# Patient Record
Sex: Female | Born: 1965 | Race: White | Hispanic: No | Marital: Single | Smoking: Never smoker
Health system: Southern US, Community
[De-identification: ages and names within clinical notes are randomized; demographics above are authoritative.]

## PROBLEM LIST (undated history)

## (undated) DIAGNOSIS — Z8719 Personal history of other diseases of the digestive system: Secondary | ICD-10-CM

## (undated) DIAGNOSIS — R0602 Shortness of breath: Secondary | ICD-10-CM

## (undated) DIAGNOSIS — T4145XA Adverse effect of unspecified anesthetic, initial encounter: Secondary | ICD-10-CM

## (undated) DIAGNOSIS — F329 Major depressive disorder, single episode, unspecified: Secondary | ICD-10-CM

## (undated) DIAGNOSIS — G4730 Sleep apnea, unspecified: Secondary | ICD-10-CM

## (undated) DIAGNOSIS — F32A Depression, unspecified: Secondary | ICD-10-CM

## (undated) DIAGNOSIS — Z87442 Personal history of urinary calculi: Secondary | ICD-10-CM

## (undated) DIAGNOSIS — R011 Cardiac murmur, unspecified: Secondary | ICD-10-CM

## (undated) DIAGNOSIS — R112 Nausea with vomiting, unspecified: Secondary | ICD-10-CM

## (undated) DIAGNOSIS — K219 Gastro-esophageal reflux disease without esophagitis: Secondary | ICD-10-CM

## (undated) DIAGNOSIS — N189 Chronic kidney disease, unspecified: Secondary | ICD-10-CM

## (undated) DIAGNOSIS — E119 Type 2 diabetes mellitus without complications: Secondary | ICD-10-CM

## (undated) DIAGNOSIS — I10 Essential (primary) hypertension: Secondary | ICD-10-CM

## (undated) DIAGNOSIS — J45909 Unspecified asthma, uncomplicated: Secondary | ICD-10-CM

## (undated) DIAGNOSIS — D649 Anemia, unspecified: Secondary | ICD-10-CM

## (undated) DIAGNOSIS — Z9889 Other specified postprocedural states: Secondary | ICD-10-CM

## (undated) DIAGNOSIS — M1990 Unspecified osteoarthritis, unspecified site: Secondary | ICD-10-CM

## (undated) DIAGNOSIS — T8859XA Other complications of anesthesia, initial encounter: Secondary | ICD-10-CM

## (undated) DIAGNOSIS — F419 Anxiety disorder, unspecified: Secondary | ICD-10-CM

## (undated) HISTORY — PX: DILATION AND CURETTAGE OF UTERUS: SHX78

## (undated) HISTORY — PX: SHOULDER ARTHROSCOPY WITH ROTATOR CUFF REPAIR: SHX5685

## (undated) HISTORY — PX: BREAST SURGERY: SHX581

## (undated) HISTORY — PX: NISSEN FUNDOPLICATION: SHX2091

## (undated) HISTORY — PX: OTHER SURGICAL HISTORY: SHX169

## (undated) HISTORY — PX: CATARACT EXTRACTION: SUR2

## (undated) HISTORY — PX: ABDOMINAL HYSTERECTOMY: SHX81

## (undated) HISTORY — PX: WRIST SURGERY: SHX841

## (undated) HISTORY — PX: NASAL SINUS SURGERY: SHX719

---

## 2011-11-15 HISTORY — PX: SEPTOPLASTY: SUR1290

## 2013-10-14 NOTE — H&P (Signed)
History of Present Illness The patient is a 47 year old female who presents today for follow up of their back. The patient is being followed for their back pain. They are now 2 week(s) out from bilateral L3, L4 med branch block. Symptoms reported today include: pain, aching and weakness, while the patient does not report symptoms of: urinary incontinence. The patient states that they are doing poorly. The following medication has been used for pain control: none. The patient reports their current pain level to be 7 / 10. The patient has not gotten any relief of their symptoms with Cortisone injections.   Subjective Transcription  She returns today for follow up. The patient has had no significant relief of her symptoms with the recent medial branch bundle block. The only thing that gave her temporary relief was the facet injection done in September 2014. She continues to have severe extension related back pain and it is now beginning to affect her forward flexion. She has increasing left radicular neuropathic leg pain compared to the right.  Allergies Codeine Phosphate *ANALGESICS - OPIOID*    Social History Tobacco / smoke exposure. no Pain Contract. no Drug/Alcohol Rehab (Previously). no Tobacco use. Never smoker. never smoker Alcohol use. never consumed alcohol Children. 0 Exercise. Exercises rarely; does running / walking Number of flights of stairs before winded. greater than 5 Illicit drug use. no Living situation. live alone Current work status. working full time Drug/Alcohol Rehab (Currently). no Marital status. divorced    Medication History Wellbutrin XL (300MG  Tablet ER 24HR, Oral) Active. (qd) Clorazepate Dipotassium (15MG  Tablet, Oral) Active. (qd) Ambien ( Oral) Specific dose unknown - Active. Xanax ( Oral) Specific dose unknown - Active. Losartan Potassium ( Oral) Specific dose unknown - Active. (qd) Spironolactone-HCTZ ( Oral)  Specific dose unknown - Active. (qd) Nasacort ( Nasal) Specific dose unknown - Active. (prn) Furosemide ( Oral) Specific dose unknown - Active. (bid) Domperidone BP Specific dose unknown - Active. (bid) Zofran ( Oral) Specific dose unknown - Active. (2-3 qd prn) Glimepiride (2MG  Tablet, Oral) Active. (bid) Atenolol (50MG  Tablet, Oral) Active. (qd) Pepcid ( Oral) Specific dose unknown - Active. (qhs) Omeprazole (40MG  Capsule DR, Oral) Active. (qd) MetFORMIN HCl (500MG  Tablet, Oral) Active. (tid) Potassium Chloride ER ( Capsule ER, Oral) Active. (qd) OLANZapine (2.5MG  Tablet, Oral) Active. (qhs) Cymbalta (60MG  Capsule DR Part, Oral) Active. (qd) Singulair (10MG  Tablet, Oral) Active. (qd) Norvasc (10MG  Tablet, Oral) Active. (qd) Zantac (300MG  Tablet, Oral) Active. (qd) Medications Reconciled.    Past Surgical History Breast Biopsy. right, multiple times Other Orthopaedic Surgery    Other Problems High blood pressure Depression Other disease, cancer, significant illness Anxiety Disorder    Objective Transcription  She is alert. She is oriented times three. No shortness of breath or chest pain. The abdomen is soft, nontender. No history of incontinence of bowel or bladder. No hip, knee or ankle pain with joint range of motion. Negative SI joint tenderness. Negative Patrick's sign. She has exquisite pain in the mid portion of the low back especially with extension of the spine.    RADIOGRAPHS:  I have gone over her MRI from August again. 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 She has mild degenerative disease at L3-4 and L4-5, no evidence of significant collapse of the discs. There is also lateral recess stenosis secondary to the facet arthrosis left side again is more prominent than the right.     Assessments Transcription  At this point  in time the patient has had physical therapy, injection therapy, activity  modification, narcotic medications and her quality of life continues to deteriorate. She has had this ongoing problem now for almost five years and it has gotten progressively worse. At this point in time given the failure of conservative management I think it is reasonable to proceed with a facetectomy and decompression of that left side. This will allow debulking of the degenerated facet complex. I hesitate to recommend a fusion given the tall disc space. In the future she may require revision fusion but at this point I think by decompressing that lateral recess and taking down the majority of that facet joint so that it is not as symptomatic may be of benefit. I have discussed the risks with the patient which include infection, bleeding, nerve damage, death, stroke, paralysis, failure to heal, need for further surgery and ongoing or worse pain, loss of bowel and bladder control, CSF leak, need for fusion surgery in the future, no relief of symptoms. She is in agreement the risks and benefits and she would like to proceed with surgery. I will go ahead and set this up at her request.

## 2013-10-18 ENCOUNTER — Encounter (HOSPITAL_COMMUNITY): Payer: Self-pay | Admitting: Pharmacist

## 2013-10-21 ENCOUNTER — Encounter (HOSPITAL_COMMUNITY): Payer: Self-pay

## 2013-10-21 ENCOUNTER — Encounter (HOSPITAL_COMMUNITY)
Admission: RE | Admit: 2013-10-21 | Discharge: 2013-10-21 | Disposition: A | Discharge status: Home / Self Care | Payer: BC Managed Care – PPO | Source: Ambulatory Visit | Attending: Orthopedic Surgery | Admitting: Orthopedic Surgery

## 2013-10-21 HISTORY — DX: Depression, unspecified: F32.A

## 2013-10-21 HISTORY — DX: Essential (primary) hypertension: I10

## 2013-10-21 HISTORY — DX: Adverse effect of unspecified anesthetic, initial encounter: T41.45XA

## 2013-10-21 HISTORY — DX: Chronic kidney disease, unspecified: N18.9

## 2013-10-21 HISTORY — DX: Major depressive disorder, single episode, unspecified: F32.9

## 2013-10-21 HISTORY — DX: Other complications of anesthesia, initial encounter: T88.59XA

## 2013-10-21 HISTORY — DX: Cardiac murmur, unspecified: R01.1

## 2013-10-21 HISTORY — DX: Shortness of breath: R06.02

## 2013-10-21 HISTORY — DX: Sleep apnea, unspecified: G47.30

## 2013-10-21 HISTORY — DX: Anxiety disorder, unspecified: F41.9

## 2013-10-21 HISTORY — DX: Other specified postprocedural states: Z98.890

## 2013-10-21 HISTORY — DX: Gastro-esophageal reflux disease without esophagitis: K21.9

## 2013-10-21 HISTORY — DX: Nausea with vomiting, unspecified: R11.2

## 2013-10-21 LAB — SURGICAL PCR SCREEN: MRSA, PCR: POSITIVE — AB

## 2013-10-21 LAB — CBC
HCT: 39.8 % (ref 36.0–46.0)
Hemoglobin: 13.3 g/dL (ref 12.0–15.0)
MCHC: 33.4 g/dL (ref 30.0–36.0)
Platelets: 382 10*3/uL (ref 150–400)
RBC: 4.72 MIL/uL (ref 3.87–5.11)

## 2013-10-21 LAB — BASIC METABOLIC PANEL
BUN: 8 mg/dL (ref 6–23)
Calcium: 9.2 mg/dL (ref 8.4–10.5)
GFR calc Af Amer: >90 mL/min (ref >90)
GFR calc non Af Amer: 80 mL/min — ABNORMAL LOW (ref >90)
Potassium: 3.7 mEq/L (ref 3.5–5.1)
Sodium: 136 mEq/L (ref 135–145)

## 2013-10-21 NOTE — Pre-Procedure Instructions (Signed)
Rickeya Manus  10/21/2013   Your procedure is scheduled on:  Wednesday, December 10th.  Report to [facility], Main Entrance/ntrance "A" at 8:00  Call this number if you have problems the morning of surgery: [phone]   Remember:   Do not eat food or drink liquids after midnight Tuesday.  Take these medicines the morning of surgery with A SIP OF WATER: Wellbutrin, Cymbalta, Omeprazole.  May take Zofran if needed.   Do not wear jewelry, make-up or nail polish.  Do not wear lotions, powders, or perfumes. You may wear deodorant.  Do not shave 48 hours prior to surgery.   Do not bring valuables to the hospital.  [facility] is not responsible  for any belongings or valuables.               Contacts, dentures or bridgework may not be worn into surgery.  Leave suitcase in the car. After surgery it may be brought to your room.  For patients admitted to the hospital, discharge time is determined by your  treatment team.               Patients discharged the day of surgery will not be allowed to drive home.  Name and phone number of your driver: -   Special Instructions: Shower using CHG 2 nights before surgery and the night before surgery.  If you shower the day of surgery use CHG.  Use special wash - you have one bottle of CHG for all showers.  You should use approximately 1/3 of the bottle for each shower.   Please read over the following fact sheets that you were given: Pain Booklet, Coughing and Deep Breathing and Surgical Site Infection Prevention

## 2013-10-21 NOTE — Progress Notes (Signed)
Pt has a heart murmer and is followed by Dr Earlene Plater in [place].Pt has sleep apnea and is followed by [facility].  Pt has IGA nephro and see [facility].  I faxed a request for office notes , labs, xrays and cardiac studies.

## 2013-10-22 MED ORDER — CEFAZOLIN SODIUM-DEXTROSE 2-3 GM-% IV SOLR
2.0000 g | INTRAVENOUS | Status: AC
  Administered 2013-10-23: 2 g via INTRAVENOUS

## 2013-10-23 ENCOUNTER — Ambulatory Visit (HOSPITAL_COMMUNITY): Payer: BC Managed Care – PPO

## 2013-10-23 ENCOUNTER — Encounter (HOSPITAL_COMMUNITY)
Admission: RE | Disposition: A | Discharge status: Home / Self Care | Payer: Self-pay | Source: Ambulatory Visit | Attending: Orthopedic Surgery

## 2013-10-23 ENCOUNTER — Ambulatory Visit (HOSPITAL_COMMUNITY): Payer: BC Managed Care – PPO | Admitting: Anesthesiology

## 2013-10-23 ENCOUNTER — Encounter (HOSPITAL_COMMUNITY): Payer: Self-pay | Admitting: *Deleted

## 2013-10-23 ENCOUNTER — Encounter (HOSPITAL_COMMUNITY): Payer: BC Managed Care – PPO | Admitting: Anesthesiology

## 2013-10-23 ENCOUNTER — Observation Stay (HOSPITAL_COMMUNITY)
Admission: RE | Admit: 2013-10-23 | Discharge: 2013-10-24 | Disposition: A | Discharge status: Home / Self Care | Payer: BC Managed Care – PPO | Source: Ambulatory Visit | Attending: Orthopedic Surgery | Admitting: Orthopedic Surgery

## 2013-10-23 DIAGNOSIS — I129 Hypertensive chronic kidney disease with stage 1 through stage 4 chronic kidney disease, or unspecified chronic kidney disease: Secondary | ICD-10-CM | POA: Insufficient documentation

## 2013-10-23 DIAGNOSIS — N189 Chronic kidney disease, unspecified: Secondary | ICD-10-CM | POA: Insufficient documentation

## 2013-10-23 DIAGNOSIS — M549 Dorsalgia, unspecified: Secondary | ICD-10-CM | POA: Diagnosis present

## 2013-10-23 DIAGNOSIS — G4730 Sleep apnea, unspecified: Secondary | ICD-10-CM | POA: Insufficient documentation

## 2013-10-23 DIAGNOSIS — Z9889 Other specified postprocedural states: Secondary | ICD-10-CM

## 2013-10-23 DIAGNOSIS — E119 Type 2 diabetes mellitus without complications: Secondary | ICD-10-CM | POA: Insufficient documentation

## 2013-10-23 DIAGNOSIS — K219 Gastro-esophageal reflux disease without esophagitis: Secondary | ICD-10-CM | POA: Insufficient documentation

## 2013-10-23 DIAGNOSIS — M48061 Spinal stenosis, lumbar region without neurogenic claudication: Principal | ICD-10-CM | POA: Insufficient documentation

## 2013-10-23 HISTORY — PX: LUMBAR LAMINECTOMY/DECOMPRESSION MICRODISCECTOMY: SHX5026

## 2013-10-23 LAB — GLUCOSE, CAPILLARY
Glucose-Capillary: 135 mg/dL — ABNORMAL HIGH (ref 70–99)
Glucose-Capillary: 144 mg/dL — ABNORMAL HIGH (ref 70–99)

## 2013-10-23 SURGERY — LUMBAR LAMINECTOMY/DECOMPRESSION MICRODISCECTOMY 1 LEVEL
Anesthesia: General

## 2013-10-23 MED ORDER — MENTHOL 3 MG MT LOZG
1.0000 | LOZENGE | OROMUCOSAL | Status: DC | PRN
  Filled 2013-10-23 (×2): qty 9

## 2013-10-23 MED ORDER — LACTATED RINGERS IV SOLN
INTRAVENOUS | Status: DC | PRN
  Administered 2013-10-23 (×2): via INTRAVENOUS

## 2013-10-23 MED ORDER — PROPOFOL 10 MG/ML IV BOLUS
INTRAVENOUS | Status: DC | PRN
  Administered 2013-10-23: 200 mg via INTRAVENOUS

## 2013-10-23 MED ORDER — ROCURONIUM BROMIDE 100 MG/10ML IV SOLN
INTRAVENOUS | Status: DC | PRN
  Administered 2013-10-23: 50 mg via INTRAVENOUS

## 2013-10-23 MED ORDER — LIDOCAINE HCL (CARDIAC) 20 MG/ML IV SOLN
INTRAVENOUS | Status: DC | PRN
  Administered 2013-10-23: 100 mg via INTRAVENOUS

## 2013-10-23 MED ORDER — LACTATED RINGERS IV SOLN
INTRAVENOUS | Status: DC
  Administered 2013-10-23 – 2013-10-24 (×2): via INTRAVENOUS

## 2013-10-23 MED ORDER — NEOSTIGMINE METHYLSULFATE 1 MG/ML IJ SOLN
INTRAMUSCULAR | Status: DC | PRN
  Administered 2013-10-23: 5 mg via INTRAVENOUS

## 2013-10-23 MED ORDER — GLYCOPYRROLATE 0.2 MG/ML IJ SOLN
INTRAMUSCULAR | Status: DC | PRN
  Administered 2013-10-23: 0.6 mg via INTRAVENOUS

## 2013-10-23 MED ORDER — METHOCARBAMOL 500 MG PO TABS
500.0000 mg | ORAL_TABLET | Freq: Four times a day (QID) | ORAL | Status: DC | PRN
  Administered 2013-10-23 – 2013-10-24 (×3): 500 mg via ORAL
  Filled 2013-10-23 (×4): qty 1

## 2013-10-23 MED ORDER — DEXAMETHASONE SODIUM PHOSPHATE 4 MG/ML IJ SOLN
4.0000 mg | Freq: Once | INTRAMUSCULAR | Status: DC
  Filled 2013-10-23: qty 1

## 2013-10-23 MED ORDER — SODIUM CHLORIDE 0.9 % IJ SOLN: 3.0000 mL | INTRAMUSCULAR | Status: DC | PRN

## 2013-10-23 MED ORDER — SODIUM CHLORIDE 0.9 % IV SOLN: 250.0000 mL | INTRAVENOUS | Status: DC

## 2013-10-23 MED ORDER — METHOCARBAMOL 100 MG/ML IJ SOLN
500.0000 mg | Freq: Four times a day (QID) | INTRAVENOUS | Status: DC | PRN
  Filled 2013-10-23: qty 5

## 2013-10-23 MED ORDER — LABETALOL HCL 5 MG/ML IV SOLN
INTRAVENOUS | Status: DC | PRN
  Administered 2013-10-23: 10 mg via INTRAVENOUS
  Administered 2013-10-23 (×2): 5 mg via INTRAVENOUS

## 2013-10-23 MED ORDER — DEXAMETHASONE SODIUM PHOSPHATE 4 MG/ML IJ SOLN
4.0000 mg | Freq: Four times a day (QID) | INTRAMUSCULAR | Status: DC
  Filled 2013-10-23 (×3): qty 1

## 2013-10-23 MED ORDER — CEFAZOLIN SODIUM 1-5 GM-% IV SOLN
1.0000 g | Freq: Three times a day (TID) | INTRAVENOUS | Status: AC
  Administered 2013-10-23 – 2013-10-24 (×2): 1 g via INTRAVENOUS
  Filled 2013-10-23 (×2): qty 50

## 2013-10-23 MED ORDER — OLANZAPINE 2.5 MG PO TABS
2.5000 mg | ORAL_TABLET | Freq: Every day | ORAL | Status: DC
  Administered 2013-10-23: 2.5 mg via ORAL
  Filled 2013-10-23 (×2): qty 1

## 2013-10-23 MED ORDER — VALSARTAN-HYDROCHLOROTHIAZIDE 160-12.5 MG PO TABS: 1.0000 | ORAL_TABLET | Freq: Every day | ORAL | Status: DC

## 2013-10-23 MED ORDER — POTASSIUM CHLORIDE CRYS ER 10 MEQ PO TBCR
10.0000 meq | EXTENDED_RELEASE_TABLET | Freq: Every day | ORAL | Status: DC
  Administered 2013-10-24: 10 meq via ORAL
  Filled 2013-10-23: qty 1

## 2013-10-23 MED ORDER — METFORMIN HCL 500 MG PO TABS
500.0000 mg | ORAL_TABLET | Freq: Three times a day (TID) | ORAL | Status: DC
  Administered 2013-10-23 – 2013-10-24 (×3): 500 mg via ORAL
  Filled 2013-10-23 (×5): qty 1

## 2013-10-23 MED ORDER — BUPROPION HCL ER (XL) 300 MG PO TB24
300.0000 mg | ORAL_TABLET | Freq: Every day | ORAL | Status: DC
  Administered 2013-10-24: 300 mg via ORAL
  Filled 2013-10-23: qty 1

## 2013-10-23 MED ORDER — OXYCODONE HCL 5 MG PO TABS
ORAL_TABLET | ORAL | Status: AC
  Filled 2013-10-23: qty 2

## 2013-10-23 MED ORDER — FUROSEMIDE 20 MG PO TABS
20.0000 mg | ORAL_TABLET | Freq: Two times a day (BID) | ORAL | Status: DC
  Administered 2013-10-23 – 2013-10-24 (×3): 20 mg via ORAL
  Filled 2013-10-23 (×4): qty 1

## 2013-10-23 MED ORDER — OXYCODONE HCL 5 MG PO TABS
10.0000 mg | ORAL_TABLET | ORAL | Status: DC | PRN
  Administered 2013-10-23 – 2013-10-24 (×6): 10 mg via ORAL
  Filled 2013-10-23 (×6): qty 2

## 2013-10-23 MED ORDER — DEXAMETHASONE 4 MG PO TABS
4.0000 mg | ORAL_TABLET | Freq: Four times a day (QID) | ORAL | Status: DC
  Administered 2013-10-24 (×3): 4 mg via ORAL
  Filled 2013-10-23 (×6): qty 1

## 2013-10-23 MED ORDER — DULOXETINE HCL 60 MG PO CPEP
60.0000 mg | ORAL_CAPSULE | Freq: Every day | ORAL | Status: DC
  Administered 2013-10-24: 60 mg via ORAL
  Filled 2013-10-23: qty 1

## 2013-10-23 MED ORDER — ONDANSETRON HCL 4 MG/2ML IJ SOLN: 4.0000 mg | INTRAMUSCULAR | Status: DC | PRN

## 2013-10-23 MED ORDER — SODIUM CHLORIDE 0.9 % IJ SOLN: 3.0000 mL | Freq: Two times a day (BID) | INTRAMUSCULAR | Status: DC

## 2013-10-23 MED ORDER — BUPIVACAINE-EPINEPHRINE 0.25% -1:200000 IJ SOLN
INTRAMUSCULAR | Status: DC | PRN
  Administered 2013-10-23: 10 mL

## 2013-10-23 MED ORDER — ONDANSETRON HCL 4 MG/2ML IJ SOLN: 4.0000 mg | Freq: Once | INTRAMUSCULAR | Status: DC | PRN

## 2013-10-23 MED ORDER — DEXAMETHASONE SODIUM PHOSPHATE 4 MG/ML IJ SOLN
4.0000 mg | Freq: Four times a day (QID) | INTRAMUSCULAR | Status: DC
  Filled 2013-10-23 (×6): qty 1

## 2013-10-23 MED ORDER — ACETAMINOPHEN 10 MG/ML IV SOLN
1000.0000 mg | Freq: Four times a day (QID) | INTRAVENOUS | Status: DC
  Administered 2013-10-23: 1000 mg via INTRAVENOUS
  Filled 2013-10-23 (×2): qty 100

## 2013-10-23 MED ORDER — HYDROCHLOROTHIAZIDE 12.5 MG PO CAPS
12.5000 mg | ORAL_CAPSULE | Freq: Every day | ORAL | Status: DC
  Administered 2013-10-23 – 2013-10-24 (×2): 12.5 mg via ORAL
  Filled 2013-10-23 (×2): qty 1

## 2013-10-23 MED ORDER — METHOCARBAMOL 500 MG PO TABS
ORAL_TABLET | ORAL | Status: AC
  Filled 2013-10-23: qty 1

## 2013-10-23 MED ORDER — PHENOL 1.4 % MT LIQD: 1.0000 | OROMUCOSAL | Status: DC | PRN

## 2013-10-23 MED ORDER — CEFAZOLIN SODIUM-DEXTROSE 2-3 GM-% IV SOLR: 2.0000 g | INTRAVENOUS | Status: DC

## 2013-10-23 MED ORDER — ACETAMINOPHEN 10 MG/ML IV SOLN
1000.0000 mg | Freq: Four times a day (QID) | INTRAVENOUS | Status: DC
  Administered 2013-10-23 – 2013-10-24 (×3): 1000 mg via INTRAVENOUS
  Filled 2013-10-23 (×4): qty 100

## 2013-10-23 MED ORDER — ONDANSETRON HCL 4 MG/2ML IJ SOLN
INTRAMUSCULAR | Status: DC | PRN
  Administered 2013-10-23: 4 mg via INTRAVENOUS

## 2013-10-23 MED ORDER — DEXAMETHASONE 4 MG PO TABS
4.0000 mg | ORAL_TABLET | Freq: Four times a day (QID) | ORAL | Status: DC
  Filled 2013-10-23 (×3): qty 1

## 2013-10-23 MED ORDER — GLIMEPIRIDE 1 MG PO TABS
1.0000 mg | ORAL_TABLET | Freq: Three times a day (TID) | ORAL | Status: DC
  Administered 2013-10-23 – 2013-10-24 (×3): 1 mg via ORAL
  Filled 2013-10-23 (×5): qty 1

## 2013-10-23 MED ORDER — BUPIVACAINE-EPINEPHRINE (PF) 0.25% -1:200000 IJ SOLN
INTRAMUSCULAR | Status: AC
  Filled 2013-10-23: qty 30

## 2013-10-23 MED ORDER — HYDROMORPHONE HCL PF 1 MG/ML IJ SOLN
0.2500 mg | INTRAMUSCULAR | Status: DC | PRN
  Administered 2013-10-23 (×2): 0.5 mg via INTRAVENOUS

## 2013-10-23 MED ORDER — THROMBIN 20000 UNITS EX SOLR
CUTANEOUS | Status: DC | PRN
  Administered 2013-10-23: 12:00:00 via TOPICAL

## 2013-10-23 MED ORDER — DEXAMETHASONE SODIUM PHOSPHATE 4 MG/ML IJ SOLN
4.0000 mg | Freq: Once | INTRAMUSCULAR | Status: AC
  Administered 2013-10-23: 4 mg via INTRAVENOUS
  Filled 2013-10-23: qty 1

## 2013-10-23 MED ORDER — LACTATED RINGERS IV SOLN
INTRAVENOUS | Status: DC
  Administered 2013-10-23: 09:00:00 via INTRAVENOUS

## 2013-10-23 MED ORDER — THROMBIN 20000 UNITS EX SOLR
CUTANEOUS | Status: AC
  Filled 2013-10-23: qty 20000

## 2013-10-23 MED ORDER — AMLODIPINE BESYLATE 10 MG PO TABS
10.0000 mg | ORAL_TABLET | Freq: Every day | ORAL | Status: DC
  Administered 2013-10-23 – 2013-10-24 (×2): 10 mg via ORAL
  Filled 2013-10-23 (×2): qty 1

## 2013-10-23 MED ORDER — HYDROMORPHONE HCL PF 1 MG/ML IJ SOLN
INTRAMUSCULAR | Status: AC
  Filled 2013-10-23: qty 1

## 2013-10-23 MED ORDER — SUFENTANIL CITRATE 50 MCG/ML IV SOLN
INTRAVENOUS | Status: DC | PRN
  Administered 2013-10-23: 30 ug via INTRAVENOUS
  Administered 2013-10-23: 10 ug via INTRAVENOUS
  Administered 2013-10-23: 20 ug via INTRAVENOUS

## 2013-10-23 MED ORDER — ZOLPIDEM TARTRATE 5 MG PO TABS: 5.0000 mg | ORAL_TABLET | Freq: Every evening | ORAL | Status: DC | PRN

## 2013-10-23 MED ORDER — IRBESARTAN 150 MG PO TABS
150.0000 mg | ORAL_TABLET | Freq: Every day | ORAL | Status: DC
  Administered 2013-10-23 – 2013-10-24 (×2): 150 mg via ORAL
  Filled 2013-10-23 (×2): qty 1

## 2013-10-23 SURGICAL SUPPLY — 56 items
BANDAGE GAUZE ELAST BULKY 4 IN (GAUZE/BANDAGES/DRESSINGS) ×2 IMPLANT
BUR EGG ELITE 4.0 (BURR) IMPLANT
BUR MATCHSTICK NEURO 3.0 LAGG (BURR) IMPLANT
CANISTER SUCTION 2500CC (MISCELLANEOUS) ×2 IMPLANT
CLOTH BEACON ORANGE TIMEOUT ST (SAFETY) ×2 IMPLANT
CLSR STERI-STRIP ANTIMIC 1/2X4 (GAUZE/BANDAGES/DRESSINGS) ×2 IMPLANT
CORDS BIPOLAR (ELECTRODE) ×2 IMPLANT
COVER SURGICAL LIGHT HANDLE (MISCELLANEOUS) ×2 IMPLANT
DRAIN CHANNEL 15F RND FF W/TCR (WOUND CARE) ×2 IMPLANT
DRAPE POUCH INSTRU U-SHP 10X18 (DRAPES) ×2 IMPLANT
DRAPE SURG 17X23 STRL (DRAPES) ×2 IMPLANT
DRAPE U-SHAPE 47X51 STRL (DRAPES) ×2 IMPLANT
DRSG MEPILEX BORDER 4X4 (GAUZE/BANDAGES/DRESSINGS) ×2 IMPLANT
DRSG MEPILEX BORDER 4X8 (GAUZE/BANDAGES/DRESSINGS) ×2 IMPLANT
DURAPREP 26ML APPLICATOR (WOUND CARE) ×2 IMPLANT
ELECT BLADE 4.0 EZ CLEAN MEGAD (MISCELLANEOUS)
ELECT CAUTERY BLADE 6.4 (BLADE) ×2 IMPLANT
ELECT REM PT RETURN 9FT ADLT (ELECTROSURGICAL) ×2
ELECTRODE BLDE 4.0 EZ CLN MEGD (MISCELLANEOUS) IMPLANT
ELECTRODE REM PT RTRN 9FT ADLT (ELECTROSURGICAL) ×1 IMPLANT
EVACUATOR SILICONE 100CC (DRAIN) ×2 IMPLANT
GLOVE BIOGEL PI IND STRL 8 (GLOVE) ×1 IMPLANT
GLOVE BIOGEL PI IND STRL 8.5 (GLOVE) ×1 IMPLANT
GLOVE BIOGEL PI INDICATOR 8 (GLOVE) ×1
GLOVE BIOGEL PI INDICATOR 8.5 (GLOVE) ×1
GLOVE ECLIPSE 8.5 STRL (GLOVE) ×2 IMPLANT
GLOVE ORTHO TXT STRL SZ7.5 (GLOVE) ×2 IMPLANT
GOWN PREVENTION PLUS XXLARGE (GOWN DISPOSABLE) ×2 IMPLANT
GOWN STRL NON-REIN LRG LVL3 (GOWN DISPOSABLE) ×2 IMPLANT
GOWN STRL REIN 2XL XLG LVL4 (GOWN DISPOSABLE) ×2 IMPLANT
GOWN STRL REIN XL XLG (GOWN DISPOSABLE) ×4 IMPLANT
KIT BASIN OR (CUSTOM PROCEDURE TRAY) ×2 IMPLANT
KIT ROOM TURNOVER OR (KITS) ×2 IMPLANT
NEEDLE 22X1 1/2 (OR ONLY) (NEEDLE) ×2 IMPLANT
NEEDLE SPNL 18GX3.5 QUINCKE PK (NEEDLE) ×4 IMPLANT
NS IRRIG 1000ML POUR BTL (IV SOLUTION) ×2 IMPLANT
PACK LAMINECTOMY ORTHO (CUSTOM PROCEDURE TRAY) ×2 IMPLANT
PACK UNIVERSAL I (CUSTOM PROCEDURE TRAY) ×2 IMPLANT
PAD ARMBOARD 7.5X6 YLW CONV (MISCELLANEOUS) ×4 IMPLANT
PATTIES SURGICAL .5 X.5 (GAUZE/BANDAGES/DRESSINGS) IMPLANT
PATTIES SURGICAL .5 X1 (DISPOSABLE) ×2 IMPLANT
SPONGE SURGIFOAM ABS GEL 100 (HEMOSTASIS) IMPLANT
SURGIFLO TRUKIT (HEMOSTASIS) ×2 IMPLANT
SUT MON AB 3-0 SH 27 (SUTURE) ×1
SUT MON AB 3-0 SH27 (SUTURE) ×1 IMPLANT
SUT VIC AB 0 CT1 27 (SUTURE) ×2
SUT VIC AB 0 CT1 27XBRD ANBCTR (SUTURE) ×2 IMPLANT
SUT VIC AB 1 CTX 36 (SUTURE) ×2
SUT VIC AB 1 CTX36XBRD ANBCTR (SUTURE) ×2 IMPLANT
SUT VIC AB 2-0 CT1 18 (SUTURE) ×2 IMPLANT
SYR BULB IRRIGATION 50ML (SYRINGE) ×2 IMPLANT
SYR CONTROL 10ML LL (SYRINGE) ×2 IMPLANT
TOWEL OR 17X24 6PK STRL BLUE (TOWEL DISPOSABLE) ×2 IMPLANT
TOWEL OR 17X26 10 PK STRL BLUE (TOWEL DISPOSABLE) ×2 IMPLANT
WATER STERILE IRR 1000ML POUR (IV SOLUTION) ×2 IMPLANT
YANKAUER SUCT BULB TIP NO VENT (SUCTIONS) ×2 IMPLANT

## 2013-10-23 NOTE — Anesthesia Procedure Notes (Signed)
Procedure Name: Intubation Date/Time: 10/23/2013 11:05 AM Performed by: Coralee Rud Pre-anesthesia Checklist: Patient identified, Emergency Drugs available, Suction available, Patient being monitored and Timeout performed Patient Re-evaluated:Patient Re-evaluated prior to inductionOxygen Delivery Method: Circle system utilized Preoxygenation: Pre-oxygenation with 100% oxygen Intubation Type: IV induction Ventilation: Mask ventilation without difficulty Laryngoscope Size: Miller and 3 Grade View: Grade I Tube type: Oral Tube size: 7.5 mm Airway Equipment and Method: Stylet Placement Confirmation: ETT inserted through vocal cords under direct vision and positive ETCO2 Secured at: 21 cm Tube secured with: Tape Dental Injury: Teeth and Oropharynx as per pre-operative assessment

## 2013-10-23 NOTE — Brief Op Note (Signed)
212/08/2013  12:53 PM  PATIENT:  [name]  47 y.o. female  PRE-OPERATIVE DIAGNOSIS:  facet arthrosis  POST-OPERATIVE DIAGNOSIS:  facet arthrosis  PROCEDURE:  Procedure(s): DECOMPRESSION AND FACET REMOVAL L4 - L5 1 LEVEL (N/A)  SURGEON:  Surgeon(s) and Role:    * Venita Lick, MD - Primary  PHYSICIAN ASSISTANT:   ASSISTANTS: Zonia Kief   ANESTHESIA:   general  EBL:  Total I/O In: 1000 [I.V.:1000] Out: 100 [Blood:100]  BLOOD ADMINISTERED:none  DRAINS: none   LOCAL MEDICATIONS USED:  MARCAINE     SPECIMEN:  No Specimen  DISPOSITION OF SPECIMEN:  N/A  COUNTS:  YES  TOURNIQUET:  * No tourniquets in log *  DICTATION: .Other Dictation: Dictation Number [id number]  PLAN OF CARE: Admit for overnight observation  PATIENT DISPOSITION:  PACU - hemodynamically stable.

## 2013-10-23 NOTE — Progress Notes (Signed)
Orthopedic Tech Progress Note Patient Details:  [name] 1966-01-01 [id number]  Patient ID: [name], female   DOB: 1965/12/29, 47 y.o.   MRN: [id number]   [name] 10/23/2013, 4:38 [facility] advanced for lumbar corset.

## 2013-10-23 NOTE — Progress Notes (Signed)
Utilization review completed.  

## 2013-10-23 NOTE — Anesthesia Preprocedure Evaluation (Addendum)
Anesthesia Evaluation  Patient identified by MRN, date of birth, ID band Patient awake    Reviewed: Allergy & Precautions, H&P , NPO status , Patient's Chart, lab work & pertinent test results  History of Anesthesia Complications (+) PONV and history of anesthetic complications  Airway       Dental   Pulmonary sleep apnea ,          Cardiovascular hypertension,     Neuro/Psych Anxiety Depression    GI/Hepatic GERD-  ,  Endo/Other  diabetes, Type 2, Oral Hypoglycemic Agents  Renal/GU CRF and Renal InsufficiencyRenal disease     Musculoskeletal   Abdominal   Peds  Hematology   Anesthesia Other Findings   Reproductive/Obstetrics                          Anesthesia Physical Anesthesia Plan  ASA: III  Anesthesia Plan: General   Post-op Pain Management:    Induction: Intravenous  Airway Management Planned: Oral ETT  Additional Equipment:   Intra-op Plan:   Post-operative Plan: Extubation in OR  Informed Consent:   Plan Discussed with:   Anesthesia Plan Comments:         Anesthesia Quick Evaluation

## 2013-10-23 NOTE — H&P (Signed)
Ho change in clinical exam H+P reviewed

## 2013-10-23 NOTE — Plan of Care (Signed)
Problem: Consults Goal: Diagnosis - Spinal Surgery Decompression and Facet removal L4-L5

## 2013-10-23 NOTE — Preoperative (Signed)
Beta Blockers   Reason not to administer Beta Blockers:Not Applicable 

## 2013-10-23 NOTE — Transfer of Care (Signed)
Immediate Anesthesia Transfer of Care Note  Patient: [name]  Procedure(s) Performed: Procedure(s): DECOMPRESSION AND FACET REMOVAL L4 - L5 1 LEVEL (N/A)  Patient Location: PACU  Anesthesia Type:General  Level of Consciousness: awake and alert   Airway & Oxygen Therapy: Patient Spontanous Breathing and Patient connected to nasal cannula oxygen  Post-op Assessment: Report given to PACU RN, Post -op Vital signs reviewed and stable and Patient moving all extremities  Post vital signs: Reviewed and stable  Complications: No apparent anesthesia complications

## 2013-10-24 ENCOUNTER — Encounter (HOSPITAL_COMMUNITY): Payer: Self-pay | Admitting: General Practice

## 2013-10-24 LAB — GLUCOSE, CAPILLARY: Glucose-Capillary: 174 mg/dL — ABNORMAL HIGH (ref 70–99)

## 2013-10-24 MED ORDER — METHOCARBAMOL 500 MG PO TABS: 500.0000 mg | ORAL_TABLET | Freq: Four times a day (QID) | ORAL | Status: DC | PRN

## 2013-10-24 MED ORDER — OXYCODONE-ACETAMINOPHEN 7.5-325 MG PO TABS: 1.0000 | ORAL_TABLET | Freq: Four times a day (QID) | ORAL | Status: DC | PRN

## 2013-10-24 MED ORDER — POLYETHYLENE GLYCOL 3350 17 G PO PACK: 17.0000 g | PACK | Freq: Every day | ORAL | Status: DC

## 2013-10-24 MED ORDER — DOCUSATE SODIUM 100 MG PO CAPS: 100.0000 mg | ORAL_CAPSULE | Freq: Two times a day (BID) | ORAL | Status: DC

## 2013-10-24 NOTE — Evaluation (Signed)
Occupational Therapy Evaluation Patient Details Name: [name] MRN: [id number] DOB: Nov 25, 1965 Today's Date: 10/24/2013 Time: 0803-0906 OT Time Calculation (min): 63 min  OT Assessment / Plan / Recommendation History of present illness Spinal Surgery, L4-5 decompression and facet removal.   Clinical Impression   Pt is a 47y/o female admitted w/ above dx. Pt plans to d/c home later today w/ family PRN assist. She will benefit from [facility] to assess home safety/set-up & tub transfers. She has all necessary DME. Will sign off acute OT at this time.    OT Assessment  All further OT needs can be met in the next venue of care    Follow Up Recommendations  Home health OT;Supervision - Intermittent    Barriers to Discharge      Equipment Recommendations  None recommended by OT    Recommendations for Other Services    Frequency       Precautions / Restrictions Precautions Precautions: Fall;Back Precaution Booklet Issued: Yes (comment) Precaution Comments: reviewed back precautions and log rolling technique and provided handout Required Braces or Orthoses: Spinal Brace Spinal Brace: Lumbar corset;Applied in sitting position Restrictions Weight Bearing Restrictions: No   Pertinent Vitals/Pain 6/10 low back pain. Repositioned, pt had pain medication prior to assessment, Lumbar corset in sitting, rest.    ADL  Eating/Feeding: Performed;Independent Where Assessed - Eating/Feeding: Chair Grooming: Performed;Wash/dry hands;Wash/dry face;Teeth care;Modified independent Where Assessed - Grooming: Supported sitting Upper Body Bathing: Performed;Chest;Left arm;Right arm;Abdomen;Modified independent Where Assessed - Upper Body Bathing: Supported sitting Lower Body Bathing: Performed;Min guard Where Assessed - Lower Body Bathing: Supported sit to stand Upper Body Dressing: Performed;Set up Where Assessed - Upper Body Dressing: Supported sit to stand Lower Body Dressing: Performed;Min  guard Where Assessed - Lower Body Dressing: Supported sit to Pharmacist, hospital: Research scientist (life sciences) Method: Sit to Barista: Materials engineer and Hygiene: Performed;Supervision/safety Where Assessed - Engineer, mining and Hygiene: Sit to stand from 3-in-1 or toilet;Standing Tub/Shower Transfer: Simulated;Minimal assistance Tub/Shower Transfer Method: Science writer: Other (comment) (Pt has tub bench & shower seat w/ back, grab bar in tub) Equipment Used: Back brace;Gait belt;Other (comment) Transfers/Ambulation Related to ADLs: Pt overall supervision level transfers (toileting, to chair etc. Pt currently using RW, but may not need. Awaiting PT eval.) ADL Comments: Pt was educated in Role of OT. She peformed full ADL retraining session inculding bathing, dressing, back precautions reviewed/handout issued & pt implemented during ADL's, Pt overall supervision level for don/doffing back brace. Demonstrated good technique for LB dressing/bathing adhering to back precaustions w/ occassional vc's. Discussed dressing tech's w/ & w/o A/E, pt able to complete w/o a/e today given supervision& states she will have PRN assist at d/c. Pt requires increased time for all tasks related to ADL's and transfers noted. Simulated tub transfer, recommend pt attempt at home w/ HHOT to ensure follow through w/ back precautions & safety/set-up. Has all DME, no further acute OT needs at this time.     OT Diagnosis: Generalized weakness;Acute pain  OT Problem List: Decreased knowledge of precautions;Decreased knowledge of use of DME or AE;Pain OT Treatment Interventions:     OT Goals(Current goals can be found in the care plan section) Acute Rehab OT Goals Patient Stated Goal: D/c home later today  Visit Information  Last OT Received On: 10/24/13 Assistance Needed: +1 History of Present Illness:  Spinal Surgery, L4-5 decompression and facet removal.       Prior Functioning  Home Living Family/patient expects to be discharged to:: Private residence Living Arrangements: Other relatives Available Help at Discharge: Family;Available 24 hours/day Type of Home: House Home Access: Ramped entrance;Stairs to enter Entrance Stairs-Number of Steps: 5 Entrance Stairs-Rails: Right Home Layout: One level Home Equipment: Shower seat;Tub bench;Bedside commode Prior Function Level of Independence: Independent Communication Communication: No difficulties Dominant Hand: Right    Vision/Perception Vision - History Baseline Vision: Wears glasses all the time Visual History: Cataracts;Other (comment)   Cognition  Cognition Arousal/Alertness: Awake/alert Behavior During Therapy: WFL for tasks assessed/performed Overall Cognitive Status: Within Functional Limits for tasks assessed    Extremity/Trunk Assessment Upper Extremity Assessment Upper Extremity Assessment: Overall WFL for tasks assessed Lower Extremity Assessment Lower Extremity Assessment: Defer to PT evaluation    Mobility Bed Mobility Bed Mobility: Not assessed Details for Bed Mobility Assistance: Pt on 3:1 upon OT arrival Transfers Transfers: Sit to Stand;Stand to Sit Sit to Stand: 6: Modified independent (Device/Increase time);5: Supervision;From chair/3-in-1;With armrests Stand to Sit: 6: Modified independent (Device/Increase time);5: Supervision;To chair/3-in-1;With armrests Details for Transfer Assistance: Pt demonstrates good tech and adheres to back precautions during transfers.         Balance Balance Balance Assessed: Yes Static Sitting Balance Static Sitting - Balance Support: No upper extremity supported;Feet supported Static Standing Balance Static Standing - Balance Support: No upper extremity supported   End of Session OT - End of Session Equipment Utilized During Treatment: Gait belt;Rolling  walker;Back brace Activity Tolerance: Patient tolerated treatment well Patient left: in chair;with call bell/phone within reach;with nursing/sitter in room Nurse Communication: Mobility status  GO Functional Assessment Tool Used: Clinical judgement Functional Limitation: Self care Self Care Current Status (Z6109): At least 20 percent but less than 40 percent impaired, limited or restricted Self Care Goal Status (U0454): At least 1 percent but less than 20 percent impaired, limited or restricted Self Care Discharge Status [phone]): At least 20 percent but less than 40 percent impaired, limited or restricted   Alm Bustard 10/24/2013, 10:01 AM

## 2013-10-24 NOTE — Care Management Note (Signed)
CARE MANAGEMENT NOTE 10/24/2013  Patient:  [name]   Account Number:  [id number]  Date Initiated:  10/23/2013  Documentation initiated by:  Vance Peper  Subjective/Objective Assessment:   47 yr old female s/p decompression and facet removal L4-L5     Action/Plan:   PT/OT eval   Anticipated DC Date:  10/24/2013   Anticipated DC Plan:  HOME/SELF CARE      DC Planning Services  CM consult      PAC Choice  DURABLE MEDICAL EQUIPMENT   Choice offered to / List presented to:     DME arranged  Levan Hurst      DME agency  [facility]     HH arranged  NA      HH agency  NA   Status of service:  Completed, signed off  Discharge Disposition:  HOME/SELF CARE

## 2013-10-24 NOTE — Progress Notes (Signed)
Subjective: Doing well  Pain controlled.     Objective: Vital signs in last 24 hours: Temp:  [97.3 F (36.3 C)-98.2 F (36.8 C)] 97.6 F (36.4 C) (12/11 0601) Pulse Rate:  [80-99] 86 (12/11 0601) Resp:  [9-20] 18 (12/11 0601) BP: (113-163)/(54-80) 113/54 mmHg (12/11 0601) SpO2:  [90 %-98 %] 93 % (12/11 0601)  Intake/Output from previous day: 12/10 0701 - 12/11 0700 In: 2617.2 [P.O.:400; I.V.:2017.2; IV Piggyback:200] Out: 550 [Urine:450; Blood:100] Intake/Output this shift:     Recent Labs  10/21/13 1458  HGB 13.3    Recent Labs  10/21/13 1458  WBC 8.9  RBC 4.72  HCT 39.8  PLT 382    Recent Labs  10/21/13 1458  NA 136  K 3.7  CL 97  CO2 29  BUN 8  CREATININE 0.84  GLUCOSE 98  CALCIUM 9.2   No results found for this basename: LABPT, INR,  in the last 72 hours  Neurologically intact Neurovascular intact Dorsiflexion/Plantar flexion intact Compartment soft  Assessment/Plan: Anticipate d/c home today if moves well with PT.  F/u 2 weeks postop.  Scripts on chart.     Bridgette Wolden M 10/24/2013, 8:18 AM

## 2013-10-24 NOTE — Evaluation (Signed)
Physical Therapy Evaluation Patient Details Name: [name] MRN: [id number] DOB: Jan 24, 1966 Today's Date: 10/24/2013 Time: 0922-1006 PT Time Calculation (min): 44 min  PT Assessment / Plan / Recommendation History of Present Illness  Spinal Surgery, L4-5 decompression and facet removal.  Clinical Impression  Pt. Presents to PT with a decrease in her usual level of mobility and function following back surgery but at mod I to supervision levels.  She is Dcing home today and will need RW to stabilize her and maximize her safety.  All education completed and pt. willl have 24 hour assist at home.  She did become fatigued on retrun walking trip back to room , felt hot and became red faced until she sat down again.  This was probably due to pain med.  Overall , she appears ready for DC with 24 hour assist.  Will sign off as Dc orders written.    PT Assessment  Patent does not need any further PT services    Follow Up Recommendations  No PT follow up;Supervision/Assistance - 24 hour;Supervision for mobility/OOB    Does the patient have the potential to tolerate intense rehabilitation      Barriers to Discharge        Equipment Recommendations  Rolling walker with 5" wheels    Recommendations for Other Services     Frequency      Precautions / Restrictions Precautions Precautions: Fall;Back Precaution Booklet Issued: Yes (comment) Precaution Comments: reviewed back precautions and log rolling technique and provided handout Required Braces or Orthoses: Spinal Brace Spinal Brace: Lumbar corset;Applied in sitting position Restrictions Weight Bearing Restrictions: No   Pertinent Vitals/Pain See vitals tab       Mobility  Bed Mobility Bed Mobility: Not assessed Details for Bed Mobility Assistance: Pt on 3:1 upon OT arrival Transfers Transfers: Sit to Stand;Stand to Sit Sit to Stand: 6: Modified independent (Device/Increase time);5: Supervision;From chair/3-in-1;With  armrests Stand to Sit: 6: Modified independent (Device/Increase time);5: Supervision;To chair/3-in-1;With armrests Details for Transfer Assistance: Pt demonstrates good tech and adheres to back precautions during transfers.  Ambulation/Gait Ambulation/Gait Assistance: 5: Supervision Ambulation Distance (Feet): 150 Feet (100' then 50'.  Had top ride back in relciner d/t weakness) Assistive device: Rolling walker Ambulation/Gait Assistance Details: Pt. needed several standing rest breaks along the way due to fatigue.  She was able to make it about half way back to room and became red in the face and neck and needed to sit in desk chair in hallway.  She was then transported back to room in her recliner chair.  Pt. feeling much better once seated in recliner.   Gait Pattern: Step-through pattern;Decreased stride length Gait velocity: decreased slightly Stairs: Yes Stairs Assistance: 4: Min guard Stair Management Technique: One rail Right;Forwards Number of Stairs: 5 (x2)    Exercises     PT Diagnosis:    PT Problem List:   PT Treatment Interventions:       PT Goals(Current goals can be found in the care plan section) Acute Rehab PT Goals Patient Stated Goal: D/c home later today  Visit Information  Last PT Received On: 10/24/13 Assistance Needed: +1 History of Present Illness: Spinal Surgery, L4-5 decompression and facet removal.       Prior Functioning  Home Living Family/patient expects to be discharged to:: Private residence Living Arrangements: Other relatives Available Help at Discharge: Family;Available 24 hours/day Type of Home: House Home Access: Ramped entrance;Stairs to enter Entrance Stairs-Number of Steps: 5 Entrance Stairs-Rails: Right Home Layout: One  level Home Equipment: Shower seat;Tub bench;Bedside commode Prior Function Level of Independence: Independent Communication Communication: No difficulties Dominant Hand: Right    Cognition   Cognition Arousal/Alertness: Awake/alert Behavior During Therapy: WFL for tasks assessed/performed Overall Cognitive Status: Within Functional Limits for tasks assessed    Extremity/Trunk Assessment Upper Extremity Assessment Upper Extremity Assessment: Overall WFL for tasks assessed Lower Extremity Assessment Lower Extremity Assessment: Defer to PT evaluation   Balance Balance Balance Assessed: Yes Static Sitting Balance Static Sitting - Balance Support: No upper extremity supported;Feet supported Static Standing Balance Static Standing - Balance Support: No upper extremity supported Dynamic Standing Balance Dynamic Standing - Balance Support: No upper extremity supported Dynamic Standing - Level of Assistance: 5: Stand by assistance  End of Session PT - End of Session Equipment Utilized During Treatment: Gait belt;Back brace Activity Tolerance: Patient limited by fatigue Patient left: in chair;with call bell/phone within reach;with family/visitor present Nurse Communication: Mobility status;Other (comment) (need for RW)  GP Functional Assessment Tool Used: clinicla judgement Functional Limitation: Mobility: Walking and moving around Mobility: Walking and Moving Around Current Status [phone]): At least 60 percent but less than 80 percent impaired, limited or restricted Mobility: Walking and Moving Around Goal Status [phone]): At least 60 percent but less than 80 percent impaired, limited or restricted Mobility: Walking and Moving Around Discharge Status [phone]): At least 60 percent but less than 80 percent impaired, limited or restricted   Ferman Hamming 10/24/2013, 10:41 AM Weldon Picking PT Acute Rehab Services [phone] Beeper [phone]

## 2013-10-24 NOTE — Progress Notes (Signed)
Patient discharged to home accompanied by brother. Discharge instructions and rx given and explained and patient stated understanding. Patients IV was removed and she left unit in a stable condition via wheelchair.

## 2013-10-24 NOTE — Op Note (Signed)
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  OPERATIVE PROCEDURE:  Left L4 laminotomy with facetectomy and lateral recess decompression.  COMPLICATIONS:  None.  CONDITION:  Stable.  SURGEON:  Alvy Beal, MD  FIRST ASSISTANT:  Genene Churn. Denton Meek.  HISTORY:  This is a very pleasant 47 year old woman who has been having progressive debilitating back, buttock, and left leg radicular pain in the L5 distribution.  MRI and x-rays confirmed lateral recess stenosis, with significant facet arthrosis.  After discussing treatment options, we elected to proceed with surgery.  All appropriate risks, benefits, and alternatives were discussed with the patient and consent was obtained.  OPERATIVE NOTE:  The patient was brought to the operating room, placed supine on the operating table.  After successful induction of general anesthesia and endotracheal intubation, TEDs,SCDs were applied.  Time- out was taken to confirm the patient, procedure and all other pertinent important data.  Once this was completed, 2 needles were placed in the back to localize the skin incision.  A midline incision was made, after infiltrating it with 0.25% Marcaine with epi.  The sharp dissection was carried out down to the deep fascia.  I incised the deep fascia and exposed the spinous processes of L3, 4 and 5.  I then used the bipolar electrocautery to dissect on the left lateral side and then released the paraspinal muscles to completely expose the spinous process lamina and the lateral  aspect of the L4-5 facet complex. A Penfield 4 was placed on the L4-5 facet and an intraoperative x-ray was taken.  Once I confirmed I was at the appropriate level,  I then placed a Taylor retractor on the lateral aspect of the facet complex so that I could visualize the left posterolateral aspect of the spine.  I then used a small curved curette to release the ligamentum flavum from the leading edge of the L5 lamina and then dissect underneath the L4 lamina.  I then used a double-action Leksell rongeur to perform a generous laminotomy of L4.  I then released the ligamentum flavum from the L5 leading edge of the lamina with a 2 mm Kerrison.  I then used the [place] 4 to resect to dissect through the ligamentum flavum.  At this point, I noticed there was significant lateral facet bone osteophytes from the L4-5 facet complex.  This was causing significant direct pressure to the traversing L5 nerve root.  I then dissected and created a plane using my Penfield 4 between the thecal sac and the bone spur.  I used a 2 mm Kerrison to resect the bone spur.  I then noticed that the patient has a very congenitally short pedicle.  I think the combination of the large exostosis from the  facet and the short pedicle caused a significant L5 nerve root compression.  The L5 nerve root was identified and I removed all the bone spur lateral to it.  At this point, I was palpating and visualizing the medial border of the L5 pedicle.  I then traced the nerve root into the foramen below the L5 pedicle.  Once I had an adequate posterolateral decompression, I then went superiorly removing both the inferior aspect of the L4 facet and the superior aspect of the L5 facet.  This allowed for an adequate lateral recess decompression.  I could then visualize the L4 nerve root as it was traveling in to the 4 foramen.  At this point, using my nerve hook, I could palpate out the 4 foramen and I could directly visualize  and palpate underneath the L5 nerve root as it passed medial to the 5 pedicle and out laterally.  At this point, I had an adequate decompression.  Hemostasis was obtained using bipolar electrocautery and Floseal.  The wound was copiously irrigated with normal saline.  I then closed the deep fascia with interrupted #1 Vicryl suture, and then a running layer of 0 interrupted 2-0 Vicryl sutures, and a 3-0 Monocryl for the skin.  Steri-Strips and a dry dressing were applied and at no time was there any evidence of CSF leak.     Alvy Beal, MD     DDB/MEDQ  D:  10/23/2013  T:  10/24/2013  Job:  [phone]

## 2013-10-25 NOTE — Anesthesia Postprocedure Evaluation (Signed)
  Anesthesia Post-op Note  Patient: [name]  Procedure(s) Performed: Procedure(s): DECOMPRESSION AND FACET REMOVAL L4 - L5 1 LEVEL (N/A)  Patient Location: PACU  Anesthesia Type:General  Level of Consciousness: awake  Airway and Oxygen Therapy: Patient Spontanous Breathing  Post-op Pain: mild  Post-op Assessment: Post-op Vital signs reviewed  Post-op Vital Signs: stable  Complications: No apparent anesthesia complications

## 2013-10-31 NOTE — Discharge Summary (Signed)
  ABBREVIATED DISCHARGE SUMMARY      DATE OF HOSPITALIZATION:  23 Oct 2013   REASON FOR HOSPITALIZATION:  47 yo female with hx of L4-5 stenosis, low back pain.  Failed conservative tx.     SIGNIFICANT FINDINGS: stenosis  OPERATION:  L4-5 decompression  FINAL DIAGNOSIS:  same  SECONDARY DIAGNOSIS: none  CONSULTANTS:  none  DISCHARGE CONDITION:  STABLE  DISCHARGED TO:  HOME

## 2013-11-01 NOTE — Discharge Summary (Signed)
Agree with above 

## 2015-01-08 ENCOUNTER — Other Ambulatory Visit: Payer: Self-pay | Admitting: Orthopedic Surgery

## 2015-01-08 DIAGNOSIS — M5136 Other intervertebral disc degeneration, lumbar region: Secondary | ICD-10-CM

## 2015-01-09 ENCOUNTER — Other Ambulatory Visit: Payer: Self-pay

## 2015-01-13 MED ORDER — MIDAZOLAM HCL 2 MG/2ML IJ SOLN: 1.0000 mg | INTRAMUSCULAR | Status: DC | PRN

## 2015-01-13 MED ORDER — FENTANYL CITRATE 0.05 MG/ML IJ SOLN: 25.0000 ug | INTRAMUSCULAR | Status: DC | PRN

## 2015-01-13 MED ORDER — SODIUM CHLORIDE 0.9 % IV SOLN: Freq: Once | INTRAVENOUS | Status: DC

## 2015-01-13 MED ORDER — KETOROLAC TROMETHAMINE 30 MG/ML IJ SOLN: 30.0000 mg | Freq: Once | INTRAMUSCULAR | Status: AC

## 2015-01-13 MED ORDER — CEFAZOLIN SODIUM-DEXTROSE 2-3 GM-% IV SOLR: 2.0000 g | Freq: Once | INTRAVENOUS | Status: DC

## 2015-01-14 ENCOUNTER — Inpatient Hospital Stay
Admission: RE | Admit: 2015-01-14 | Discharge: 2015-01-14 | Disposition: A | Discharge status: Home / Self Care | Payer: Self-pay | Source: Ambulatory Visit | Attending: Orthopedic Surgery | Admitting: Orthopedic Surgery

## 2015-01-14 ENCOUNTER — Other Ambulatory Visit: Payer: Self-pay

## 2015-01-14 DIAGNOSIS — M5137 Other intervertebral disc degeneration, lumbosacral region: Secondary | ICD-10-CM

## 2015-01-19 ENCOUNTER — Inpatient Hospital Stay: Admission: RE | Admit: 2015-01-19 | Payer: Self-pay | Source: Ambulatory Visit

## 2015-01-19 ENCOUNTER — Other Ambulatory Visit: Payer: Self-pay

## 2015-01-27 ENCOUNTER — Other Ambulatory Visit: Payer: Self-pay | Admitting: Orthopedic Surgery

## 2015-01-27 ENCOUNTER — Ambulatory Visit
Admission: RE | Admit: 2015-01-27 | Discharge: 2015-01-27 | Disposition: A | Discharge status: Home / Self Care | Payer: Self-pay | Source: Ambulatory Visit | Attending: Orthopedic Surgery | Admitting: Orthopedic Surgery

## 2015-01-27 ENCOUNTER — Ambulatory Visit
Admission: RE | Admit: 2015-01-27 | Discharge: 2015-01-27 | Disposition: A | Discharge status: Home / Self Care | Payer: BLUE CROSS/BLUE SHIELD | Source: Ambulatory Visit | Attending: Orthopedic Surgery | Admitting: Orthopedic Surgery

## 2015-01-27 DIAGNOSIS — R52 Pain, unspecified: Secondary | ICD-10-CM

## 2015-01-27 DIAGNOSIS — M5136 Other intervertebral disc degeneration, lumbar region: Secondary | ICD-10-CM

## 2015-01-27 DIAGNOSIS — M545 Low back pain: Secondary | ICD-10-CM

## 2015-01-27 MED ORDER — FENTANYL CITRATE 0.05 MG/ML IJ SOLN
25.0000 ug | INTRAMUSCULAR | Status: DC | PRN
  Administered 2015-01-27: 100 ug via INTRAVENOUS

## 2015-01-27 MED ORDER — IOHEXOL 180 MG/ML  SOLN
7.5000 mL | Freq: Once | INTRAMUSCULAR | Status: AC | PRN
  Administered 2015-01-27: 7.5 mL

## 2015-01-27 MED ORDER — CEFAZOLIN SODIUM-DEXTROSE 2-3 GM-% IV SOLR
2.0000 g | Freq: Once | INTRAVENOUS | Status: AC
  Administered 2015-01-27: 2 g via INTRAVENOUS

## 2015-01-27 MED ORDER — KETOROLAC TROMETHAMINE 30 MG/ML IJ SOLN
30.0000 mg | Freq: Once | INTRAMUSCULAR | Status: AC
  Administered 2015-01-27: 30 mg via INTRAVENOUS

## 2015-01-27 MED ORDER — SODIUM CHLORIDE 0.9 % IV SOLN
Freq: Once | INTRAVENOUS | Status: AC
  Administered 2015-01-27: 09:00:00 via INTRAVENOUS

## 2015-01-27 MED ORDER — MIDAZOLAM HCL 2 MG/2ML IJ SOLN
1.0000 mg | INTRAMUSCULAR | Status: DC | PRN
  Administered 2015-01-27: 1 mg via INTRAVENOUS

## 2015-01-27 NOTE — Discharge Instructions (Addendum)
Discogram Post Procedure Discharge Instructions  1. May resume a regular diet and any medications that you routinely take (including pain medications). 2. No driving day of procedure. 3. Upon discharge go home and rest for at least 4 hours.  May use an ice pack as needed to injection sites on back.  Ice to back 30 minutes on and 30 minutes off, all day. 4. May remove bandades later, today. 5. It is not unusual to be sore for several days after this procedure.    Please contact our office at [phone] for the following symptoms:   Fever greater than 100 degrees  Increased swelling, pain, or redness at injection site.   Thank you for visiting [facility].    Lumbar Puncture Discharge Instructions  1. Go home and rest quietly for the next 24 hours.  It is important to lie flat for the next 24 hours.  Get up only to go to the restroom.  You may lie in the bed or on a couch on your back, your stomach, your left side or your right side.  You may have one pillow under your head.  You may have pillows between your knees while you are on your side or under your knees while you are on your back.  2. DO NOT drive today.  Recline the seat as far back as it will go, while still wearing your seat belt, on the way home.  3. You may get up to go to the bathroom as needed.  You may sit up for 10 minutes to eat.  You may resume your normal diet and medications unless otherwise indicated.  Drink lots of extra fluids today and tomorrow.  4. The incidence of headache, nausea, or vomiting is about 5% (one in 20 patients).  If you develop a headache, lie flat and drink plenty of fluids until the headache goes away.  Caffeinated beverages may be helpful.  If you develop severe nausea and vomiting or a headache that does not go away with flat bed rest, call the physician who sent you here.   5. You may resume normal activities after your 24 hours of bed rest is over; however, do not exert yourself  strongly or do any heavy lifting tomorrow.  6. Call your physician for a follow-up appointment.   7. If you have any questions  after you arrive home, please call [phone].  Discharge instructions have been explained to the patient.  The patient, or the person responsible for the patient, fully understands these instructions.

## 2015-03-31 ENCOUNTER — Encounter (HOSPITAL_COMMUNITY): Payer: Self-pay | Admitting: Psychiatry

## 2015-03-31 ENCOUNTER — Ambulatory Visit (INDEPENDENT_AMBULATORY_CARE_PROVIDER_SITE_OTHER): Payer: BLUE CROSS/BLUE SHIELD | Admitting: Psychiatry

## 2015-03-31 VITALS — BP 145/95 | HR 95 | Ht 65.0 in | Wt 268.0 lb

## 2015-03-31 DIAGNOSIS — F332 Major depressive disorder, recurrent severe without psychotic features: Secondary | ICD-10-CM | POA: Diagnosis not present

## 2015-03-31 DIAGNOSIS — F4010 Social phobia, unspecified: Secondary | ICD-10-CM | POA: Diagnosis not present

## 2015-03-31 DIAGNOSIS — F329 Major depressive disorder, single episode, unspecified: Secondary | ICD-10-CM | POA: Insufficient documentation

## 2015-03-31 DIAGNOSIS — F411 Generalized anxiety disorder: Secondary | ICD-10-CM | POA: Diagnosis not present

## 2015-03-31 MED ORDER — ALPRAZOLAM 1 MG PO TABS: 1.0000 mg | ORAL_TABLET | Freq: Three times a day (TID) | ORAL | Status: DC

## 2015-03-31 MED ORDER — FLUOXETINE HCL 40 MG PO CAPS: 40.0000 mg | ORAL_CAPSULE | Freq: Every day | ORAL | Status: DC

## 2015-03-31 MED ORDER — DULOXETINE HCL 60 MG PO CPEP: 60.0000 mg | ORAL_CAPSULE | Freq: Every day | ORAL | Status: DC
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
## 2015-03-31 NOTE — Patient Instructions (Signed)
Stop wellbutrin xl Continue cymbalta Start fluoxetine Start xanax 1 mg three times a day

## 2015-04-30 ENCOUNTER — Ambulatory Visit (INDEPENDENT_AMBULATORY_CARE_PROVIDER_SITE_OTHER): Payer: BLUE CROSS/BLUE SHIELD | Admitting: Psychiatry

## 2015-04-30 ENCOUNTER — Encounter (HOSPITAL_COMMUNITY): Payer: Self-pay | Admitting: Psychiatry

## 2015-04-30 VITALS — BP 108/62 | HR 80 | Ht 65.0 in | Wt 268.0 lb

## 2015-04-30 DIAGNOSIS — F332 Major depressive disorder, recurrent severe without psychotic features: Secondary | ICD-10-CM | POA: Diagnosis not present

## 2015-04-30 DIAGNOSIS — F411 Generalized anxiety disorder: Secondary | ICD-10-CM | POA: Diagnosis not present

## 2015-04-30 DIAGNOSIS — F418 Other specified anxiety disorders: Secondary | ICD-10-CM

## 2015-04-30 MED ORDER — TRAZODONE HCL 100 MG PO TABS: 100.0000 mg | ORAL_TABLET | Freq: Every day | ORAL | Status: DC

## 2015-04-30 MED ORDER — FLUOXETINE HCL 40 MG PO CAPS: 40.0000 mg | ORAL_CAPSULE | Freq: Every day | ORAL | Status: DC

## 2015-04-30 MED ORDER — DULOXETINE HCL 60 MG PO CPEP: 60.0000 mg | ORAL_CAPSULE | Freq: Every day | ORAL | Status: DC
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
## 2015-05-04 ENCOUNTER — Telehealth (HOSPITAL_COMMUNITY): Payer: Self-pay | Admitting: *Deleted

## 2015-05-04 NOTE — Telephone Encounter (Signed)
Called pt home and left message to call back. Phone kept ringing and no answer. Called pt mobile and lmtcb and number provided

## 2015-05-20 ENCOUNTER — Ambulatory Visit (INDEPENDENT_AMBULATORY_CARE_PROVIDER_SITE_OTHER): Payer: BLUE CROSS/BLUE SHIELD | Admitting: Psychiatry

## 2015-05-20 ENCOUNTER — Encounter (HOSPITAL_COMMUNITY): Payer: Self-pay | Admitting: Psychiatry

## 2015-05-20 VITALS — BP 123/84 | HR 88 | Ht 65.0 in | Wt 268.6 lb

## 2015-05-20 DIAGNOSIS — F418 Other specified anxiety disorders: Secondary | ICD-10-CM | POA: Diagnosis not present

## 2015-05-20 DIAGNOSIS — F332 Major depressive disorder, recurrent severe without psychotic features: Secondary | ICD-10-CM

## 2015-05-20 DIAGNOSIS — F411 Generalized anxiety disorder: Secondary | ICD-10-CM | POA: Diagnosis not present

## 2015-05-20 MED ORDER — TRAZODONE HCL 100 MG PO TABS: 200.0000 mg | ORAL_TABLET | Freq: Every day | ORAL | Status: DC

## 2015-05-20 MED ORDER — ALPRAZOLAM 1 MG PO TABS: 1.0000 mg | ORAL_TABLET | Freq: Four times a day (QID) | ORAL | Status: DC

## 2015-05-20 MED ORDER — FLUOXETINE HCL 40 MG PO CAPS: 40.0000 mg | ORAL_CAPSULE | Freq: Every day | ORAL | Status: DC

## 2015-05-20 MED ORDER — DULOXETINE HCL 60 MG PO CPEP: 60.0000 mg | ORAL_CAPSULE | Freq: Every day | ORAL | Status: DC
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
## 2015-05-25 ENCOUNTER — Ambulatory Visit (HOSPITAL_COMMUNITY): Payer: Self-pay | Admitting: Psychiatry

## 2015-06-26 ENCOUNTER — Ambulatory Visit (INDEPENDENT_AMBULATORY_CARE_PROVIDER_SITE_OTHER): Payer: BLUE CROSS/BLUE SHIELD | Admitting: Psychiatry

## 2015-06-26 ENCOUNTER — Encounter (HOSPITAL_COMMUNITY): Payer: Self-pay | Admitting: Psychiatry

## 2015-06-26 VITALS — BP 133/90 | HR 84 | Ht 65.0 in | Wt 271.0 lb

## 2015-06-26 DIAGNOSIS — F411 Generalized anxiety disorder: Secondary | ICD-10-CM | POA: Diagnosis not present

## 2015-06-26 DIAGNOSIS — F4010 Social phobia, unspecified: Secondary | ICD-10-CM | POA: Diagnosis not present

## 2015-06-26 DIAGNOSIS — F332 Major depressive disorder, recurrent severe without psychotic features: Secondary | ICD-10-CM

## 2015-06-26 MED ORDER — AMITRIPTYLINE HCL 50 MG PO TABS: ORAL_TABLET | ORAL | Status: DC

## 2015-06-26 NOTE — Patient Instructions (Signed)
Stop trazodone Continue Amitryptiline Continue all other medications
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
## 2015-07-23 ENCOUNTER — Encounter (HOSPITAL_COMMUNITY): Payer: Self-pay | Admitting: Psychiatry

## 2015-07-23 ENCOUNTER — Ambulatory Visit (INDEPENDENT_AMBULATORY_CARE_PROVIDER_SITE_OTHER): Payer: BLUE CROSS/BLUE SHIELD | Admitting: Psychiatry

## 2015-07-23 VITALS — BP 140/76 | HR 76 | Ht 65.0 in | Wt 269.2 lb

## 2015-07-23 DIAGNOSIS — F418 Other specified anxiety disorders: Secondary | ICD-10-CM | POA: Diagnosis not present

## 2015-07-23 DIAGNOSIS — F411 Generalized anxiety disorder: Secondary | ICD-10-CM | POA: Diagnosis not present

## 2015-07-23 DIAGNOSIS — F332 Major depressive disorder, recurrent severe without psychotic features: Secondary | ICD-10-CM

## 2015-07-23 MED ORDER — ARIPIPRAZOLE 5 MG PO TABS: 5.0000 mg | ORAL_TABLET | Freq: Every day | ORAL | Status: DC

## 2015-07-23 MED ORDER — FLUOXETINE HCL 40 MG PO CAPS: 40.0000 mg | ORAL_CAPSULE | Freq: Every day | ORAL | Status: DC

## 2015-07-23 MED ORDER — DULOXETINE HCL 60 MG PO CPEP: 60.0000 mg | ORAL_CAPSULE | Freq: Every day | ORAL | Status: DC

## 2015-07-23 MED ORDER — ALPRAZOLAM 1 MG PO TABS: 1.0000 mg | ORAL_TABLET | Freq: Four times a day (QID) | ORAL | Status: DC
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
## 2015-08-20 ENCOUNTER — Ambulatory Visit (INDEPENDENT_AMBULATORY_CARE_PROVIDER_SITE_OTHER): Payer: BLUE CROSS/BLUE SHIELD | Admitting: Psychiatry

## 2015-08-20 ENCOUNTER — Encounter (HOSPITAL_COMMUNITY): Payer: Self-pay | Admitting: Psychiatry

## 2015-08-20 VITALS — BP 137/92 | HR 75 | Ht 65.0 in | Wt 268.0 lb

## 2015-08-20 DIAGNOSIS — F332 Major depressive disorder, recurrent severe without psychotic features: Secondary | ICD-10-CM | POA: Diagnosis not present

## 2015-08-20 DIAGNOSIS — F4010 Social phobia, unspecified: Secondary | ICD-10-CM

## 2015-08-20 DIAGNOSIS — F411 Generalized anxiety disorder: Secondary | ICD-10-CM | POA: Diagnosis not present

## 2015-08-20 MED ORDER — FLUOXETINE HCL 40 MG PO CAPS: 40.0000 mg | ORAL_CAPSULE | Freq: Every day | ORAL | Status: DC

## 2015-08-20 MED ORDER — ARIPIPRAZOLE 5 MG PO TABS: 5.0000 mg | ORAL_TABLET | Freq: Every day | ORAL | Status: DC

## 2015-08-20 MED ORDER — DULOXETINE HCL 60 MG PO CPEP: 60.0000 mg | ORAL_CAPSULE | Freq: Every day | ORAL | Status: DC
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
## 2015-09-16 ENCOUNTER — Other Ambulatory Visit (HOSPITAL_COMMUNITY): Payer: Self-pay | Admitting: Psychiatry

## 2015-09-17 ENCOUNTER — Encounter (HOSPITAL_COMMUNITY): Payer: Self-pay | Admitting: Psychiatry

## 2015-09-17 ENCOUNTER — Ambulatory Visit (INDEPENDENT_AMBULATORY_CARE_PROVIDER_SITE_OTHER): Payer: BLUE CROSS/BLUE SHIELD | Admitting: Psychiatry

## 2015-09-17 VITALS — BP 122/81 | HR 80 | Ht 65.0 in | Wt 260.0 lb

## 2015-09-17 DIAGNOSIS — F332 Major depressive disorder, recurrent severe without psychotic features: Secondary | ICD-10-CM

## 2015-09-17 DIAGNOSIS — F411 Generalized anxiety disorder: Secondary | ICD-10-CM

## 2015-09-17 DIAGNOSIS — F4010 Social phobia, unspecified: Secondary | ICD-10-CM | POA: Diagnosis not present

## 2015-09-17 MED ORDER — ALPRAZOLAM 1 MG PO TABS: 1.0000 mg | ORAL_TABLET | Freq: Four times a day (QID) | ORAL | Status: DC

## 2015-09-17 MED ORDER — DULOXETINE HCL 60 MG PO CPEP: 60.0000 mg | ORAL_CAPSULE | Freq: Every day | ORAL | Status: DC

## 2015-09-17 MED ORDER — ARIPIPRAZOLE 5 MG PO TABS: 5.0000 mg | ORAL_TABLET | Freq: Every day | ORAL | Status: DC

## 2015-09-17 MED ORDER — ZOLPIDEM TARTRATE 10 MG PO TABS: 10.0000 mg | ORAL_TABLET | Freq: Every day | ORAL | Status: DC

## 2015-09-17 MED ORDER — FLUOXETINE HCL 40 MG PO CAPS: 40.0000 mg | ORAL_CAPSULE | Freq: Every day | ORAL | Status: DC
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
## 2015-10-01 ENCOUNTER — Telehealth (HOSPITAL_COMMUNITY): Payer: Self-pay | Admitting: *Deleted

## 2015-10-26 ENCOUNTER — Telehealth (HOSPITAL_COMMUNITY): Payer: Self-pay | Admitting: *Deleted

## 2015-10-26 ENCOUNTER — Ambulatory Visit (HOSPITAL_COMMUNITY): Payer: Self-pay | Admitting: Psychiatry

## 2015-10-27 ENCOUNTER — Ambulatory Visit (HOSPITAL_COMMUNITY): Payer: Self-pay | Admitting: Psychiatry

## 2015-11-30 ENCOUNTER — Telehealth (HOSPITAL_COMMUNITY): Payer: Self-pay | Admitting: *Deleted

## 2015-12-11 ENCOUNTER — Telehealth (HOSPITAL_COMMUNITY): Payer: Self-pay | Admitting: *Deleted

## 2015-12-11 NOTE — Telephone Encounter (Signed)
As instructed to do, I called pt insurance and spoke with Charmin in reference to pt previous call. Per Charmin, Dr. Ross is noTenny Craw in network with pt plan but pt does have out of network benefit. Per Charmin, pt will have to meet a deductible of $6,900  and once she does, she will be covered 50% until she meets her out of pocket benefit of $2,450.  Per Charmin, the only way provider can be in network is by having her credential people try to get her in network with [employer]. When concluding the conversation, asked Charmin again if Dr. Tenny Craw was not in network with [employer] and she agreed. Called pt and informed her of this and she asked how much will each visits be if she does come. Informed her of amount and she stated she will have to call office back to think about it.

## 2016-08-23 ENCOUNTER — Encounter (HOSPITAL_COMMUNITY): Payer: Self-pay | Admitting: Psychiatry

## 2016-08-23 ENCOUNTER — Ambulatory Visit (INDEPENDENT_AMBULATORY_CARE_PROVIDER_SITE_OTHER): Payer: Medicare HMO | Admitting: Psychiatry

## 2016-08-23 VITALS — BP 120/87 | HR 96 | Ht 65.0 in | Wt 251.2 lb

## 2016-08-23 DIAGNOSIS — Z813 Family history of other psychoactive substance abuse and dependence: Secondary | ICD-10-CM | POA: Diagnosis not present

## 2016-08-23 DIAGNOSIS — F332 Major depressive disorder, recurrent severe without psychotic features: Secondary | ICD-10-CM

## 2016-08-23 DIAGNOSIS — F4010 Social phobia, unspecified: Secondary | ICD-10-CM | POA: Diagnosis not present

## 2016-08-23 DIAGNOSIS — F411 Generalized anxiety disorder: Secondary | ICD-10-CM | POA: Diagnosis not present

## 2016-08-23 DIAGNOSIS — Z811 Family history of alcohol abuse and dependence: Secondary | ICD-10-CM

## 2016-08-23 MED ORDER — DULOXETINE HCL 60 MG PO CPEP: 60.0000 mg | ORAL_CAPSULE | Freq: Every day | ORAL | 2 refills | Status: DC

## 2016-08-23 MED ORDER — ZOLPIDEM TARTRATE 10 MG PO TABS: 10.0000 mg | ORAL_TABLET | Freq: Every day | ORAL | 2 refills | Status: DC

## 2016-08-23 MED ORDER — ALPRAZOLAM 1 MG PO TABS
1.0000 mg | ORAL_TABLET | Freq: Three times a day (TID) | ORAL | 2 refills | Status: DC
Start: 2016-08-23 — End: 2016-11-21

## 2016-08-23 MED ORDER — ARIPIPRAZOLE 5 MG PO TABS: 5.0000 mg | ORAL_TABLET | Freq: Every day | ORAL | 2 refills | Status: DC
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
## 2016-09-13 ENCOUNTER — Other Ambulatory Visit (HOSPITAL_COMMUNITY): Payer: Self-pay | Admitting: Surgery

## 2016-09-13 DIAGNOSIS — K9189 Other postprocedural complications and disorders of digestive system: Secondary | ICD-10-CM

## 2016-09-19 ENCOUNTER — Other Ambulatory Visit (HOSPITAL_COMMUNITY): Payer: Self-pay | Admitting: Surgery

## 2016-09-19 DIAGNOSIS — K9189 Other postprocedural complications and disorders of digestive system: Secondary | ICD-10-CM

## 2016-09-20 ENCOUNTER — Ambulatory Visit (HOSPITAL_COMMUNITY): Payer: Self-pay | Admitting: Psychiatry

## 2016-09-21 ENCOUNTER — Encounter (HOSPITAL_COMMUNITY): Payer: Self-pay | Admitting: Psychiatry

## 2016-09-21 ENCOUNTER — Ambulatory Visit (INDEPENDENT_AMBULATORY_CARE_PROVIDER_SITE_OTHER): Payer: Medicare HMO | Admitting: Psychiatry

## 2016-09-21 VITALS — BP 129/72 | HR 72 | Ht 65.0 in | Wt 265.2 lb

## 2016-09-21 DIAGNOSIS — Z79899 Other long term (current) drug therapy: Secondary | ICD-10-CM

## 2016-09-21 DIAGNOSIS — F332 Major depressive disorder, recurrent severe without psychotic features: Secondary | ICD-10-CM | POA: Diagnosis not present

## 2016-09-21 MED ORDER — ZOLPIDEM TARTRATE ER 12.5 MG PO TBCR: 12.5000 mg | EXTENDED_RELEASE_TABLET | Freq: Every evening | ORAL | 2 refills | Status: DC | PRN
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
## 2016-11-02 NOTE — Progress Notes (Signed)
Pt is being scheduled for preop appt; please place surgical orders in epic. Thanks.  

## 2016-11-21 ENCOUNTER — Encounter (HOSPITAL_COMMUNITY): Payer: Self-pay | Admitting: Psychiatry

## 2016-11-21 ENCOUNTER — Ambulatory Visit (INDEPENDENT_AMBULATORY_CARE_PROVIDER_SITE_OTHER): Payer: Medicare HMO | Admitting: Psychiatry

## 2016-11-21 VITALS — BP 137/83 | HR 80 | Ht 65.0 in | Wt 248.2 lb

## 2016-11-21 DIAGNOSIS — F332 Major depressive disorder, recurrent severe without psychotic features: Secondary | ICD-10-CM | POA: Diagnosis not present

## 2016-11-21 DIAGNOSIS — F411 Generalized anxiety disorder: Secondary | ICD-10-CM | POA: Diagnosis not present

## 2016-11-21 DIAGNOSIS — Z811 Family history of alcohol abuse and dependence: Secondary | ICD-10-CM

## 2016-11-21 DIAGNOSIS — Z79899 Other long term (current) drug therapy: Secondary | ICD-10-CM | POA: Diagnosis not present

## 2016-11-21 DIAGNOSIS — Z888 Allergy status to other drugs, medicaments and biological substances status: Secondary | ICD-10-CM

## 2016-11-21 DIAGNOSIS — Z813 Family history of other psychoactive substance abuse and dependence: Secondary | ICD-10-CM

## 2016-11-21 DIAGNOSIS — Z79891 Long term (current) use of opiate analgesic: Secondary | ICD-10-CM

## 2016-11-21 MED ORDER — ALPRAZOLAM 1 MG PO TABS: 1.0000 mg | ORAL_TABLET | Freq: Three times a day (TID) | ORAL | 2 refills | Status: DC

## 2016-11-21 MED ORDER — ZOLPIDEM TARTRATE 10 MG PO TABS: 10.0000 mg | ORAL_TABLET | Freq: Every day | ORAL | 2 refills | Status: DC

## 2016-11-21 MED ORDER — DULOXETINE HCL 60 MG PO CPEP: 60.0000 mg | ORAL_CAPSULE | Freq: Every day | ORAL | 2 refills | Status: DC
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
## 2016-11-22 NOTE — Progress Notes (Signed)
PLEASE PLACE SURGICAL ORDERS IN EPIC--HAS PRE OP 11/25/16  thanks

## 2016-11-24 ENCOUNTER — Ambulatory Visit: Payer: Self-pay | Admitting: Surgery

## 2016-11-25 ENCOUNTER — Encounter (HOSPITAL_COMMUNITY): Payer: Self-pay

## 2016-11-25 ENCOUNTER — Encounter (HOSPITAL_COMMUNITY)
Admission: RE | Admit: 2016-11-25 | Discharge: 2016-11-25 | Disposition: A | Discharge status: Home / Self Care | Payer: Medicare HMO | Source: Ambulatory Visit | Attending: Surgery | Admitting: Surgery

## 2016-11-25 ENCOUNTER — Encounter (HOSPITAL_COMMUNITY): Payer: Self-pay | Admitting: *Deleted

## 2016-11-25 DIAGNOSIS — K449 Diaphragmatic hernia without obstruction or gangrene: Secondary | ICD-10-CM | POA: Insufficient documentation

## 2016-11-25 DIAGNOSIS — Z01810 Encounter for preprocedural cardiovascular examination: Secondary | ICD-10-CM | POA: Insufficient documentation

## 2016-11-25 DIAGNOSIS — R9431 Abnormal electrocardiogram [ECG] [EKG]: Secondary | ICD-10-CM | POA: Insufficient documentation

## 2016-11-25 DIAGNOSIS — Z01818 Encounter for other preprocedural examination: Secondary | ICD-10-CM | POA: Diagnosis present

## 2016-11-25 DIAGNOSIS — Z7984 Long term (current) use of oral hypoglycemic drugs: Secondary | ICD-10-CM | POA: Diagnosis not present

## 2016-11-25 DIAGNOSIS — Z79899 Other long term (current) drug therapy: Secondary | ICD-10-CM | POA: Insufficient documentation

## 2016-11-25 HISTORY — DX: Anemia, unspecified: D64.9

## 2016-11-25 HISTORY — DX: Unspecified osteoarthritis, unspecified site: M19.90

## 2016-11-25 HISTORY — DX: Type 2 diabetes mellitus without complications: E11.9

## 2016-11-25 HISTORY — DX: Personal history of urinary calculi: Z87.442

## 2016-11-25 LAB — BASIC METABOLIC PANEL
Anion gap: 12 (ref 5–15)
BUN: 14 mg/dL (ref 6–20)
CHLORIDE: 100 mmol/L — AB (ref 101–111)
CO2: 27 mmol/L (ref 22–32)
CREATININE: 0.89 mg/dL (ref 0.44–1.00)
Calcium: 9.3 mg/dL (ref 8.9–10.3)
GFR calc Af Amer: >60 mL/min (ref >60)
GFR calc non Af Amer: >60 mL/min (ref >60)
GLUCOSE: 112 mg/dL — AB (ref 65–99)
POTASSIUM: 3.7 mmol/L (ref 3.5–5.1)
Sodium: 139 mmol/L (ref 135–145)

## 2016-11-25 LAB — CBC
HEMATOCRIT: 41.9 % (ref 36.0–46.0)
Hemoglobin: 13.6 g/dL (ref 12.0–15.0)
MCH: 25.3 pg — AB (ref 26.0–34.0)
MCHC: 32.5 g/dL (ref 30.0–36.0)
MCV: 77.9 fL — AB (ref 78.0–100.0)
PLATELETS: 459 10*3/uL — AB (ref 150–400)
RBC: 5.38 MIL/uL — ABNORMAL HIGH (ref 3.87–5.11)
RDW: 14.8 % (ref 11.5–15.5)
WBC: 13.6 10*3/uL — ABNORMAL HIGH (ref 4.0–10.5)

## 2016-11-25 LAB — SURGICAL PCR SCREEN
MRSA, PCR: POSITIVE — AB
STAPHYLOCOCCUS AUREUS: POSITIVE — AB

## 2016-11-25 LAB — GLUCOSE, CAPILLARY: Glucose-Capillary: 114 mg/dL — ABNORMAL HIGH (ref 65–99)

## 2016-11-25 LAB — ABO/RH: ABO/RH(D): O POS

## 2016-11-25 NOTE — Patient Instructions (Addendum)
[name]  11/25/2016   Your procedure is scheduled on: 11/30/2016    Report to [facility]  Entrance take [place]  elevators to 3rd floor to  Short Stay Center at   0630 AM.  Call this number if you have problems the morning of surgery [phone]   Remember: ONLY 1 PERSON MAY GO WITH YOU TO SHORT STAY TO GET  READY MORNING OF YOUR SURGERY.  Do not eat food or drink liquids :After Midnight.     Take these medicines the morning of surgery with A SIP OF WATER:   Albuterol Inhaler if needed and bring, Xanax, Cymbalta, Metoprolol ( Toprol), Protonix  DO NOT TAKE ANY DIABETIC MEDICATIONS DAY OF YOUR SURGERY                               You may not have any metal on your body including hair pins and              piercings  Do not wear jewelry, make-up, lotions, powders or perfumes, deodorant             Do not wear nail polish.  Do not shave  48 hours prior to surgery.              Do not bring valuables to the hospital. [facility] IS NOT             RESPONSIBLE   FOR VALUABLES.  Contacts, dentures or bridgework may not be worn into surgery.  Leave suitcase in the car. After surgery it may be brought to your room.         Special Instructions: N/A              Please read over the following fact sheets you were given: _____________________________________________________________________             [facility] - Preparing for Surgery Before surgery, you can play an important role.  Because skin is not sterile, your skin needs to be as free of germs as possible.  You can reduce the number of germs on your skin by washing with CHG (chlorahexidine gluconate) soap before surgery.  CHG is an antiseptic cleaner which kills germs and bonds with the skin to continue killing germs even after washing. Please DO NOT use if you have an allergy to CHG or antibacterial soaps.  If your skin becomes reddened/irritated stop using the CHG and inform your nurse when you  arrive at Short Stay. Do not shave (including legs and underarms) for at least 48 hours prior to the first CHG shower.  You may shave your face/neck. Please follow these instructions carefully:  1.  Shower with CHG Soap the night before surgery and the  morning of Surgery.  2.  If you choose to wash your hair, wash your hair first as usual with your  normal  shampoo.  3.  After you shampoo, rinse your hair and body thoroughly to remove the  shampoo.                           4.  Use CHG as you would any other liquid soap.  You can apply chg directly  to the skin and wash  Gently with a scrungie or clean washcloth.  5.  Apply the CHG Soap to your body ONLY FROM THE NECK DOWN.   Do not use on face/ open                           Wound or open sores. Avoid contact with eyes, ears mouth and genitals (private parts).                       Wash face,  Genitals (private parts) with your normal soap.             6.  Wash thoroughly, paying special attention to the area where your surgery  will be performed.  7.  Thoroughly rinse your body with warm water from the neck down.  8.  DO NOT shower/wash with your normal soap after using and rinsing off  the CHG Soap.                9.  Pat yourself dry with a clean towel.            10.  Wear clean pajamas.            11.  Place clean sheets on your bed the night of your first shower and do not  sleep with pets. Day of Surgery : Do not apply any lotions/deodorants the morning of surgery.  Please wear clean clothes to the hospital/surgery center.  FAILURE TO FOLLOW THESE INSTRUCTIONS MAY RESULT IN THE CANCELLATION OF YOUR SURGERY PATIENT SIGNATURE_________________________________  NURSE SIGNATURE__________________________________  ________________________________________________________________________  WHAT IS A BLOOD TRANSFUSION? Blood Transfusion Information  A transfusion is the replacement of blood or some of its parts. Blood  is made up of multiple cells which provide different functions.  Red blood cells carry oxygen and are used for blood loss replacement.  White blood cells fight against infection.  Platelets control bleeding.  Plasma helps clot blood.  Other blood products are available for specialized needs, such as hemophilia or other clotting disorders. BEFORE THE TRANSFUSION  Who gives blood for transfusions?   Healthy volunteers who are fully evaluated to make sure their blood is safe. This is blood bank blood. Transfusion therapy is the safest it has ever been in the practice of medicine. Before blood is taken from a donor, a complete history is taken to make sure that person has no history of diseases nor engages in risky social behavior (examples are intravenous drug use or sexual activity with multiple partners). The donor's travel history is screened to minimize risk of transmitting infections, such as malaria. The donated blood is tested for signs of infectious diseases, such as HIV and hepatitis. The blood is then tested to be sure it is compatible with you in order to minimize the chance of a transfusion reaction. If you or a relative donates blood, this is often done in anticipation of surgery and is not appropriate for emergency situations. It takes many days to process the donated blood. RISKS AND COMPLICATIONS Although transfusion therapy is very safe and saves many lives, the main dangers of transfusion include:   Getting an infectious disease.  Developing a transfusion reaction. This is an allergic reaction to something in the blood you were given. Every precaution is taken to prevent this. The decision to have a blood transfusion has been considered carefully by your caregiver before blood is given. Blood is not given unless the benefits outweigh  the risks. AFTER THE TRANSFUSION  Right after receiving a blood transfusion, you will usually feel much better and more energetic. This is  especially true if your red blood cells have gotten low (anemic). The transfusion raises the level of the red blood cells which carry oxygen, and this usually causes an energy increase.  The nurse administering the transfusion will monitor you carefully for complications. HOME CARE INSTRUCTIONS  No special instructions are needed after a transfusion. You may find your energy is better. Speak with your caregiver about any limitations on activity for underlying diseases you may have. SEEK MEDICAL CARE IF:   Your condition is not improving after your transfusion.  You develop redness or irritation at the intravenous (IV) site. SEEK IMMEDIATE MEDICAL CARE IF:  Any of the following symptoms occur over the next 12 hours:  Shaking chills.  You have a temperature by mouth above 102 F (38.9 C), not controlled by medicine.  Chest, back, or muscle pain.  People around you feel you are not acting correctly or are confused.  Shortness of breath or difficulty breathing.  Dizziness and fainting.  You get a rash or develop hives.  You have a decrease in urine output.  Your urine turns a dark color or changes to pink, red, or brown. Any of the following symptoms occur over the next 10 days:  You have a temperature by mouth above 102 F (38.9 C), not controlled by medicine.  Shortness of breath.  Weakness after normal activity.  The white part of the eye turns yellow (jaundice).  You have a decrease in the amount of urine or are urinating less often.  Your urine turns a dark color or changes to pink, red, or brown. Document Released: 10/28/2000 Document Revised: 01/23/2012 Document Reviewed: 06/16/2008 ExitCare Patient Information 2014 [employer], [place].  _______________________________________________________________________How to Manage Your Diabetes Before and After Surgery  Why is it important to control my blood sugar before and after surgery? . Improving blood sugar levels  before and after surgery helps healing and can limit problems. . A way of improving blood sugar control is eating a healthy diet by: o  Eating less sugar and carbohydrates o  Increasing activity/exercise o  Talking with your doctor about reaching your blood sugar goals . High blood sugars (greater than 180 mg/dL) can raise your risk of infections and slow your recovery, so you will need to focus on controlling your diabetes during the weeks before surgery. . Make sure that the doctor who takes care of your diabetes knows about your planned surgery including the date and location.  How do I manage my blood sugar before surgery? . Check your blood sugar at least 4 times a day, starting 2 days before surgery, to make sure that the level is not too high or low. o Check your blood sugar the morning of your surgery when you wake up and every 2 hours until you get to the Short Stay unit. . If your blood sugar is less than 70 mg/dL, you will need to treat for low blood sugar: o Do not take insulin. o Treat a low blood sugar (less than 70 mg/dL) with  cup of clear juice (cranberry or apple), 4 glucose tablets, OR glucose gel. o Recheck blood sugar in 15 minutes after treatment (to make sure it is greater than 70 mg/dL). If your blood sugar is not greater than 70 mg/dL on recheck, call [phone] for further instructions. . Report your blood sugar to the short  stay nurse when you get to Short Stay.  . If you are admitted to the hospital after surgery: o Your blood sugar will be checked by the staff and you will probably be given insulin after surgery (instead of oral diabetes medicines) to make sure you have good blood sugar levels. o The goal for blood sugar control after surgery is 80-180 mg/dL.   WHAT DO I DO ABOUT MY DIABETES MEDICATION?  Marland Kitchen Do not take oral diabetes medicines (pills) the morning of surgery. . No Glucotrol at supper time day before surgery.    .   .  .        Patient  Signature:  Date:   Nurse Signature:  Date:   Reviewed and Endorsed by [facility] Patient Education Committee, August 2015

## 2016-11-25 NOTE — Progress Notes (Signed)
Final EKG done 11/25/16 in EPIC.

## 2016-11-25 NOTE — Progress Notes (Signed)
CBC done 11/25/16 routed via EPIC to DR Daphine DeutscherMartin.

## 2016-11-26 LAB — HEMOGLOBIN A1C
Hgb A1c MFr Bld: 6.4 % — ABNORMAL HIGH (ref 4.8–5.6)
MEAN PLASMA GLUCOSE: 137 mg/dL

## 2016-11-28 NOTE — Progress Notes (Signed)
10/25/16- LOV with DRSolum on chart

## 2016-11-30 ENCOUNTER — Ambulatory Visit (HOSPITAL_COMMUNITY): Admission: RE | Admit: 2016-11-30 | Payer: Medicare HMO | Source: Ambulatory Visit | Admitting: Surgery

## 2016-11-30 ENCOUNTER — Encounter (HOSPITAL_COMMUNITY): Admission: RE | Payer: Self-pay | Source: Ambulatory Visit

## 2016-11-30 LAB — TYPE AND SCREEN
ABO/RH(D): O POS
Antibody Screen: NEGATIVE

## 2016-11-30 SURGERY — FUNDOPLICATION, NISSEN, ROBOT-ASSISTED, LAPAROSCOPIC
Anesthesia: General

## 2016-12-08 ENCOUNTER — Ambulatory Visit (HOSPITAL_COMMUNITY)
Admission: RE | Admit: 2016-12-08 | Discharge: 2016-12-08 | Disposition: A | Discharge status: Home / Self Care | Payer: Medicare HMO | Source: Ambulatory Visit | Attending: Surgery | Admitting: Surgery

## 2016-12-08 DIAGNOSIS — K9189 Other postprocedural complications and disorders of digestive system: Secondary | ICD-10-CM | POA: Insufficient documentation

## 2016-12-22 ENCOUNTER — Encounter (HOSPITAL_COMMUNITY): Payer: Self-pay

## 2016-12-26 ENCOUNTER — Ambulatory Visit (HOSPITAL_COMMUNITY): Payer: Medicare HMO | Admitting: Certified Registered Nurse Anesthetist

## 2016-12-26 ENCOUNTER — Ambulatory Visit (HOSPITAL_COMMUNITY)
Admission: RE | Admit: 2016-12-26 | Discharge: 2016-12-26 | Disposition: A | Discharge status: Home / Self Care | Payer: Medicare HMO | Source: Ambulatory Visit | Attending: Surgery | Admitting: Surgery

## 2016-12-26 ENCOUNTER — Encounter (HOSPITAL_COMMUNITY): Payer: Self-pay

## 2016-12-26 ENCOUNTER — Encounter (HOSPITAL_COMMUNITY)
Admission: RE | Disposition: A | Discharge status: Home / Self Care | Payer: Self-pay | Source: Ambulatory Visit | Attending: Surgery

## 2016-12-26 DIAGNOSIS — G4730 Sleep apnea, unspecified: Secondary | ICD-10-CM | POA: Insufficient documentation

## 2016-12-26 DIAGNOSIS — K210 Gastro-esophageal reflux disease with esophagitis: Secondary | ICD-10-CM | POA: Insufficient documentation

## 2016-12-26 DIAGNOSIS — E1122 Type 2 diabetes mellitus with diabetic chronic kidney disease: Secondary | ICD-10-CM | POA: Insufficient documentation

## 2016-12-26 DIAGNOSIS — Z6841 Body Mass Index (BMI) 40.0 and over, adult: Secondary | ICD-10-CM | POA: Insufficient documentation

## 2016-12-26 DIAGNOSIS — F329 Major depressive disorder, single episode, unspecified: Secondary | ICD-10-CM | POA: Insufficient documentation

## 2016-12-26 DIAGNOSIS — N189 Chronic kidney disease, unspecified: Secondary | ICD-10-CM | POA: Diagnosis not present

## 2016-12-26 DIAGNOSIS — I129 Hypertensive chronic kidney disease with stage 1 through stage 4 chronic kidney disease, or unspecified chronic kidney disease: Secondary | ICD-10-CM | POA: Diagnosis not present

## 2016-12-26 DIAGNOSIS — F419 Anxiety disorder, unspecified: Secondary | ICD-10-CM | POA: Insufficient documentation

## 2016-12-26 HISTORY — PX: ESOPHAGOGASTRODUODENOSCOPY (EGD) WITH PROPOFOL: SHX5813

## 2016-12-26 LAB — GLUCOSE, CAPILLARY: Glucose-Capillary: 150 mg/dL — ABNORMAL HIGH (ref 65–99)

## 2016-12-26 SURGERY — ESOPHAGOGASTRODUODENOSCOPY (EGD) WITH PROPOFOL
Anesthesia: Monitor Anesthesia Care

## 2016-12-26 MED ORDER — PROPOFOL 10 MG/ML IV BOLUS
INTRAVENOUS | Status: AC
  Filled 2016-12-26: qty 20

## 2016-12-26 MED ORDER — PROPOFOL 10 MG/ML IV BOLUS
INTRAVENOUS | Status: DC | PRN
  Administered 2016-12-26: 30 mg via INTRAVENOUS
  Administered 2016-12-26 (×2): 20 mg via INTRAVENOUS
  Administered 2016-12-26 (×2): 10 mg via INTRAVENOUS

## 2016-12-26 MED ORDER — LACTATED RINGERS IV SOLN
INTRAVENOUS | Status: DC
Start: 2016-12-26 — End: 2016-12-26

## 2016-12-26 MED ORDER — LIDOCAINE 2% (20 MG/ML) 5 ML SYRINGE
INTRAMUSCULAR | Status: DC | PRN
  Administered 2016-12-26: 100 mg via INTRAVENOUS

## 2016-12-26 MED ORDER — LACTATED RINGERS IV SOLN
INTRAVENOUS | Status: DC | PRN
  Administered 2016-12-26: 11:00:00 via INTRAVENOUS

## 2016-12-26 MED ORDER — PROPOFOL 500 MG/50ML IV EMUL
INTRAVENOUS | Status: DC | PRN
  Administered 2016-12-26: 150 ug/kg/min via INTRAVENOUS

## 2016-12-26 NOTE — H&P (Signed)
Chief Complaint:  Heartburn and food reflux  History of Present Illness:  [name] is an 51 y.o. female who [place] in [place] in Oct [place].  She has had persistent GER and relux and she was referred for repeat Nissen  Past Medical History:  Diagnosis Date  . Anemia   . Anxiety   . Arthritis   . Chronic kidney disease    IGA  . Complication of anesthesia   . Depression   . Diabetes mellitus without complication (HCC)    type II   . GERD (gastroesophageal reflux disease)   . Heart murmur    since birth- no need to be concern  . History of kidney stones    hx of   . Hypertension   . PONV (postoperative nausea and vomiting)   . Shortness of breath    With exertion  . Sleep apnea    severe sleep apnea uses oxygen 2l at nite     Past Surgical History:  Procedure Laterality Date  . ABDOMINAL HYSTERECTOMY    . BREAST SURGERY Right    biospy x2  . devaited spetum surgery     . DILATION AND CURETTAGE OF UTERUS    . LUMBAR LAMINECTOMY/DECOMPRESSION MICRODISCECTOMY  10/23/2013   L4 L5   DR BROOKS  . LUMBAR LAMINECTOMY/DECOMPRESSION MICRODISCECTOMY N/A 10/23/2013   Procedure: DECOMPRESSION AND FACET REMOVAL L4 - L5 1 LEVEL;  Surgeon: Venita Lickahari Brooks, MD;  Location: [facility] OR;  Service: Orthopedics;  Laterality: N/A;  . nasal repair collapse      both sides of nose and 2 plates added   . NASAL SINUS SURGERY    . NISSEN FUNDOPLICATION    . right foot surgery     . SEPTOPLASTY  2013  . SHOULDER ARTHROSCOPY WITH ROTATOR CUFF REPAIR Right    x 2  . WRIST SURGERY Left    work injury    No current facility-administered medications for this encounter.    Adhesive [tape]; Codeine; Macrobid [nitrofurantoin monohyd macro]; and Neosporin [neomycin-bacitracin zn-polymyx] Family History  Problem Relation Age of Onset  . Alcohol abuse Brother   . Drug abuse Other    Social History:   reports that she has never smoked. She has never used smokeless tobacco. She reports that  she does not drink alcohol or use drugs.   REVIEW OF SYSTEMS : Negative except for see problem list.    Physical Exam:   There were no vitals taken for this visit. There is no height or weight on file to calculate BMI.  Gen:  WDWN WF NAD  Neurological: Alert and oriented to person, place, and time. Motor and sensory function is grossly intact  Head: Normocephalic and atraumatic.  Eyes: Conjunctivae are normal. Pupils are equal, round, and reactive to light. No scleral icterus.  Neck: Normal range of motion. Neck supple. No tracheal deviation or thyromegaly present.  Cardiovascular:  SR without murmurs or gallops.  No carotid bruits Breast:  Not examined Respiratory: Effort normal.  No respiratory distress. No chest wall tenderness. Breath sounds normal.  No wheezes, rales or rhonchi.  Abdomen:  nontender  GU:  Not exained Musculoskeletal: Normal range of motion. Extremities are nontender. No cyanosis, edema or clubbing noted Lymphadenopathy: No cervical, preauricular, postauricular or axillary adenopathy is present Skin: Skin is warm and dry. No rash noted. No diaphoresis. No erythema. No pallor. Pscyh: Normal mood and affect. Behavior is normal. Judgment and thought content normal.   LABORATORY RESULTS: No results  found for this or any previous visit (from the past 48 hour(s)).   RADIOLOGY RESULTS: No results found.  Problem List: Patient Active Problem List   Diagnosis Date Noted  . Major depression 03/31/2015  . Back pain 10/23/2013    Assessment & Plan: Recurrent GER after Nissen elsewhere;  Plan endoscopy to assess anatomy    Matt B. Daphine Deutscher, MD, [facility] [phone] beeper [phone]  12/26/2016 10:50 AM

## 2016-12-26 NOTE — Transfer of Care (Signed)
Immediate Anesthesia Transfer of Care Note  Patient: [name]  Procedure(s) Performed: Procedure(s): ESOPHAGOGASTRODUODENOSCOPY (EGD) WITH PROPOFOL (N/A)  Patient Location: PACU  Anesthesia Type:MAC  Level of Consciousness: Patient easily awoken, sedated, comfortable, cooperative, following commands, responds to stimulation.   Airway & Oxygen Therapy: Patient spontaneously breathing, ventilating well, oxygen via simple oxygen mask.  Post-op Assessment: Report given to PACU RN, vital signs reviewed and stable, moving all extremities.   Post vital signs: Reviewed and stable.  Complications: No apparent anesthesia complications Last Vitals:  Vitals:   12/26/16 1056  BP: 129/78  Pulse: 88  Resp: 18  Temp: 36.8 C    Last Pain:  Vitals:   12/26/16 1056  TempSrc: Oral         Complications: No apparent anesthesia complications

## 2016-12-26 NOTE — Op Note (Signed)
Surgeon: Kaylyn Lim, MD, FACS  Asst:  Alphonsa Overall, MD, FACS  Anes:  MAC  Preop Dx: GERD post Nissen in [place] Postop Dx: Same with Barrett's or esophagitis  Procedure: EGD Location Surgery: [facility] endoscopy Complications: none  EBL:   3 cc  Drains: none  Description of Procedure:  The patient was taken to OR 1 at [facility] .  After anesthesia was administered and the patient was prepped a timeout was performed.  The endoscope was pass easily in to the esophagus.  The GE junction was at 38 and there were tongues of esophagitis or Barrett's at this level.  The scope was passed in to the stomach and retroflexed.  No obvious anatomic aberrations were noted except consistent with Nissen.  Concern is that there may be torqueing of the distal esophagus.    The scope was passed into the duodenum which appeared normal.  CLO biopsies were obtained in the antrum.  The EG junction and the areas of esophagitis were biopsied x 3 and sent for permanents.  No strictures were noted in the esophagus.  No recurrent hiatal hernia was seen.  I suspect that the source of dysphagia may be twisting at the EG junction.    The patient tolerated the procedure well and was taken to the PACU in stable condition.     Matt B. Hassell Done, [place], [facility], [place]

## 2016-12-26 NOTE — Anesthesia Preprocedure Evaluation (Signed)
Anesthesia Evaluation  Patient identified by MRN, date of birth, ID band Patient awake    Reviewed: Allergy & Precautions, NPO status , Patient's Chart, lab work & pertinent test results  Airway Mallampati: II  TM Distance: <3 FB Neck ROM: Full    Dental no notable dental hx.    Pulmonary sleep apnea and Oxygen sleep apnea ,    Pulmonary exam normal breath sounds clear to auscultation       Cardiovascular hypertension, Normal cardiovascular exam Rhythm:Regular Rate:Normal     Neuro/Psych negative neurological ROS  negative psych ROS   GI/Hepatic negative GI ROS, Neg liver ROS,   Endo/Other  diabetesMorbid obesity  Renal/GU negative Renal ROS  negative genitourinary   Musculoskeletal negative musculoskeletal ROS (+)   Abdominal   Peds negative pediatric ROS (+)  Hematology negative hematology ROS (+)   Anesthesia Other Findings   Reproductive/Obstetrics negative OB ROS                             Anesthesia Physical Anesthesia Plan  ASA: III  Anesthesia Plan: MAC   Post-op Pain Management:    Induction: Intravenous  Airway Management Planned: Simple Face Mask  Additional Equipment:   Intra-op Plan:   Post-operative Plan:   Informed Consent: I have reviewed the patients History and Physical, chart, labs and discussed the procedure including the risks, benefits and alternatives for the proposed anesthesia with the patient or authorized representative who has indicated his/her understanding and acceptance.   Dental advisory given  Plan Discussed with: CRNA and Surgeon  Anesthesia Plan Comments:         Anesthesia Quick Evaluation

## 2016-12-26 NOTE — Discharge Instructions (Signed)
Esophagogastroduodenoscopy, Care After °Introduction °Refer to this sheet in the next few weeks. These instructions provide you with information about caring for yourself after your procedure. Your health care provider may also give you more specific instructions. Your treatment has been planned according to current medical practices, but problems sometimes occur. Call your health care provider if you have any problems or questions after your procedure. °What can I expect after the procedure? °After the procedure, it is common to have: °· A sore throat. °· Nausea. °· Bloating. °· Dizziness. °· Fatigue. °Follow these instructions at home: °· Do not eat or drink anything until the numbing medicine (local anesthetic) has worn off and your gag reflex has returned. You will know that the local anesthetic has worn off when you can swallow comfortably. °· Do not drive for 24 hours if you received a medicine to help you relax (sedative). °· If your health care provider took a tissue sample for testing during the procedure, make sure to get your test results. This is your responsibility. Ask your health care provider or the department performing the test when your results will be ready. °· Keep all follow-up visits as told by your health care provider. This is important. °Contact a health care provider if: °· You cannot stop coughing. °· You are not urinating. °· You are urinating less than usual. °Get help right away if: °· You have trouble swallowing. °· You cannot eat or drink. °· You have throat or chest pain that gets worse. °· You are dizzy or light-headed. °· You faint. °· You have nausea or vomiting. °· You have chills. °· You have a fever. °· You have severe abdominal pain. °· You have black, tarry, or bloody stools. °This information is not intended to replace advice given to you by your health care provider. Make sure you discuss any questions you have with your health care provider. °Document Released: 10/17/2012  Document Revised: 04/07/2016 Document Reviewed: 09/24/2015 °© 2017 Elsevier ° °

## 2016-12-26 NOTE — Anesthesia Postprocedure Evaluation (Signed)
Anesthesia Post Note  Patient: [name]  Procedure(s) Performed: Procedure(s) (LRB): ESOPHAGOGASTRODUODENOSCOPY (EGD) WITH PROPOFOL (N/A)  Patient location during evaluation: PACU Anesthesia Type: MAC Level of consciousness: awake and alert Pain management: pain level controlled Vital Signs Assessment: post-procedure vital signs reviewed and stable Respiratory status: spontaneous breathing, nonlabored ventilation, respiratory function stable and patient connected to nasal cannula oxygen Cardiovascular status: stable and blood pressure returned to baseline Anesthetic complications: no       Last Vitals:  Vitals:   12/26/16 1210 12/26/16 1220  BP: 136/80 134/83  Pulse: 71 85  Resp: 12 18  Temp:      Last Pain:  Vitals:   12/26/16 1156  TempSrc: Oral                 Sy Saintjean S

## 2016-12-27 LAB — CLOTEST (H. PYLORI), BIOPSY: Helicobacter screen: NEGATIVE

## 2016-12-30 ENCOUNTER — Encounter (HOSPITAL_COMMUNITY): Payer: Self-pay | Admitting: Surgery

## 2017-01-09 NOTE — Progress Notes (Signed)
Need orders in epic for 01-16-17 surgery asap

## 2017-01-10 ENCOUNTER — Telehealth (HOSPITAL_COMMUNITY): Payer: Self-pay | Admitting: *Deleted

## 2017-01-10 NOTE — Telephone Encounter (Signed)
PHONE CALL FROM PATIENT CANCELLED HER APPOINTMENT FOR 01/19/17 DUE TO SURGERY.   SHE SAID SHE HAS ANOTHER REFILL.   SHE SCHEDULED OUT TO April.

## 2017-01-13 ENCOUNTER — Encounter (HOSPITAL_COMMUNITY)
Admission: RE | Admit: 2017-01-13 | Discharge: 2017-01-13 | Disposition: A | Discharge status: Home / Self Care | Payer: Medicare HMO | Source: Ambulatory Visit | Attending: Surgery | Admitting: Surgery

## 2017-01-13 ENCOUNTER — Inpatient Hospital Stay (HOSPITAL_COMMUNITY)
Admission: RE | Admit: 2017-01-13 | Discharge: 2017-01-13 | Disposition: A | Discharge status: Home / Self Care | Payer: Self-pay | Source: Ambulatory Visit

## 2017-01-13 ENCOUNTER — Encounter (HOSPITAL_COMMUNITY): Payer: Self-pay

## 2017-01-13 ENCOUNTER — Ambulatory Visit: Payer: Self-pay | Admitting: Surgery

## 2017-01-13 DIAGNOSIS — R1310 Dysphagia, unspecified: Secondary | ICD-10-CM | POA: Insufficient documentation

## 2017-01-13 DIAGNOSIS — Z79899 Other long term (current) drug therapy: Secondary | ICD-10-CM | POA: Diagnosis not present

## 2017-01-13 DIAGNOSIS — Z01818 Encounter for other preprocedural examination: Secondary | ICD-10-CM | POA: Insufficient documentation

## 2017-01-13 LAB — GLUCOSE, CAPILLARY: GLUCOSE-CAPILLARY: 125 mg/dL — AB (ref 65–99)

## 2017-01-13 NOTE — Progress Notes (Signed)
CBC, hgba1c,cmp from 2/19

## 2017-01-13 NOTE — H&P (Signed)
Chief Complaint:  Dysphagia after Nissen in [place]  History of Present Illness:  [name] is an 51 y.o. female who presents for redo Nissen fundoplication.  Her original surgery was performed in [place] and she has had persistent dysphagia since then.    Past Medical History:  Diagnosis Date  . Anemia   . Anxiety   . Arthritis   . Chronic kidney disease    IGA  . Complication of anesthesia   . Depression   . Diabetes mellitus without complication (HCC)    type II   . GERD (gastroesophageal reflux disease)   . Heart murmur    since birth- no need to be concern  . History of kidney stones    hx of   . Hypertension   . PONV (postoperative nausea and vomiting)   . Shortness of breath    With exertion  . Sleep apnea    severe sleep apnea uses oxygen 2l at nite     Past Surgical History:  Procedure Laterality Date  . ABDOMINAL HYSTERECTOMY    . BREAST SURGERY Right    biospy x2  . devaited spetum surgery     . DILATION AND CURETTAGE OF UTERUS    . ESOPHAGOGASTRODUODENOSCOPY (EGD) WITH PROPOFOL N/A 12/26/2016   Procedure: ESOPHAGOGASTRODUODENOSCOPY (EGD) WITH PROPOFOL;  Surgeon: Luretha Murphy, MD;  Location: [facility];  Service: General;  Laterality: N/A;  . LUMBAR LAMINECTOMY/DECOMPRESSION MICRODISCECTOMY  10/23/2013   L4 L5   DR Shon Baton  . LUMBAR LAMINECTOMY/DECOMPRESSION MICRODISCECTOMY N/A 10/23/2013   Procedure: DECOMPRESSION AND FACET REMOVAL L4 - L5 1 LEVEL;  Surgeon: Venita Lick, MD;  Location: [facility];  Service: Orthopedics;  Laterality: N/A;  . nasal repair collapse      both sides of nose and 2 plates added   . NASAL SINUS SURGERY    . NISSEN FUNDOPLICATION    . right foot surgery     . SEPTOPLASTY  2013  . SHOULDER ARTHROSCOPY WITH ROTATOR CUFF REPAIR Right    x 2  . WRIST SURGERY Left    work injury    Current Outpatient Prescriptions  Medication Sig Dispense Refill  . acetaminophen (TYLENOL) 500 MG tablet Take 1,000 mg by mouth every 6 (six) hours  as needed (pain).     Marland Kitchen albuterol (PROVENTIL HFA;VENTOLIN HFA) 108 (90 BASE) MCG/ACT inhaler Inhale 2 puffs into the lungs every 6 (six) hours as needed for wheezing or shortness of breath.    . ALPRAZolam (XANAX) 1 MG tablet Take 1 tablet (1 mg total) by mouth 3 (three) times daily. (Patient taking differently: Take 1 mg by mouth 3 (three) times daily as needed for anxiety. ) 90 tablet 2  . Cholecalciferol (VITAMIN D-3) 5000 units TABS Take 5,000 Units by mouth daily.    . DULoxetine (CYMBALTA) 60 MG capsule Take 1 capsule (60 mg total) by mouth daily. 90 capsule 2  . folic acid (FOLVITE) 1 MG tablet Take 1 mg by mouth 2 (two) times daily.    . furosemide (LASIX) 20 MG tablet Take 20 mg by mouth daily.    . metFORMIN (GLUCOPHAGE) 1000 MG tablet Take 1,000 mg by mouth 2 (two) times daily with a meal.    . metoprolol succinate (TOPROL-XL) 100 MG 24 hr tablet Take 100 mg by mouth daily. Take with or immediately following a meal.    . ondansetron (ZOFRAN) 4 MG tablet Take 4 mg by mouth every 8 (eight) hours as needed for nausea or vomiting.    Marland Kitchen  oxyCODONE-acetaminophen (PERCOCET) 10-325 MG per tablet Take 1 tablet by mouth 2 (two) times daily.     . OXYGEN Inhale 2 L into the lungs at bedtime.    . pantoprazole (PROTONIX) 40 MG tablet Take 80 mg by mouth daily.     . potassium chloride SA (K-DUR,KLOR-CON) 20 MEQ tablet Take 20 mEq by mouth 3 (three) times daily.    . ranitidine (ZANTAC) 300 MG tablet Take 300 mg by mouth at bedtime.    Marland Kitchen. spironolactone-hydrochlorothiazide (ALDACTAZIDE) 25-25 MG tablet Take 1 tablet by mouth daily.    . vitamin B-12 (CYANOCOBALAMIN) 1000 MCG tablet Take 1,000 mcg by mouth daily.    Marland Kitchen. zolpidem (AMBIEN) 10 MG tablet Take 1 tablet (10 mg total) by mouth at bedtime. 30 tablet 2   No current facility-administered medications for this visit.    Adhesive [tape]; Codeine; Macrobid [nitrofurantoin monohyd macro]; and Neosporin [neomycin-bacitracin zn-polymyx] Family History   Problem Relation Age of Onset  . Alcohol abuse Brother   . Drug abuse Other    Social History:   reports that she has never smoked. She has never used smokeless tobacco. She reports that she does not drink alcohol or use drugs.   REVIEW OF SYSTEMS : Negative except for see problem list  Physical Exam:   There were no vitals taken for this visit. There is no height or weight on file to calculate BMI.  Gen:  WDWN WF NAD  Neurological: Alert and oriented to person, place, and time. Motor and sensory function is grossly intact  Head: Normocephalic and atraumatic.  Eyes: Conjunctivae are normal. Pupils are equal, round, and reactive to light. No scleral icterus.  Neck: Normal range of motion. Neck supple. No tracheal deviation or thyromegaly present.  Cardiovascular:  SR without murmurs or gallops.  No carotid bruits Breast:  Not examined Respiratory: Effort normal.  No respiratory distress. No chest wall tenderness. Breath sounds normal.  No wheezes, rales or rhonchi.  Abdomen:  nontender with well healed incisions GU:  Not examined Musculoskeletal: Normal range of motion. Extremities are nontender. No cyanosis, edema or clubbing noted Lymphadenopathy: No cervical, preauricular, postauricular or axillary adenopathy is present Skin: Skin is warm and dry. No rash noted. No diaphoresis. No erythema. No pallor. Pscyh: Normal mood and affect. Behavior is normal. Judgment and thought content normal.   LABORATORY RESULTS: No results found for this or any previous visit (from the past 48 hour(s)).   RADIOLOGY RESULTS: No results found.  Problem List: Patient Active Problem List   Diagnosis Date Noted  . Major depression 03/31/2015  . Back pain 10/23/2013    Assessment & Plan: Dysphagia post nissen.  Suspect torqueing of the wrap with functional obstruction.   Plan laparoscopic revision    Matt B. Daphine DeutscherMartin, MD, [facility]  [facility] [phone]  beeper [phone]  01/13/2017 11:26 AM

## 2017-01-13 NOTE — Pre-Procedure Instructions (Signed)
EKG-11/25/16-epic  

## 2017-01-13 NOTE — Patient Instructions (Signed)
[name]  01/13/2017   Your procedure is scheduled on: 01-16-17  [facility]  [facility] to 3rd floor to  Short Stay Center at 8:25 AM.  Call this number if you have problems the morning of surgery [phone]   Remember: ONLY 1 PERSON MAY GO WITH YOU TO SHORT STAY TO GET  READY MORNING OF YOUR SURGERY.  Do not eat food or drink liquids :After Midnight.     Take these medicines the morning of surgery with A SIP OF WATER: Duloxetine, Metoprolol, Pantoprazole, Oxycodone-Acetaminophen, Albuterol inhaler if needed, Alprazolam if needed. DO NOT TAKE ANY DIABETIC MEDICATIONS DAY OF YOUR SURGERY                               You may not have any metal on your body including hair pins and              piercings  Do not wear jewelry, make-up, lotions, powders or perfumes, deodorant             Do not wear nail polish.  Do not shave  48 hours prior to surgery.              Men may shave face and neck.   Do not bring valuables to the hospital. [facility] IS NOT             RESPONSIBLE   FOR VALUABLES.  Contacts, dentures or bridgework may not be worn into surgery.  Leave suitcase in the car. After surgery it may be brought to your room.               Please read over the following fact sheets you were given: _____________________________________________________________________             [facility] - Preparing for Surgery Before surgery, you can play an important role.  Because skin is not sterile, your skin needs to be as free of germs as possible.  You can reduce the number of germs on your skin by washing with CHG (chlorahexidine gluconate) soap before surgery.  CHG is an antiseptic cleaner which kills germs and bonds with the skin to continue killing germs even after washing. Please DO NOT use if you have an allergy to CHG or antibacterial soaps.  If your skin becomes reddened/irritated stop using the CHG and inform your nurse  when you arrive at Short Stay. Do not shave (including legs and underarms) for at least 48 hours prior to the first CHG shower.  You may shave your face/neck. Please follow these instructions carefully:  1.  Shower with CHG Soap the night before surgery and the  morning of Surgery.  2.  If you choose to wash your hair, wash your hair first as usual with your  normal  shampoo.  3.  After you shampoo, rinse your hair and body thoroughly to remove the  shampoo.                           4.  Use CHG as you would any other liquid soap.  You can apply chg directly  to the skin and wash  Gently with a scrungie or clean washcloth.  5.  Apply the CHG Soap to your body ONLY FROM THE NECK DOWN.   Do not use on face/ open                           Wound or open sores. Avoid contact with eyes, ears mouth and genitals (private parts).                       Wash face,  Genitals (private parts) with your normal soap.             6.  Wash thoroughly, paying special attention to the area where your surgery  will be performed.  7.  Thoroughly rinse your body with warm water from the neck down.  8.  DO NOT shower/wash with your normal soap after using and rinsing off  the CHG Soap.                9.  Pat yourself dry with a clean towel.            10.  Wear clean pajamas.            11.  Place clean sheets on your bed the night of your first shower and do not  sleep with pets. Day of Surgery : Do not apply any lotions/deodorants the morning of surgery.  Please wear clean clothes to the hospital/surgery center.  FAILURE TO FOLLOW THESE INSTRUCTIONS MAY RESULT IN THE CANCELLATION OF YOUR SURGERY PATIENT SIGNATURE_________________________________  NURSE SIGNATURE__________________________________  ________________________________________________________________________

## 2017-01-13 NOTE — Patient Instructions (Addendum)
[name]  01/13/2017   Your procedure is scheduled on:   [facility]  [facility] to 3rd floor to  Short Stay Center at AM.  Call this number if you have problems the morning of surgery [phone]   Remember: ONLY 1 PERSON MAY GO WITH YOU TO SHORT STAY TO GET  READY MORNING OF YOUR SURGERY.  Do not eat food or drink liquids :After Midnight.     Take these medicines the morning of surgery with A SIP OF WATER: METOPROLOL, PANTAPRAZOLE (PROTONIX), ALBUTEROL INHALER IF NEEDED AND BRING INHALER, ALPRAZOLAM (XANAX) IF NEEDED, DULOXETINE ( CYMBALTA), OCXYCODONE IF NEEDED                               You may not have any metal on your body including hair pins and              piercings  Do not wear jewelry, make-up, lotions, powders or perfumes, deodorant             Do not wear nail polish.  Do not shave  48 hours prior to surgery.              Men may shave face and neck.   Do not bring valuables to the hospital. [facility] IS NOT             RESPONSIBLE   FOR VALUABLES.  Contacts, dentures or bridgework may not be worn into surgery.  Leave suitcase in the car. After surgery it may be brought to your room.                Please read over the following fact sheets you were given: _____________________________________________________________________  How to Manage Your Diabetes Before and After Surgery  Why is it important to control my blood sugar before and after surgery? . Improving blood sugar levels before and after surgery helps healing and can limit problems. . A way of improving blood sugar control is eating a healthy diet by: o  Eating less sugar and carbohydrates o  Increasing activity/exercise o  Talking with your doctor about reaching your blood sugar goals . High blood sugars (greater than 180 mg/dL) can raise your risk of infections and slow your recovery, so you will need to focus on controlling your diabetes  during the weeks before surgery. . Make sure that the doctor who takes care of your diabetes knows about your planned surgery including the date and location.  How do I manage my blood sugar before surgery? . Check your blood sugar at least 4 times a day, starting 2 days before surgery, to make sure that the level is not too high or low. o Check your blood sugar the morning of your surgery when you wake up and every 2 hours until you get to the Short Stay unit. . If your blood sugar is less than 70 mg/dL, you will need to treat for low blood sugar: o Do not take insulin. o Treat a low blood sugar (less than 70 mg/dL) with  cup of clear juice (cranberry or apple), 4 glucose tablets, OR glucose gel. o Recheck blood sugar in 15 minutes after treatment (to make sure it is greater than 70 mg/dL). If your blood sugar is not greater than 70 mg/dL on recheck,  call [phone] for further instructions. . Report your blood sugar to the short stay nurse when you get to Short Stay.  . If you are admitted to the hospital after surgery: o Your blood sugar will be checked by the staff and you will probably be given insulin after surgery (instead of oral diabetes medicines) to make sure you have good blood sugar levels. o The goal for blood sugar control after surgery is 80-180 mg/dL.   WHAT DO I DO ABOUT MY DIABETES MEDICATION?  YOU MAY TAKE YOUR METFORMIN AS USUAL DAY BEFORE SURGERY ON 01-15-17 DO NOT TAKE UOUR METFORMIN ON DAY OF SURGERY 01-16-17    Patient Signature:  Date:   Nurse Signature:  Date:   Reviewed and Endorsed by [facility] Patient Education Committee, August 2015           [facility] - Preparing for Surgery Before surgery, you can play an important role.  Because skin is not sterile, your skin needs to be as free of germs as possible.  You can reduce the number of germs on your skin by washing with CHG (chlorahexidine gluconate) soap before surgery.  CHG is an antiseptic cleaner  which kills germs and bonds with the skin to continue killing germs even after washing. Please DO NOT use if you have an allergy to CHG or antibacterial soaps.  If your skin becomes reddened/irritated stop using the CHG and inform your nurse when you arrive at Short Stay. Do not shave (including legs and underarms) for at least 48 hours prior to the first CHG shower.  You may shave your face/neck. Please follow these instructions carefully:  1.  Shower with CHG Soap the night before surgery and the  morning of Surgery.  2.  If you choose to wash your hair, wash your hair first as usual with your  normal  shampoo.  3.  After you shampoo, rinse your hair and body thoroughly to remove the  shampoo.                           4.  Use CHG as you would any other liquid soap.  You can apply chg directly  to the skin and wash                       Gently with a scrungie or clean washcloth.  5.  Apply the CHG Soap to your body ONLY FROM THE NECK DOWN.   Do not use on face/ open                           Wound or open sores. Avoid contact with eyes, ears mouth and genitals (private parts).                       Wash face,  Genitals (private parts) with your normal soap.             6.  Wash thoroughly, paying special attention to the area where your surgery  will be performed.  7.  Thoroughly rinse your body with warm water from the neck down.  8.  DO NOT shower/wash with your normal soap after using and rinsing off  the CHG Soap.                9.  Pat yourself dry with a clean towel.  10.  Wear clean pajamas.            11.  Place clean sheets on your bed the night of your first shower and do not  sleep with pets. Day of Surgery : Do not apply any lotions/deodorants the morning of surgery.  Please wear clean clothes to the hospital/surgery center.

## 2017-01-16 ENCOUNTER — Encounter (HOSPITAL_COMMUNITY): Payer: Self-pay

## 2017-01-16 ENCOUNTER — Encounter (HOSPITAL_COMMUNITY)
Admission: AD | Disposition: A | Discharge status: Home / Self Care | Payer: Self-pay | Source: Ambulatory Visit | Attending: Surgery

## 2017-01-16 ENCOUNTER — Ambulatory Visit (HOSPITAL_COMMUNITY): Payer: Medicare HMO | Admitting: Anesthesiology

## 2017-01-16 ENCOUNTER — Inpatient Hospital Stay (HOSPITAL_COMMUNITY)
Admission: AD | Admit: 2017-01-16 | Discharge: 2017-01-19 | DRG: 909 | Disposition: A | Discharge status: Home / Self Care | Payer: Medicare HMO | Source: Ambulatory Visit | Attending: Surgery | Admitting: Surgery

## 2017-01-16 DIAGNOSIS — I10 Essential (primary) hypertension: Secondary | ICD-10-CM | POA: Diagnosis present

## 2017-01-16 DIAGNOSIS — Y838 Other surgical procedures as the cause of abnormal reaction of the patient, or of later complication, without mention of misadventure at the time of the procedure: Secondary | ICD-10-CM | POA: Diagnosis present

## 2017-01-16 DIAGNOSIS — R079 Chest pain, unspecified: Secondary | ICD-10-CM

## 2017-01-16 DIAGNOSIS — Z91048 Other nonmedicinal substance allergy status: Secondary | ICD-10-CM

## 2017-01-16 DIAGNOSIS — K219 Gastro-esophageal reflux disease without esophagitis: Secondary | ICD-10-CM | POA: Diagnosis present

## 2017-01-16 DIAGNOSIS — Z885 Allergy status to narcotic agent status: Secondary | ICD-10-CM

## 2017-01-16 DIAGNOSIS — F329 Major depressive disorder, single episode, unspecified: Secondary | ICD-10-CM | POA: Diagnosis present

## 2017-01-16 DIAGNOSIS — Z7984 Long term (current) use of oral hypoglycemic drugs: Secondary | ICD-10-CM

## 2017-01-16 DIAGNOSIS — G4730 Sleep apnea, unspecified: Secondary | ICD-10-CM | POA: Diagnosis present

## 2017-01-16 DIAGNOSIS — T8189XA Other complications of procedures, not elsewhere classified, initial encounter: Principal | ICD-10-CM | POA: Diagnosis present

## 2017-01-16 DIAGNOSIS — E119 Type 2 diabetes mellitus without complications: Secondary | ICD-10-CM | POA: Diagnosis present

## 2017-01-16 DIAGNOSIS — K2270 Barrett's esophagus without dysplasia: Secondary | ICD-10-CM | POA: Diagnosis present

## 2017-01-16 DIAGNOSIS — Z9981 Dependence on supplemental oxygen: Secondary | ICD-10-CM

## 2017-01-16 DIAGNOSIS — Z881 Allergy status to other antibiotic agents status: Secondary | ICD-10-CM

## 2017-01-16 DIAGNOSIS — R1310 Dysphagia, unspecified: Secondary | ICD-10-CM

## 2017-01-16 HISTORY — PX: LAPAROSCOPIC NISSEN FUNDOPLICATION: SHX1932

## 2017-01-16 LAB — SURGICAL PCR SCREEN
MRSA, PCR: POSITIVE — AB
STAPHYLOCOCCUS AUREUS: POSITIVE — AB

## 2017-01-16 LAB — CBC
HCT: 37.5 % (ref 36.0–46.0)
Hemoglobin: 12.3 g/dL (ref 12.0–15.0)
MCH: 24.5 pg — ABNORMAL LOW (ref 26.0–34.0)
MCHC: 32.8 g/dL (ref 30.0–36.0)
MCV: 74.7 fL — ABNORMAL LOW (ref 78.0–100.0)
PLATELETS: 411 10*3/uL — AB (ref 150–400)
RBC: 5.02 MIL/uL (ref 3.87–5.11)
RDW: 14.9 % (ref 11.5–15.5)
WBC: 19.9 10*3/uL — AB (ref 4.0–10.5)

## 2017-01-16 LAB — GLUCOSE, CAPILLARY
Glucose-Capillary: 128 mg/dL — ABNORMAL HIGH (ref 65–99)
Glucose-Capillary: 192 mg/dL — ABNORMAL HIGH (ref 65–99)
Glucose-Capillary: 214 mg/dL — ABNORMAL HIGH (ref 65–99)
Glucose-Capillary: 220 mg/dL — ABNORMAL HIGH (ref 65–99)
Glucose-Capillary: 228 mg/dL — ABNORMAL HIGH (ref 65–99)

## 2017-01-16 LAB — CREATININE, SERUM
CREATININE: 0.96 mg/dL (ref 0.44–1.00)
GFR calc Af Amer: >60 mL/min (ref >60)

## 2017-01-16 SURGERY — FUNDOPLICATION, NISSEN, LAPAROSCOPIC
Anesthesia: General | Site: Abdomen

## 2017-01-16 MED ORDER — LACTATED RINGERS IR SOLN
Status: DC | PRN
  Administered 2017-01-16: 2000 mL

## 2017-01-16 MED ORDER — CHLORHEXIDINE GLUCONATE CLOTH 2 % EX PADS: 6.0000 | MEDICATED_PAD | Freq: Once | CUTANEOUS | Status: DC

## 2017-01-16 MED ORDER — HYDROMORPHONE HCL 1 MG/ML IJ SOLN
INTRAMUSCULAR | Status: DC | PRN
Start: 2017-01-16 — End: 2017-01-16
  Administered 2017-01-16 (×2): 1 mg via INTRAVENOUS

## 2017-01-16 MED ORDER — ACETAMINOPHEN 500 MG PO TABS
1000.0000 mg | ORAL_TABLET | ORAL | Status: AC
  Administered 2017-01-16: 1000 mg via ORAL
  Filled 2017-01-16: qty 2

## 2017-01-16 MED ORDER — DEXAMETHASONE SODIUM PHOSPHATE 10 MG/ML IJ SOLN
INTRAMUSCULAR | Status: DC | PRN
  Administered 2017-01-16: 10 mg via INTRAVENOUS

## 2017-01-16 MED ORDER — ONDANSETRON 4 MG PO TBDP: 4.0000 mg | ORAL_TABLET | Freq: Four times a day (QID) | ORAL | Status: DC | PRN

## 2017-01-16 MED ORDER — KCL IN DEXTROSE-NACL 20-5-0.45 MEQ/L-%-% IV SOLN
INTRAVENOUS | Status: DC
  Administered 2017-01-16: 16:00:00 via INTRAVENOUS
  Administered 2017-01-17: 1000 mL via INTRAVENOUS
  Administered 2017-01-17 – 2017-01-18 (×4): via INTRAVENOUS
  Administered 2017-01-18: 1000 mL via INTRAVENOUS
  Administered 2017-01-19: 04:00:00 via INTRAVENOUS
  Filled 2017-01-16 (×9): qty 1000

## 2017-01-16 MED ORDER — LACTATED RINGERS IV SOLN
INTRAVENOUS | Status: DC
  Administered 2017-01-16: 1000 mL via INTRAVENOUS

## 2017-01-16 MED ORDER — METOCLOPRAMIDE HCL 5 MG/ML IJ SOLN
5.0000 mg | Freq: Once | INTRAMUSCULAR | Status: AC | PRN
  Administered 2017-01-16: 5 mg via INTRAVENOUS

## 2017-01-16 MED ORDER — GABAPENTIN 300 MG PO CAPS
300.0000 mg | ORAL_CAPSULE | ORAL | Status: AC
  Administered 2017-01-16: 300 mg via ORAL
  Filled 2017-01-16: qty 1

## 2017-01-16 MED ORDER — ROCURONIUM BROMIDE 50 MG/5ML IV SOSY
PREFILLED_SYRINGE | INTRAVENOUS | Status: AC
  Filled 2017-01-16: qty 5

## 2017-01-16 MED ORDER — FENTANYL CITRATE (PF) 250 MCG/5ML IJ SOLN
INTRAMUSCULAR | Status: AC
  Filled 2017-01-16: qty 5

## 2017-01-16 MED ORDER — ROCURONIUM BROMIDE 10 MG/ML (PF) SYRINGE
PREFILLED_SYRINGE | INTRAVENOUS | Status: DC | PRN
  Administered 2017-01-16 (×2): 20 mg via INTRAVENOUS
  Administered 2017-01-16: 40 mg via INTRAVENOUS
  Administered 2017-01-16 (×2): 10 mg via INTRAVENOUS

## 2017-01-16 MED ORDER — PANTOPRAZOLE SODIUM 40 MG IV SOLR
40.0000 mg | Freq: Every day | INTRAVENOUS | Status: DC
  Administered 2017-01-16 – 2017-01-18 (×3): 40 mg via INTRAVENOUS
  Filled 2017-01-16 (×3): qty 40

## 2017-01-16 MED ORDER — KCL IN DEXTROSE-NACL 20-5-0.45 MEQ/L-%-% IV SOLN
INTRAVENOUS | Status: AC
  Filled 2017-01-16: qty 1000

## 2017-01-16 MED ORDER — HEPARIN SODIUM (PORCINE) 5000 UNIT/ML IJ SOLN
5000.0000 [IU] | Freq: Once | INTRAMUSCULAR | Status: AC
  Administered 2017-01-16: 5000 [IU] via SUBCUTANEOUS
  Filled 2017-01-16: qty 1

## 2017-01-16 MED ORDER — BOOST / RESOURCE BREEZE PO LIQD
1.0000 | Freq: Three times a day (TID) | ORAL | Status: DC
  Administered 2017-01-16 – 2017-01-18 (×5): 1 via ORAL

## 2017-01-16 MED ORDER — SUCCINYLCHOLINE CHLORIDE 200 MG/10ML IV SOSY
PREFILLED_SYRINGE | INTRAVENOUS | Status: DC | PRN
  Administered 2017-01-16: 140 mg via INTRAVENOUS

## 2017-01-16 MED ORDER — HEPARIN SODIUM (PORCINE) 5000 UNIT/ML IJ SOLN
5000.0000 [IU] | Freq: Three times a day (TID) | INTRAMUSCULAR | Status: DC
  Administered 2017-01-16 – 2017-01-19 (×8): 5000 [IU] via SUBCUTANEOUS
  Filled 2017-01-16 (×6): qty 1

## 2017-01-16 MED ORDER — SUGAMMADEX SODIUM 500 MG/5ML IV SOLN
INTRAVENOUS | Status: DC | PRN
  Administered 2017-01-16: 250 mg via INTRAVENOUS

## 2017-01-16 MED ORDER — FENTANYL CITRATE (PF) 100 MCG/2ML IJ SOLN
12.5000 ug | INTRAMUSCULAR | Status: DC | PRN
  Administered 2017-01-16 – 2017-01-19 (×10): 12.5 ug via INTRAVENOUS
  Filled 2017-01-16 (×11): qty 2

## 2017-01-16 MED ORDER — SUCCINYLCHOLINE CHLORIDE 200 MG/10ML IV SOSY
PREFILLED_SYRINGE | INTRAVENOUS | Status: AC
  Filled 2017-01-16: qty 10

## 2017-01-16 MED ORDER — CELECOXIB 200 MG PO CAPS
400.0000 mg | ORAL_CAPSULE | ORAL | Status: AC
  Administered 2017-01-16: 400 mg via ORAL
  Filled 2017-01-16: qty 2

## 2017-01-16 MED ORDER — FENTANYL CITRATE (PF) 100 MCG/2ML IJ SOLN: 25.0000 ug | INTRAMUSCULAR | Status: DC | PRN

## 2017-01-16 MED ORDER — 0.9 % SODIUM CHLORIDE (POUR BTL) OPTIME
TOPICAL | Status: DC | PRN
  Administered 2017-01-16: 1000 mL

## 2017-01-16 MED ORDER — ONDANSETRON HCL 4 MG/2ML IJ SOLN
INTRAMUSCULAR | Status: DC | PRN
  Administered 2017-01-16: 4 mg via INTRAVENOUS

## 2017-01-16 MED ORDER — MIDAZOLAM HCL 2 MG/2ML IJ SOLN
INTRAMUSCULAR | Status: AC
  Filled 2017-01-16: qty 2

## 2017-01-16 MED ORDER — PROPOFOL 10 MG/ML IV BOLUS
INTRAVENOUS | Status: AC
  Filled 2017-01-16: qty 20

## 2017-01-16 MED ORDER — PROPOFOL 10 MG/ML IV BOLUS
INTRAVENOUS | Status: DC | PRN
Start: 2017-01-16 — End: 2017-01-16
  Administered 2017-01-16: 180 mg via INTRAVENOUS

## 2017-01-16 MED ORDER — LIDOCAINE 2% (20 MG/ML) 5 ML SYRINGE
INTRAMUSCULAR | Status: AC
  Filled 2017-01-16: qty 5

## 2017-01-16 MED ORDER — ONDANSETRON HCL 4 MG/2ML IJ SOLN
INTRAMUSCULAR | Status: AC
  Filled 2017-01-16: qty 2

## 2017-01-16 MED ORDER — DEXAMETHASONE SODIUM PHOSPHATE 10 MG/ML IJ SOLN
INTRAMUSCULAR | Status: AC
  Filled 2017-01-16: qty 1

## 2017-01-16 MED ORDER — LIDOCAINE 2% (20 MG/ML) 5 ML SYRINGE
INTRAMUSCULAR | Status: DC | PRN
  Administered 2017-01-16: 100 mg via INTRAVENOUS

## 2017-01-16 MED ORDER — INSULIN ASPART 100 UNIT/ML ~~LOC~~ SOLN
0.0000 [IU] | SUBCUTANEOUS | Status: DC
  Administered 2017-01-16 (×3): 7 [IU] via SUBCUTANEOUS
  Administered 2017-01-17 (×2): 4 [IU] via SUBCUTANEOUS
  Administered 2017-01-17: 7 [IU] via SUBCUTANEOUS
  Administered 2017-01-17: 4 [IU] via SUBCUTANEOUS
  Administered 2017-01-17: 7 [IU] via SUBCUTANEOUS
  Administered 2017-01-18: 4 [IU] via SUBCUTANEOUS
  Administered 2017-01-18 (×2): 3 [IU] via SUBCUTANEOUS
  Administered 2017-01-18: 4 [IU] via SUBCUTANEOUS
  Administered 2017-01-18: 0 [IU] via SUBCUTANEOUS
  Administered 2017-01-19: 3 [IU] via SUBCUTANEOUS
  Administered 2017-01-19: 4 [IU] via SUBCUTANEOUS
  Administered 2017-01-19: 3 [IU] via SUBCUTANEOUS

## 2017-01-16 MED ORDER — FENTANYL CITRATE (PF) 100 MCG/2ML IJ SOLN
INTRAMUSCULAR | Status: DC | PRN
  Administered 2017-01-16: 50 ug via INTRAVENOUS
  Administered 2017-01-16: 100 ug via INTRAVENOUS
  Administered 2017-01-16 (×2): 50 ug via INTRAVENOUS

## 2017-01-16 MED ORDER — METOCLOPRAMIDE HCL 5 MG/ML IJ SOLN
INTRAMUSCULAR | Status: AC
  Filled 2017-01-16: qty 2

## 2017-01-16 MED ORDER — HYDROMORPHONE HCL 2 MG/ML IJ SOLN
INTRAMUSCULAR | Status: AC
  Filled 2017-01-16: qty 1

## 2017-01-16 MED ORDER — ONDANSETRON HCL 4 MG/2ML IJ SOLN
4.0000 mg | Freq: Four times a day (QID) | INTRAMUSCULAR | Status: DC | PRN
  Administered 2017-01-16: 4 mg via INTRAVENOUS
  Filled 2017-01-16: qty 2

## 2017-01-16 MED ORDER — SODIUM CHLORIDE 0.9 % IJ SOLN
INTRAMUSCULAR | Status: AC
  Filled 2017-01-16: qty 10

## 2017-01-16 MED ORDER — SODIUM CHLORIDE 0.9 % IJ SOLN
INTRAMUSCULAR | Status: DC | PRN
  Administered 2017-01-16: 10 mL

## 2017-01-16 MED ORDER — CEFOTETAN DISODIUM-DEXTROSE 2-2.08 GM-% IV SOLR
2.0000 g | INTRAVENOUS | Status: AC
  Administered 2017-01-16: 2 g via INTRAVENOUS
  Filled 2017-01-16: qty 50

## 2017-01-16 MED ORDER — BUPIVACAINE LIPOSOME 1.3 % IJ SUSP
20.0000 mL | Freq: Once | INTRAMUSCULAR | Status: AC
  Administered 2017-01-16: 20 mL
  Filled 2017-01-16: qty 20

## 2017-01-16 MED ORDER — MIDAZOLAM HCL 5 MG/5ML IJ SOLN
INTRAMUSCULAR | Status: DC | PRN
  Administered 2017-01-16: 2 mg via INTRAVENOUS

## 2017-01-16 SURGICAL SUPPLY — 44 items
APPLIER CLIP ROT 10 11.4 M/L (STAPLE)
CABLE HIGH FREQUENCY MONO STRZ (ELECTRODE) ×2 IMPLANT
CLIP APPLIE ROT 10 11.4 M/L (STAPLE) IMPLANT
COVER SURGICAL LIGHT HANDLE (MISCELLANEOUS) ×2 IMPLANT
DECANTER SPIKE VIAL GLASS SM (MISCELLANEOUS) ×2 IMPLANT
DERMABOND ADVANCED (GAUZE/BANDAGES/DRESSINGS) ×1
DERMABOND ADVANCED .7 DNX12 (GAUZE/BANDAGES/DRESSINGS) ×1 IMPLANT
DEVICE SUT QUICK LOAD TK 5 (STAPLE) ×10 IMPLANT
DEVICE SUT TI-KNOT TK 5X26 (MISCELLANEOUS) ×2 IMPLANT
DEVICE SUTURE ENDOST 10MM (ENDOMECHANICALS) ×2 IMPLANT
DISSECTOR BLUNT TIP ENDO 5MM (MISCELLANEOUS) ×2 IMPLANT
DRAIN PENROSE 18X1/2 LTX STRL (DRAIN) ×2 IMPLANT
DRAPE LAPAROSCOPIC ABDOMINAL (DRAPES) ×2 IMPLANT
ELECT REM PT RETURN 9FT ADLT (ELECTROSURGICAL) ×2
ELECTRODE REM PT RTRN 9FT ADLT (ELECTROSURGICAL) ×1 IMPLANT
GLOVE BIOGEL M 8.0 STRL (GLOVE) ×2 IMPLANT
GOWN STRL REUS W/TWL XL LVL3 (GOWN DISPOSABLE) ×6 IMPLANT
HANDLE STAPLE EGIA 4 XL (STAPLE) ×2 IMPLANT
IRRIG SUCT STRYKERFLOW 2 WTIP (MISCELLANEOUS) ×2
IRRIGATION SUCT STRKRFLW 2 WTP (MISCELLANEOUS) ×1 IMPLANT
KIT BASIN OR (CUSTOM PROCEDURE TRAY) ×2 IMPLANT
PAD POSITIONING PINK XL (MISCELLANEOUS) IMPLANT
POSITIONER SURGICAL ARM (MISCELLANEOUS) IMPLANT
RELOAD ENDO STITCH (ENDOMECHANICALS) ×6 IMPLANT
RELOAD TRI 60 ART MED THCK PUR (STAPLE) ×2 IMPLANT
SCISSORS LAP 5X45 EPIX DISP (ENDOMECHANICALS) ×2 IMPLANT
SHEARS HARMONIC ACE PLUS 45CM (MISCELLANEOUS) ×2 IMPLANT
SLEEVE ADV FIXATION 5X100MM (TROCAR) ×4 IMPLANT
STAPLER VISISTAT 35W (STAPLE) ×2 IMPLANT
SUT SURGIDAC NAB ES-9 0 48 120 (SUTURE) ×6 IMPLANT
SUT VIC AB 4-0 SH 18 (SUTURE) ×2 IMPLANT
TAPE CLOTH 4X10 WHT NS (GAUZE/BANDAGES/DRESSINGS) IMPLANT
TIP INNERVISION DETACH 40FR (MISCELLANEOUS) IMPLANT
TIP INNERVISION DETACH 50FR (MISCELLANEOUS) IMPLANT
TIP INNERVISION DETACH 56FR (MISCELLANEOUS) IMPLANT
TIPS INNERVISION DETACH 40FR (MISCELLANEOUS)
TOWEL OR 17X26 10 PK STRL BLUE (TOWEL DISPOSABLE) ×4 IMPLANT
TOWEL OR NON WOVEN STRL DISP B (DISPOSABLE) ×2 IMPLANT
TRAY FOLEY W/METER SILVER 16FR (SET/KITS/TRAYS/PACK) IMPLANT
TRAY LAPAROSCOPIC (CUSTOM PROCEDURE TRAY) ×2 IMPLANT
TROCAR ADV FIXATION 11X100MM (TROCAR) ×2 IMPLANT
TROCAR ADV FIXATION 5X100MM (TROCAR) ×2 IMPLANT
TROCAR BLADELESS OPT 5 100 (ENDOMECHANICALS) ×2 IMPLANT
TUBING INSUF HEATED (TUBING) ×2 IMPLANT

## 2017-01-16 NOTE — H&P (View-Only) (Signed)
Chief Complaint:  Dysphagia after Nissen in [place]  History of Present Illness:  [name] is an 51 y.o. female who presents for redo Nissen fundoplication.  Her original surgery was performed in [place] and she has had persistent dysphagia since then.    Past Medical History:  Diagnosis Date  . Anemia   . Anxiety   . Arthritis   . Chronic kidney disease    IGA  . Complication of anesthesia   . Depression   . Diabetes mellitus without complication (HCC)    type II   . GERD (gastroesophageal reflux disease)   . Heart murmur    since birth- no need to be concern  . History of kidney stones    hx of   . Hypertension   . PONV (postoperative nausea and vomiting)   . Shortness of breath    With exertion  . Sleep apnea    severe sleep apnea uses oxygen 2l at nite     Past Surgical History:  Procedure Laterality Date  . ABDOMINAL HYSTERECTOMY    . BREAST SURGERY Right    biospy x2  . devaited spetum surgery     . DILATION AND CURETTAGE OF UTERUS    . ESOPHAGOGASTRODUODENOSCOPY (EGD) WITH PROPOFOL N/A 12/26/2016   Procedure: ESOPHAGOGASTRODUODENOSCOPY (EGD) WITH PROPOFOL;  Surgeon: Luretha Murphy, MD;  Location: [facility];  Service: General;  Laterality: N/A;  . LUMBAR LAMINECTOMY/DECOMPRESSION MICRODISCECTOMY  10/23/2013   L4 L5   DR Shon Baton  . LUMBAR LAMINECTOMY/DECOMPRESSION MICRODISCECTOMY N/A 10/23/2013   Procedure: DECOMPRESSION AND FACET REMOVAL L4 - L5 1 LEVEL;  Surgeon: Venita Lick, MD;  Location: [facility] OR;  Service: Orthopedics;  Laterality: N/A;  . nasal repair collapse      both sides of nose and 2 plates added   . NASAL SINUS SURGERY    . NISSEN FUNDOPLICATION    . right foot surgery     . SEPTOPLASTY  2013  . SHOULDER ARTHROSCOPY WITH ROTATOR CUFF REPAIR Right    x 2  . WRIST SURGERY Left    work injury    Current Outpatient Prescriptions  Medication Sig Dispense Refill  . acetaminophen (TYLENOL) 500 MG tablet Take 1,000 mg by mouth every 6 (six) hours  as needed (pain).     Marland Kitchen albuterol (PROVENTIL HFA;VENTOLIN HFA) 108 (90 BASE) MCG/ACT inhaler Inhale 2 puffs into the lungs every 6 (six) hours as needed for wheezing or shortness of breath.    . ALPRAZolam (XANAX) 1 MG tablet Take 1 tablet (1 mg total) by mouth 3 (three) times daily. (Patient taking differently: Take 1 mg by mouth 3 (three) times daily as needed for anxiety. ) 90 tablet 2  . Cholecalciferol (VITAMIN D-3) 5000 units TABS Take 5,000 Units by mouth daily.    . DULoxetine (CYMBALTA) 60 MG capsule Take 1 capsule (60 mg total) by mouth daily. 90 capsule 2  . folic acid (FOLVITE) 1 MG tablet Take 1 mg by mouth 2 (two) times daily.    . furosemide (LASIX) 20 MG tablet Take 20 mg by mouth daily.    . metFORMIN (GLUCOPHAGE) 1000 MG tablet Take 1,000 mg by mouth 2 (two) times daily with a meal.    . metoprolol succinate (TOPROL-XL) 100 MG 24 hr tablet Take 100 mg by mouth daily. Take with or immediately following a meal.    . ondansetron (ZOFRAN) 4 MG tablet Take 4 mg by mouth every 8 (eight) hours as needed for nausea or vomiting.    Marland Kitchen  oxyCODONE-acetaminophen (PERCOCET) 10-325 MG per tablet Take 1 tablet by mouth 2 (two) times daily.     . OXYGEN Inhale 2 L into the lungs at bedtime.    . pantoprazole (PROTONIX) 40 MG tablet Take 80 mg by mouth daily.     . potassium chloride SA (K-DUR,KLOR-CON) 20 MEQ tablet Take 20 mEq by mouth 3 (three) times daily.    . ranitidine (ZANTAC) 300 MG tablet Take 300 mg by mouth at bedtime.    Marland Kitchen. spironolactone-hydrochlorothiazide (ALDACTAZIDE) 25-25 MG tablet Take 1 tablet by mouth daily.    . vitamin B-12 (CYANOCOBALAMIN) 1000 MCG tablet Take 1,000 mcg by mouth daily.    Marland Kitchen. zolpidem (AMBIEN) 10 MG tablet Take 1 tablet (10 mg total) by mouth at bedtime. 30 tablet 2   No current facility-administered medications for this visit.    Adhesive [tape]; Codeine; Macrobid [nitrofurantoin monohyd macro]; and Neosporin [neomycin-bacitracin zn-polymyx] Family History   Problem Relation Age of Onset  . Alcohol abuse Brother   . Drug abuse Other    Social History:   reports that she has never smoked. She has never used smokeless tobacco. She reports that she does not drink alcohol or use drugs.   REVIEW OF SYSTEMS : Negative except for see problem list  Physical Exam:   There were no vitals taken for this visit. There is no height or weight on file to calculate BMI.  Gen:  WDWN WF NAD  Neurological: Alert and oriented to person, place, and time. Motor and sensory function is grossly intact  Head: Normocephalic and atraumatic.  Eyes: Conjunctivae are normal. Pupils are equal, round, and reactive to light. No scleral icterus.  Neck: Normal range of motion. Neck supple. No tracheal deviation or thyromegaly present.  Cardiovascular:  SR without murmurs or gallops.  No carotid bruits Breast:  Not examined Respiratory: Effort normal.  No respiratory distress. No chest wall tenderness. Breath sounds normal.  No wheezes, rales or rhonchi.  Abdomen:  nontender with well healed incisions GU:  Not examined Musculoskeletal: Normal range of motion. Extremities are nontender. No cyanosis, edema or clubbing noted Lymphadenopathy: No cervical, preauricular, postauricular or axillary adenopathy is present Skin: Skin is warm and dry. No rash noted. No diaphoresis. No erythema. No pallor. Pscyh: Normal mood and affect. Behavior is normal. Judgment and thought content normal.   LABORATORY RESULTS: No results found for this or any previous visit (from the past 48 hour(s)).   RADIOLOGY RESULTS: No results found.  Problem List: Patient Active Problem List   Diagnosis Date Noted  . Major depression 03/31/2015  . Back pain 10/23/2013    Assessment & Plan: Dysphagia post nissen.  Suspect torqueing of the wrap with functional obstruction.   Plan laparoscopic revision    Matt B. Daphine DeutscherMartin, MD, [facility]  [facility] [phone]  beeper [phone]  01/13/2017 11:26 AM

## 2017-01-16 NOTE — Brief Op Note (Signed)
01/16/2017  1:35 PM  PATIENT:  Marcelino ScotPenny L Faw  51 y.o. female  PRE-OPERATIVE DIAGNOSIS:  gastroesophageal reflux disease  POST-OPERATIVE DIAGNOSIS:  gastroesophageal reflux disease  PROCEDURE:  Procedure(s): LAPAROSCOPIC ENTEROLYSIS TAKEDOWN OF PRIOR NISSEN FUNDOPLICATION WITH UPPER ENDOSCOPY (N/A)  SURGEON:  Surgeon(s) and Role:    * Luretha MurphyMatthew Kanija Remmel, MD - Primary    * Gaynelle AduEric Wilson, MD - Assisting  PHYSICIAN ASSISTANT:   ASSISTANTS: Gaynelle AduEric Wilson, MD,FACS   ANESTHESIA:   general  EBL:  Total I/O In: 1000 [I.V.:1000] Out: 100 [Urine:50; Blood:50]  BLOOD ADMINISTERED:none  DRAINS: none   LOCAL MEDICATIONS USED:  BUPIVICAINE   SPECIMEN:  No Specimen  DISPOSITION OF SPECIMEN:  N/A  COUNTS:  YES  TOURNIQUET:  * No tourniquets in log *  DICTATION: .Other Dictation: Dictation Number [id number]  PLAN OF CARE: Admit for overnight observation  PATIENT DISPOSITION:  PACU - hemodynamically stable.   Delay start of Pharmacological VTE agent (>24hrs) due to surgical blood loss or risk of bleeding: yes

## 2017-01-16 NOTE — Anesthesia Preprocedure Evaluation (Addendum)
Anesthesia Evaluation  Patient identified by MRN, date of birth, ID band Patient awake    Reviewed: Allergy & Precautions, NPO status , Patient's Chart, lab work & pertinent test results  History of Anesthesia Complications (+) PONV  Airway Mallampati: II  TM Distance: >3 FB     Dental   Pulmonary shortness of breath, sleep apnea ,    breath sounds clear to auscultation       Cardiovascular hypertension, + Valvular Problems/Murmurs  Rhythm:Regular Rate:Normal     Neuro/Psych    GI/Hepatic Neg liver ROS, GERD  ,  Endo/Other  diabetes  Renal/GU Renal disease     Musculoskeletal   Abdominal   Peds  Hematology  (+) anemia ,   Anesthesia Other Findings   Reproductive/Obstetrics                             Anesthesia Physical Anesthesia Plan  ASA: III  Anesthesia Plan: General   Post-op Pain Management:    Induction: Intravenous  Airway Management Planned: Oral ETT  Additional Equipment:   Intra-op Plan:   Post-operative Plan: Possible Post-op intubation/ventilation  Informed Consent: I have reviewed the patients History and Physical, chart, labs and discussed the procedure including the risks, benefits and alternatives for the proposed anesthesia with the patient or authorized representative who has indicated his/her understanding and acceptance.   Dental advisory given  Plan Discussed with: CRNA and Anesthesiologist  Anesthesia Plan Comments:         Anesthesia Quick Evaluation

## 2017-01-16 NOTE — Anesthesia Procedure Notes (Signed)
Procedure Name: Intubation Date/Time: 01/16/2017 10:25 AM Performed by: Noralyn Pick D Pre-anesthesia Checklist: Patient identified, Emergency Drugs available, Suction available and Patient being monitored Patient Re-evaluated:Patient Re-evaluated prior to inductionOxygen Delivery Method: Circle system utilized Preoxygenation: Pre-oxygenation with 100% oxygen Intubation Type: IV induction, Rapid sequence and Cricoid Pressure applied Laryngoscope Size: Mac and 3 Grade View: Grade I Tube type: Oral Tube size: 7.5 mm Number of attempts: 1 Airway Equipment and Method: Stylet Placement Confirmation: ETT inserted through vocal cords under direct vision,  positive ETCO2 and breath sounds checked- equal and bilateral Secured at: 22 cm Tube secured with: Tape Dental Injury: Teeth and Oropharynx as per pre-operative assessment

## 2017-01-16 NOTE — Anesthesia Postprocedure Evaluation (Signed)
Anesthesia Post Note  Patient: [name]  Procedure(s) Performed: Procedure(s) (LRB): LAPAROSCOPIC ENTEROLYSIS TAKEDOWN OF PRIOR NISSEN FUNDOPLICATION WITH UPPER ENDOSCOPY (N/A)  Patient location during evaluation: PACU Anesthesia Type: General Level of consciousness: awake Pain management: pain level controlled Vital Signs Assessment: post-procedure vital signs reviewed and stable Respiratory status: spontaneous breathing Cardiovascular status: stable Anesthetic complications: no       Last Vitals:  Vitals:   01/16/17 1415 01/16/17 1422  BP: (!) 161/96   Pulse: 74 74  Resp: 11 11  Temp:      Last Pain:  Vitals:   01/16/17 0857  TempSrc:   PainSc: 8                  Cortavius Montesinos

## 2017-01-16 NOTE — Transfer of Care (Signed)
Immediate Anesthesia Transfer of Care Note  Patient: [name]  Procedure(s) Performed: Procedure(s): LAPAROSCOPIC ENTEROLYSIS TAKEDOWN OF PRIOR NISSEN FUNDOPLICATION WITH UPPER ENDOSCOPY (N/A)  Patient Location: PACU  Anesthesia Type:General  Level of Consciousness: awake, alert  and oriented  Airway & Oxygen Therapy: Patient Spontanous Breathing and Patient connected to face mask oxygen  Post-op Assessment: Report given to RN and Post -op Vital signs reviewed and stable  Post vital signs: Reviewed and stable  Last Vitals:  Vitals:   01/16/17 0843  BP: (!) 149/87  Pulse: 79  Resp: 18  Temp: 36.6 C    Last Pain:  Vitals:   01/16/17 0857  TempSrc:   PainSc: 8       Patients Stated Pain Goal: 3 (01/16/17 0857)  Complications: No apparent anesthesia complications

## 2017-01-16 NOTE — Op Note (Signed)
[name] [id number] 10-Jan-1966 01/16/2017  Preoperative diagnosis: h/o Laparoscopic Nissen fundoplication, dysphagia  Postoperative diagnosis: Same   Procedure: upper endoscopy   Surgeon: Mary SellaEric M. Glori Machnik M.D., FACS   Anesthesia: Gen.   Indications for procedure: 51 y.o. year old female undergoing Laparoscopic Revision of nissen fundoplication and an EGD was requested to evaluate the stomach after take down of the prior wrap.   Description of procedure: After we have completed the take down of the prior nissen fundoplication and closed the gastrostomy, I scrubbed out and obtained the Olympus endoscope. I gently placed endoscope in the patient's oropharynx and gently glided it down the esophagus without any difficulty under direct visualization. Her Z line was irregular consistent with reflux. Once I was in the gastric body, I insufflated the stomach with air. I was able to advance the scope through the stomach. I was able to cannulate the duodenum with ease. The staple line in the fundus was visible and intact. No evidence of defect to the esophagus or stomach.   There was no evidence of bleeding. The gastric body was decompressed. The scope was withdrawn. The patient tolerated this portion of the procedure well. Please see Dr Ermalene Searingmartin's operative note for details regarding the remainder of the procedure  Mary Sellaric M. Andrey CampanileWilson, MD, FACS  General, Bariatric, & Minimally Invasive Surgery  [facility] [place]

## 2017-01-16 NOTE — Progress Notes (Signed)
Nasal swab done by Bernette RedbirdMichelle Herrschaft, R.N.  To check for MRSA and Staph.

## 2017-01-16 NOTE — Interval H&P Note (Signed)
History and Physical Interval Note:  01/16/2017 10:11 AM  [name]  has presented today for surgery, with the diagnosis of gastroesophageal reflux disease  The various methods of treatment have been discussed with the patient and family. After consideration of risks, benefits and other options for treatment, the patient has consented to  Procedure(s): LAPAROSCOPIC REDO NISSEN FUNDOPLICATION (N/A) as a surgical intervention .  The patient's history has been reviewed, patient examined, no change in status, stable for surgery.  I have reviewed the patient's chart and labs.  Questions were answered to the patient's satisfaction.     Sheena Donegan B

## 2017-01-16 NOTE — Progress Notes (Signed)
Dr. Chilton SiGreen made aware of patient's CBG results in PACU-192
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
## 2017-01-17 LAB — GLUCOSE, CAPILLARY
GLUCOSE-CAPILLARY: 191 mg/dL — AB (ref 65–99)
GLUCOSE-CAPILLARY: 155 mg/dL — AB (ref 65–99)
Glucose-Capillary: 194 mg/dL — ABNORMAL HIGH (ref 65–99)
Glucose-Capillary: 200 mg/dL — ABNORMAL HIGH (ref 65–99)
Glucose-Capillary: 213 mg/dL — ABNORMAL HIGH (ref 65–99)

## 2017-01-17 NOTE — Progress Notes (Signed)
Initial Nutrition Assessment  DOCUMENTATION CODES:   Obesity unspecified  INTERVENTION:   Continue Boost Breeze po TID, each supplement provides 250 kcal and 9 grams of protein Encourage PO intake RD to continue to monitor  NUTRITION DIAGNOSIS:   Inadequate oral intake related to poor appetite, nausea, vomiting as evidenced by meal completion < 50%.  GOAL:   Patient will meet greater than or equal to 90% of their needs  MONITOR:   PO intake, Supplement acceptance, Diet advancement, Labs, Weight trends, Skin, I & O's  REASON FOR ASSESSMENT:   Malnutrition Screening Tool    ASSESSMENT:   51 y.o. year old female undergoing Laparoscopic Revision of nissen fundoplication and an EGD was requested to evaluate the stomach after take down of the prior wrap.  3/5: s/p Laparoscopic takedown of Nissen fundoplication and resection of perforated portion of the wrap  Patient in room with no family at bedside. Pt reports some pain with drinking liquids. Pt denies any nausea at this time. Pt states she did not have anything yesterday. This morning she did not eat any breakfast but did consume 1 Boost Breeze. For lunch she had a bowl of jello, ~50% of her broth and a few sips of tea. Encouraged pt to continue to drink [employer]reeze as they are her primary source of protein to help with healing.  Pt reports UBW is 280 lb (prior to initial nissen fundoplication surgery 1.5 years ago). Pt with 37 lb weight loss x 1.5 years which is insignificant for time frame. Nutrition focused physical exam shows no sign of depletion of muscle mass or body fat.  Medication: IV Protonix daily, D5 and .45% NaCl w/ KCl infusion at 125 ml/hr -provides 510 kcal, IV Zofran PRN Labs reviewed: CBGs: 191-200  Diet Order:  Diet clear liquid Room service appropriate? Yes; Fluid consistency: Thin  Skin:  Wound (see comment) (3/5 abdominal incision)  Last BM:  3/4  Height:   Ht Readings from Last 1 Encounters:   01/16/17 5\' 6"  (1.676 m)    Weight:   Wt Readings from Last 1 Encounters:  01/16/17 243 lb (110.2 kg)    Ideal Body Weight:  59.1 kg  BMI:  Body mass index is 39.22 kg/m.  Estimated Nutritional Needs:   Kcal:  1800-2000  Protein:  70-80g  Fluid:  2L/day  EDUCATION NEEDS:   No education needs identified at this time  Tilda FrancoLindsey Pharoah Goggins, MS, RD, LDN Pager: [phone] After Hours Pager: [phone]

## 2017-01-17 NOTE — Progress Notes (Signed)
Failed voiding trial,bladder scanner shows 750 cc of urine.Performed In and out catheterization patient tolerated and educated for another voiding trial. We will continue to monitor and assess  The patient.

## 2017-01-17 NOTE — Care Management Obs Status (Signed)
MEDICARE OBSERVATION STATUS NOTIFICATION   Patient Details  Name: [name] MRN: [id number] Date of Birth: 08-20-1966   Medicare Observation Status Notification Given:  Yes    Alexis Goodelleele, Zymarion Favorite K, RN 01/17/2017, 10:08 AM

## 2017-01-17 NOTE — Op Note (Signed)
NAMAlbesa Seen:  [name], [name]                 ACCOUNT NO.:  [id number]  MEDICAL RECORD NO.:  [id number]  LOCATION:                                 FACILITY:  PHYSICIAN:  Thornton ParkMatthew B. Daphine DeutscherMartin, MD  DATE OF BIRTH:  1966-01-16  DATE OF PROCEDURE: DATE OF DISCHARGE:                              OPERATIVE REPORT   PREOPERATIVE DIAGNOSIS:  Prior Nissen fundoplication a year and a half ago [place] dysph[place].  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  PROCEDURE:  Laparoscopic takedown of Nissen fundoplication and resection of perforated portion of the wrap, upper endoscopy by Dr. Andrey CampanileWilson.  SURGEON:  Thornton ParkMatthew B. Daphine DeutscherMartin, MD  ASSISTANT:  Gaynelle AduEric Wilson.  ANESTHESIA:  General endotracheal.  DESCRIPTION OF PROCEDURE:  The patient was taken to room 1 and given general anesthesia.  The abdomen was entered through the left upper quadrant with a 5-mm Optiview without difficulty.  Abdomen was insufflated.  The lower portion of the abdomen did not have any problems with adhesions.  I put the [facility] retractor in the upper midline, used a standard 6 port including 12 just to the right of the umbilicus. I took down the area where the wrap appeared to be really much lower involving stomach to stomach and a suture and also there were lateral adhesions to the stomach.  These were taken down sharply.  Eventually after 2.5 hours of dissection, I was able to free the foregut.  I had taken down the actual plication, which appeared to be fairly eccentrically located with a wrap.  I had read __________ note and the previous surgeon had gotten into some bleeding posteriorly where he thought was in the spleen.  We encountered a little bleeding in that area and controlled that with electrocautery.  In the course of manipulating the portion of the stomach that had been wrapped  around, I noted an enterotomy and just felt like that was necessary and it required the traction and countertraction to enable me to dissect where this was ferociously stuck to the diaphragmatic closure which was probably done with the pledgets because the stomach was pretty well fused to that.  I got this all taken down posteriorly and then went ahead and elevated out on the left side, I went ahead and stapled off that little spot using an Endo-GIA with a purple load 6-cm with TRS. There was no hiatal hernia that we noted.  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  I elected because her main problem had been dysphagia or difficulty swallowing that I would not rewrap her because I felt like that would likely re-create dysphagia. Also, the only dilators available are 50-French bougie and the other ones are out of stock.  All the port sites were injected with Exparel.  The abdomen was deflated, and prior to removing the camera, we looked at the gallbladder again, and  we had studied it before and after, it was a nice Robin egg blue with some fatty infiltration medially, but there was no evidence of any acute or subacute or chronic cholecystitis.  The patient seemed to tolerate the procedure well, was taken to recovery room in satisfactory condition.     Thornton Park Daphine Deutscher, MD   ______________________________ Thornton Park Daphine Deutscher, MD    MBM/MEDQ  D:  01/16/2017  T:  01/16/2017  Job:  [id number]

## 2017-01-18 ENCOUNTER — Observation Stay (HOSPITAL_COMMUNITY): Payer: Medicare HMO

## 2017-01-18 DIAGNOSIS — Z9981 Dependence on supplemental oxygen: Secondary | ICD-10-CM | POA: Diagnosis not present

## 2017-01-18 DIAGNOSIS — Z885 Allergy status to narcotic agent status: Secondary | ICD-10-CM | POA: Diagnosis not present

## 2017-01-18 DIAGNOSIS — Z881 Allergy status to other antibiotic agents status: Secondary | ICD-10-CM | POA: Diagnosis not present

## 2017-01-18 DIAGNOSIS — K219 Gastro-esophageal reflux disease without esophagitis: Secondary | ICD-10-CM | POA: Diagnosis present

## 2017-01-18 DIAGNOSIS — K2270 Barrett's esophagus without dysplasia: Secondary | ICD-10-CM | POA: Diagnosis present

## 2017-01-18 DIAGNOSIS — Z7984 Long term (current) use of oral hypoglycemic drugs: Secondary | ICD-10-CM | POA: Diagnosis not present

## 2017-01-18 DIAGNOSIS — F329 Major depressive disorder, single episode, unspecified: Secondary | ICD-10-CM | POA: Diagnosis present

## 2017-01-18 DIAGNOSIS — Z91048 Other nonmedicinal substance allergy status: Secondary | ICD-10-CM | POA: Diagnosis not present

## 2017-01-18 DIAGNOSIS — E119 Type 2 diabetes mellitus without complications: Secondary | ICD-10-CM | POA: Diagnosis present

## 2017-01-18 DIAGNOSIS — T8189XA Other complications of procedures, not elsewhere classified, initial encounter: Secondary | ICD-10-CM | POA: Diagnosis present

## 2017-01-18 DIAGNOSIS — Y838 Other surgical procedures as the cause of abnormal reaction of the patient, or of later complication, without mention of misadventure at the time of the procedure: Secondary | ICD-10-CM | POA: Diagnosis present

## 2017-01-18 DIAGNOSIS — I10 Essential (primary) hypertension: Secondary | ICD-10-CM | POA: Diagnosis present

## 2017-01-18 DIAGNOSIS — G4730 Sleep apnea, unspecified: Secondary | ICD-10-CM | POA: Diagnosis present

## 2017-01-18 DIAGNOSIS — R1310 Dysphagia, unspecified: Secondary | ICD-10-CM | POA: Diagnosis present

## 2017-01-18 LAB — BASIC METABOLIC PANEL
ANION GAP: 6 (ref 5–15)
BUN: 6 mg/dL (ref 6–20)
CALCIUM: 8.7 mg/dL — AB (ref 8.9–10.3)
CO2: 28 mmol/L (ref 22–32)
CREATININE: 0.92 mg/dL (ref 0.44–1.00)
Chloride: 108 mmol/L (ref 101–111)
GLUCOSE: 153 mg/dL — AB (ref 65–99)
Potassium: 3.9 mmol/L (ref 3.5–5.1)
Sodium: 142 mmol/L (ref 135–145)

## 2017-01-18 LAB — CBC WITH DIFFERENTIAL/PLATELET
BASOS PCT: 0 %
Basophils Absolute: 0 10*3/uL (ref 0.0–0.1)
EOS PCT: 1 %
Eosinophils Absolute: 0.1 10*3/uL (ref 0.0–0.7)
HEMATOCRIT: 36.7 % (ref 36.0–46.0)
Hemoglobin: 11.3 g/dL — ABNORMAL LOW (ref 12.0–15.0)
Lymphocytes Relative: 32 %
Lymphs Abs: 3.4 10*3/uL (ref 0.7–4.0)
MCH: 24.2 pg — ABNORMAL LOW (ref 26.0–34.0)
MCHC: 30.8 g/dL (ref 30.0–36.0)
MCV: 78.8 fL (ref 78.0–100.0)
MONO ABS: 0.8 10*3/uL (ref 0.1–1.0)
MONOS PCT: 7 %
NEUTROS ABS: 6.4 10*3/uL (ref 1.7–7.7)
Neutrophils Relative %: 60 %
PLATELETS: 352 10*3/uL (ref 150–400)
RBC: 4.66 MIL/uL (ref 3.87–5.11)
RDW: 15.9 % — AB (ref 11.5–15.5)
WBC: 10.7 10*3/uL — ABNORMAL HIGH (ref 4.0–10.5)

## 2017-01-18 LAB — URINALYSIS, ROUTINE W REFLEX MICROSCOPIC
Bilirubin Urine: NEGATIVE
GLUCOSE, UA: NEGATIVE mg/dL
HGB URINE DIPSTICK: NEGATIVE
Ketones, ur: NEGATIVE mg/dL
Leukocytes, UA: NEGATIVE
Nitrite: NEGATIVE
PH: 8 (ref 5.0–8.0)
Protein, ur: NEGATIVE mg/dL
Specific Gravity, Urine: 1.009 (ref 1.005–1.030)

## 2017-01-18 LAB — GLUCOSE, CAPILLARY
GLUCOSE-CAPILLARY: 111 mg/dL — AB (ref 65–99)
GLUCOSE-CAPILLARY: 138 mg/dL — AB (ref 65–99)
GLUCOSE-CAPILLARY: 153 mg/dL — AB (ref 65–99)
GLUCOSE-CAPILLARY: 157 mg/dL — AB (ref 65–99)
Glucose-Capillary: 123 mg/dL — ABNORMAL HIGH (ref 65–99)
Glucose-Capillary: 164 mg/dL — ABNORMAL HIGH (ref 65–99)
Glucose-Capillary: 141 mg/dL — ABNORMAL HIGH (ref 65–99)
Glucose-Capillary: 156 mg/dL — ABNORMAL HIGH (ref 65–99)
Glucose-Capillary: 184 mg/dL — ABNORMAL HIGH (ref 65–99)

## 2017-01-18 MED ORDER — MUPIROCIN 2 % EX OINT
TOPICAL_OINTMENT | CUTANEOUS | Status: AC
  Filled 2017-01-18: qty 22

## 2017-01-18 MED ORDER — DULOXETINE HCL 60 MG PO CPEP
60.0000 mg | ORAL_CAPSULE | Freq: Every day | ORAL | Status: DC
  Administered 2017-01-18 – 2017-01-19 (×2): 60 mg via ORAL
  Filled 2017-01-18 (×2): qty 1

## 2017-01-18 MED ORDER — METOPROLOL SUCCINATE ER 50 MG PO TB24
100.0000 mg | ORAL_TABLET | Freq: Every day | ORAL | Status: DC
  Filled 2017-01-18 (×2): qty 2

## 2017-01-18 MED ORDER — MUPIROCIN 2 % EX OINT
1.0000 "application " | TOPICAL_OINTMENT | Freq: Two times a day (BID) | CUTANEOUS | Status: DC
  Administered 2017-01-18 – 2017-01-19 (×3): 1 via NASAL

## 2017-01-18 MED ORDER — ALPRAZOLAM 1 MG PO TABS
1.0000 mg | ORAL_TABLET | Freq: Three times a day (TID) | ORAL | Status: DC
  Administered 2017-01-18 – 2017-01-19 (×4): 1 mg via ORAL
  Filled 2017-01-18 (×3): qty 2
  Filled 2017-01-18: qty 1
  Filled 2017-01-18: qty 2

## 2017-01-18 MED ORDER — CHLORHEXIDINE GLUCONATE CLOTH 2 % EX PADS: 6.0000 | MEDICATED_PAD | Freq: Every day | CUTANEOUS | Status: DC

## 2017-01-18 NOTE — Progress Notes (Signed)
CBG's are off schedule because  the 12:00 p.m. cbg was not covered.  It was checked again at 1440 and covered with 3 units.  It was not checked again until 1857 (156) and 4 units were given.  I had the tech check it at 2000 to try to get it back on schedule but the night shift nurse did not need to provide coverage because she was covered at 1900.

## 2017-01-18 NOTE — Progress Notes (Signed)
Patient ID: [name], female   DOB: 12-12-1965, 51 y.o.   MRN: [id number] [facility] Progress Note:   2 Days Post-Op  Subjective: Mental status is clear but anxious;  Awoke with panic  Objective: Vital signs in last 24 hours: Temp:  [97.9 F (36.6 C)-98.2 F (36.8 C)] 98.2 F (36.8 C) (03/07 7622) Pulse Rate:  [53-75] 73 (03/07 0750) Resp:  [15-16] 16 (03/07 0608) BP: (111-164)/(68-98) 157/82 (03/07 0750) SpO2:  [94 %-100 %] 100 % (03/07 0700)  Intake/Output from previous day: 03/06 0701 - 03/07 0700 In: 2840 [P.O.:840; I.V.:2000] Out: 3300 [Urine:3300] Intake/Output this shift: Total I/O In: -  Out: 1000 [Urine:1000]  Physical Exam: Work of breathing is not labored.  Rapid response here.  Face and cheeks are flush;  Incisions OK.  Abdomen is nontender (BS + per nursing).    Lab Results:  Results for orders placed or performed during the hospital encounter of 01/16/17 (from the past 48 hour(s))  Glucose, capillary     Status: Abnormal   Collection Time: 01/16/17  1:55 PM  Result Value Ref Range   Glucose-Capillary 192 (H) 65 - 99 mg/dL   Comment 1 Notify RN    Comment 2 Document in Chart    Comment 3 Call MD NNP PA CNM   Surgical pcr screen     Status: Abnormal   Collection Time: 01/16/17  2:00 PM  Result Value Ref Range   MRSA, PCR POSITIVE (A) NEGATIVE    Comment: RESULT CALLED TO, READ BACK BY AND VERIFIED WITH: C.MCLEAN RN AT 1700 01/16/17 BY N.THOMPSON    Staphylococcus aureus POSITIVE (A) NEGATIVE    Comment:        The Xpert SA Assay (FDA approved for NASAL specimens in patients over 19 years of age), is one component of a comprehensive surveillance program.  Test performance has been validated by [facility] for patients greater than or equal to 37 year old. It is not intended to diagnose infection nor to guide or monitor treatment.   CBC     Status: Abnormal   Collection Time: 01/16/17  4:20 PM  Result Value Ref Range   WBC 19.9 (H)  4.0 - 10.5 K/uL   RBC 5.02 3.87 - 5.11 MIL/uL   Hemoglobin 12.3 12.0 - 15.0 g/dL   HCT 37.5 36.0 - 46.0 %   MCV 74.7 (L) 78.0 - 100.0 fL   MCH 24.5 (L) 26.0 - 34.0 pg   MCHC 32.8 30.0 - 36.0 g/dL   RDW 14.9 11.5 - 15.5 %   Platelets 411 (H) 150 - 400 K/uL  Creatinine, serum     Status: None   Collection Time: 01/16/17  4:20 PM  Result Value Ref Range   Creatinine, Ser 0.96 0.44 - 1.00 mg/dL   GFR calc non Af Amer >60 >60 mL/min   GFR calc Af Amer >60 >60 mL/min    Comment: (NOTE) The eGFR has been calculated using the CKD EPI equation. This calculation has not been validated in all clinical situations. eGFR's persistently <60 mL/min signify possible Chronic Kidney Disease.   Glucose, capillary     Status: Abnormal   Collection Time: 01/16/17  5:29 PM  Result Value Ref Range   Glucose-Capillary 228 (H) 65 - 99 mg/dL  Glucose, capillary     Status: Abnormal   Collection Time: 01/16/17  8:24 PM  Result Value Ref Range   Glucose-Capillary 220 (H) 65 - 99 mg/dL  Glucose, capillary  Status: Abnormal   Collection Time: 01/16/17 11:40 PM  Result Value Ref Range   Glucose-Capillary 214 (H) 65 - 99 mg/dL  Glucose, capillary     Status: Abnormal   Collection Time: 01/17/17  4:11 AM  Result Value Ref Range   Glucose-Capillary 194 (H) 65 - 99 mg/dL  Glucose, capillary     Status: Abnormal   Collection Time: 01/17/17  7:30 AM  Result Value Ref Range   Glucose-Capillary 191 (H) 65 - 99 mg/dL  Glucose, capillary     Status: Abnormal   Collection Time: 01/17/17 11:37 AM  Result Value Ref Range   Glucose-Capillary 200 (H) 65 - 99 mg/dL  Glucose, capillary     Status: Abnormal   Collection Time: 01/17/17  4:50 PM  Result Value Ref Range   Glucose-Capillary 155 (H) 65 - 99 mg/dL  Glucose, capillary     Status: Abnormal   Collection Time: 01/17/17  8:05 PM  Result Value Ref Range   Glucose-Capillary 213 (H) 65 - 99 mg/dL  Glucose, capillary     Status: Abnormal   Collection Time:  01/18/17 12:07 AM  Result Value Ref Range   Glucose-Capillary 157 (H) 65 - 99 mg/dL  Glucose, capillary     Status: Abnormal   Collection Time: 01/18/17  4:15 AM  Result Value Ref Range   Glucose-Capillary 111 (H) 65 - 99 mg/dL  Glucose, capillary     Status: Abnormal   Collection Time: 01/18/17  6:08 AM  Result Value Ref Range   Glucose-Capillary 138 (H) 65 - 99 mg/dL  Glucose, capillary     Status: Abnormal   Collection Time: 01/18/17  7:47 AM  Result Value Ref Range   Glucose-Capillary 123 (H) 65 - 99 mg/dL  CBC with Differential/Platelet     Status: Abnormal   Collection Time: 01/18/17  8:06 AM  Result Value Ref Range   WBC 10.7 (H) 4.0 - 10.5 K/uL   RBC 4.66 3.87 - 5.11 MIL/uL   Hemoglobin 11.3 (L) 12.0 - 15.0 g/dL   HCT 36.7 36.0 - 46.0 %   MCV 78.8 78.0 - 100.0 fL   MCH 24.2 (L) 26.0 - 34.0 pg   MCHC 30.8 30.0 - 36.0 g/dL   RDW 15.9 (H) 11.5 - 15.5 %   Platelets 352 150 - 400 K/uL   Neutrophils Relative % 60 %   Neutro Abs 6.4 1.7 - 7.7 K/uL   Lymphocytes Relative 32 %   Lymphs Abs 3.4 0.7 - 4.0 K/uL   Monocytes Relative 7 %   Monocytes Absolute 0.8 0.1 - 1.0 K/uL   Eosinophils Relative 1 %   Eosinophils Absolute 0.1 0.0 - 0.7 K/uL   Basophils Relative 0 %   Basophils Absolute 0.0 0.0 - 0.1 K/uL  Basic metabolic panel     Status: Abnormal   Collection Time: 01/18/17  8:06 AM  Result Value Ref Range   Sodium 142 135 - 145 mmol/L   Potassium 3.9 3.5 - 5.1 mmol/L   Chloride 108 101 - 111 mmol/L   CO2 28 22 - 32 mmol/L   Glucose, Bld 153 (H) 65 - 99 mg/dL   BUN 6 6 - 20 mg/dL   Creatinine, Ser 0.92 0.44 - 1.00 mg/dL   Calcium 8.7 (L) 8.9 - 10.3 mg/dL   GFR calc non Af Amer >60 >60 mL/min   GFR calc Af Amer >60 >60 mL/min    Comment: (NOTE) The eGFR has been calculated using the CKD EPI  equation. This calculation has not been validated in all clinical situations. eGFR's persistently <60 mL/min signify possible Chronic Kidney Disease.    Anion gap 6 5 - 15     Radiology/Results: No results found.  Anti-infectives: Anti-infectives    Start     Dose/Rate Route Frequency Ordered Stop   01/16/17 0837  cefoTEtan in Dextrose 5% (CEFOTAN) IVPB 2 g     2 g Intravenous On call to O.R. 01/16/17 0837 01/16/17 1027      Assessment/Plan: Problem List: Patient Active Problem List   Diagnosis Date Noted  . Dysphagia 01/16/2017  . Major depression 03/31/2015  . Back pain 10/23/2013    WBC OK.  Will restart xanax and cymbalta.  Check CXR.  Not ready for discharge.  2 Days Post-Op    LOS: 0 days   Matt B. Hassell Done, MD, [facility] [phone] beeper [phone]  01/18/2017 8:50 AM

## 2017-01-18 NOTE — Progress Notes (Signed)
Rapid Response Event Note  Overview: Received a RRT call at 0740 this morning. Arrived in room at 0730. Found patient to be anxious and flushed. She was hooked to her dinamap on the unit and I got her hooked to our dinamap and began to monitor her vitals. Patient stated that she was not in pain but that she just "did not feel right" and it felt like there was something crawling on her skin. Remained with patient 1 hour and spoke face-to-face with patient's surgeon, Dr. Daphine DeutscherMartin.     Initial Focused Assessment: Patient found to to be anxious, flushed, alert and oriented. HR was 60-70's, RR 15-20, oxygen saturation 99-100% on 2L [place], 94-97% on room air. Patient was hypertensive with BPs running 150s-170s/70s-90s. Patient denied pain on arrival to the room however after she [facility] her pain jumped to 8-9/10. RN, Terri, administered 12.5 of Fentanyl.  MD came to room and assessed the patient and he and I were able to speak in the hall. He asked if she needed to come to the unit. I said not at this time and he agreed. Upon further evaluation of her home medications he let me know that he was going to restart her home anxiety medications which included Cymbalta and Xanax. Patient was accustomed to taking Xanax TID and had not had any since Sunday. Spoke with Terri, RN and told her to give the patient her Xanax as soon as it was approved with the hope with would make her feel better.  Interventions: Home medications were ordered my MD. He felt this would make a difference as she is a very anxious individual. Information was passed on to patient's nurse, Terri.  Plan of Care (if not transferred): Give home medications and continue to monitor.  Event Summary: Arrived on the unit at 0745,remained with patient until MD arrived, spoke with him about patient status, discussed information with patient's nurse, RRT end time was 0845.      Lorelee Mclaurin B

## 2017-01-18 NOTE — Progress Notes (Signed)
Called MD concerning pulse of 59 and the ok was given to hold the metoprolol.

## 2017-01-18 NOTE — Progress Notes (Signed)
Pt complaining of burning while urinating and a frequency of having to void.  Called MD and orders were given for a urinanalysis and culture.

## 2017-01-18 NOTE — Progress Notes (Signed)
During bedside reporting this morning, pt stated she did not "feel right". She had complained earlier in shift of feeling sweaty. No pain, no trouble breathing, BP was elevated. Said she felt funny in her abdomen, like it was "squishy" and at one point her heart felt like it "took off".  Also said her hands and legs felt twitchy. Called MD and orders were given also called for support from rapid response nurse.  Will continue to monitor.

## 2017-01-19 ENCOUNTER — Ambulatory Visit (HOSPITAL_COMMUNITY): Payer: Self-pay | Admitting: Psychiatry

## 2017-01-19 LAB — GLUCOSE, CAPILLARY
Glucose-Capillary: 146 mg/dL — ABNORMAL HIGH (ref 65–99)
Glucose-Capillary: 122 mg/dL — ABNORMAL HIGH (ref 65–99)

## 2017-01-19 NOTE — Progress Notes (Signed)
I gave 12.5 of Fentanyl  to patient before she was discharged.  I was unable to waste in pyxis after she was discharged.  I wasted the remainder 87.5 mcg of Fentanyl in the sharps container and it was witnessed by Quenton FetterMisha Mathis, RN.

## 2017-01-19 NOTE — Discharge Instructions (Signed)
Bariatric Surgery Information Bariatric surgery, also called weight loss surgery, is a procedure that helps you lose weight. You may consider or your health care provider may suggest bariatric surgery if:  You are severely obese and have been unable to lose weight through diet and exercise.  You have health problems related to obesity, such as: ? Type 2 diabetes. ? Heart disease. ? Lung disease.  How does bariatric surgery help me lose weight? Bariatric surgery helps you lose weight by decreasing how much food your body absorbs. This is done by closing off part of your stomach to make it smaller. This restricts the amount of food your stomach can hold. Bariatric surgery can also change your body's regular digestive process, so that food bypasses the parts of your body that absorb calories and nutrients. If you decide to have bariatric surgery, it is important to continue to eat a healthy diet and exercise regularly after the surgery. What are the different kinds of bariatric surgery? There are two kinds of bariatric surgeries:  Restrictive surgeries make your stomach smaller. They do not change your digestive process. The smaller the size of your new stomach, the less food you can eat. There are different types of restrictive surgeries.  Malabsorptive surgeries both make your stomach smaller and alter your digestive process so that your body processes less calories and nutrients. These are the most common kind of bariatric surgery. There are different types of malabsorptive surgeries.  What are the different types of restrictive surgery? Adjustable Gastric Banding In this procedure, an inflatable band is placed around your stomach near the upper end. This makes the passageway for food into the rest of your stomach much smaller. The band can be adjusted, making it tighter or looser, by filling it with salt solution. Your surgeon can adjust the band based on how are you feeling and how much  weight you are losing. The band can be removed in the future. Vertical Banded Gastroplasty In this procedure, staples are used to separate your stomach into two parts, a small upper pouch and a bigger lower pouch. This decreases how much food you can eat. Sleeve Gastrectomy In this procedure, your stomach is made smaller. This is done by surgically removing a large part of your stomach. When your stomach is smaller, you feel full more quickly and reduce how much you eat. What are the different types of malabsorptive surgery? Roux-en-Y Gastric Bypass (RGB) This is the most common weight loss surgery. In this procedure, a small stomach pouch is created in the upper part of your stomach. Next, this small stomach pouch is attached directly to the middle part of your small intestine. The farther down your small intestine the new connection is made, the fewer calories and nutrients you will absorb. Biliopancreatic Diversion with Duodenal Switch (BPD/DS) This is a multi-step procedure. In this procedure, a large part of your stomach is removed, making your stomach smaller. Next, this smaller stomach is attached to the lower part of your small intestine. Like the RGB surgery, you absorb fewer calories and nutrients the farther down your small intestine the attachment is made. What are the risks of bariatric surgery? As with any surgical procedure, each type of bariatric surgery has its own risks. These risks also depend on your age, your overall health, and any other medical conditions you may have. When deciding on bariatric surgery, it is very important to:  Talk to your health care provider and choose the surgery that is best for   you.  Ask your health care provider about specific risks for the surgery you choose.  Where to find more information:  American Society for Metabolic & Bariatric Surgery: www.asmbs.org  Weight-control Information Network (WIN): win.niddk.nih.gov This information is not  intended to replace advice given to you by your health care provider. Make sure you discuss any questions you have with your health care provider. Document Released: 10/31/2005 Document Revised: 04/07/2016 Document Reviewed: 05/01/2013 Elsevier Interactive Patient Education  2017 Elsevier Inc.  

## 2017-01-19 NOTE — Discharge Summary (Signed)
Physician Discharge Summary  Patient ID: [name] MRN: [id number] DOB/AGE: Jun 07, 1966 51 y.o.  Admit date: 01/16/2017 Discharge date: 01/19/2017  Admission Diagnoses:  Dysphagia after prior Nissen fundoplication  Discharge Diagnoses:  same  Active Problems:   Dysphagia   Surgery:  Laparoscopic takedown of prior Nissen fundoplication  Discharged Condition: improved   Hospital Course:   Had surgery.  Slow progress postop.  Chest pain and dysphoria on PD 2-workup negative-restarted depression meds.  Improved and ready for discharge on PD 3.  Has been on MRSA protocol.    Consults: none  Significant Diagnostic Studies: CXR    Discharge Exam: Blood pressure 140/81, pulse (!) 59, temperature 98 F (36.7 C), temperature source Oral, resp. rate 16, height 5\' 6"  (1.676 m), weight 110.2 kg (243 lb), SpO2 100 %. Incisions OK  Disposition: 01-Home or Self Care  Discharge Instructions    Ambulate hourly while awake    Complete by:  As directed    Call MD for:  difficulty breathing, headache or visual disturbances    Complete by:  As directed    Call MD for:  persistant dizziness or light-headedness    Complete by:  As directed    Call MD for:  persistant nausea and vomiting    Complete by:  As directed    Call MD for:  redness, tenderness, or signs of infection (pain, swelling, redness, odor or green/yellow discharge around incision site)    Complete by:  As directed    Call MD for:  severe uncontrolled pain    Complete by:  As directed    Call MD for:  temperature >101 F    Complete by:  As directed    Diet bariatric full liquid    Complete by:  As directed    Discharge instructions    Complete by:  As directed    Consult your chronic pain management doctor about pain meds if needed.   Incentive spirometry    Complete by:  As directed    Perform hourly while awake     Allergies as of 01/19/2017      Reactions   Adhesive [tape] Other (See Comments)   Makes skin pull off    Codeine Hives, Itching   Macrobid [nitrofurantoin Monohyd Macro] Hives, Itching   Neosporin [neomycin-bacitracin Zn-polymyx] Rash      Medication List    TAKE these medications   acetaminophen 500 MG tablet Commonly known as:  TYLENOL Take 1,000 mg by mouth every 6 (six) hours as needed (pain).   albuterol 108 (90 Base) MCG/ACT inhaler Commonly known as:  PROVENTIL HFA;VENTOLIN HFA Inhale 2 puffs into the lungs every 6 (six) hours as needed for wheezing or shortness of breath.   ALPRAZolam 1 MG tablet Commonly known as:  XANAX Take 1 tablet (1 mg total) by mouth 3 (three) times daily. What changed:  when to take this  reasons to take this   DULoxetine 60 MG capsule Commonly known as:  CYMBALTA Take 1 capsule (60 mg total) by mouth daily.   folic acid 1 MG tablet Commonly known as:  FOLVITE Take 1 mg by mouth 2 (two) times daily.   furosemide 20 MG tablet Commonly known as:  LASIX Take 20 mg by mouth daily.   metFORMIN 1000 MG tablet Commonly known as:  GLUCOPHAGE Take 1,000 mg by mouth 2 (two) times daily with a meal.   metoprolol succinate 100 MG 24 hr tablet Commonly known as:  TOPROL-XL Take 100  mg by mouth daily. Take with or immediately following a meal.   ondansetron 4 MG tablet Commonly known as:  ZOFRAN Take 4 mg by mouth every 8 (eight) hours as needed for nausea or vomiting.   oxyCODONE-acetaminophen 10-325 MG tablet Commonly known as:  PERCOCET Take 1 tablet by mouth 2 (two) times daily.   OXYGEN Inhale 2 L into the lungs at bedtime.   pantoprazole 40 MG tablet Commonly known as:  PROTONIX Take 80 mg by mouth daily.   potassium chloride SA 20 MEQ tablet Commonly known as:  K-DUR,KLOR-CON Take 20 mEq by mouth 3 (three) times daily.   ranitidine 300 MG tablet Commonly known as:  ZANTAC Take 300 mg by mouth at bedtime.   spironolactone-hydrochlorothiazide 25-25 MG tablet Commonly known as:  ALDACTAZIDE Take 1 tablet by mouth daily.    vitamin B-12 1000 MCG tablet Commonly known as:  CYANOCOBALAMIN Take 1,000 mcg by mouth daily.   Vitamin D-3 5000 units Tabs Take 5,000 Units by mouth daily.   zolpidem 10 MG tablet Commonly known as:  AMBIEN Take 1 tablet (10 mg total) by mouth at bedtime.      Follow-up Information    Valarie Merino, MD Follow up.   Specialty:  General Surgery Contact information: [address] [place] [address] [phone]           Signed: Valarie Merino 01/19/2017, 8:39 AM

## 2017-01-19 NOTE — Progress Notes (Signed)
Pt tolerating liquid food intake and ambulating with no difficulty.  Still complaining of burning when urinating.  Pain med was given before discharge.  D/C instructions were given and she verbalized understanding if the instructions.

## 2017-01-20 LAB — URINE CULTURE

## 2017-02-03 NOTE — Op Note (Signed)
[name]:  [name], [name]                 ACCOUNT NO.:  [id number]  MEDICAL RECORD NO.:  [id number]  LOCATION:      [facility]                           FACILITY:  PHYSICIAN:  Thornton ParkMatthew B. Daphine DeutscherMartin, MD  DATE OF BIRTH:  [birth year]  DATE OF PROCEDURE: January 16, 2017 DATE OF DISCHARGE:                              OPERATIVE REPORT   PREOPERATIVE DIAGNOSIS:  Prior Nissen fundoplication a year and a half ago [place] with [place]ph[place]ia.  POSTOPERATIVE DIAGNOSIS:  Prior Nissen fundoplication a year and a half ago [place] in [place] with dysphagia with Nissen creating a torsion at the EG junction.  PROCEDURE:  Compicated laparoscopic takedown of Nissen fundoplication and resection of perforated portion of the wrap-closure of perforation, upper endoscopy by Dr. Andrey CampanileWilson to rule out perforation.  SURGEON:  Thornton ParkMatthew B. Daphine DeutscherMartin, MD FACS  ASSISTANT:  Gaynelle AduEric Wilson. MD, FACS  ANESTHESIA:  General endotracheal.  DESCRIPTION OF PROCEDURE:  The patient was taken to room 1 and given general anesthesia.  The abdomen was entered through the left upper quadrant with a 5-mm Optiview without difficulty.  Abdomen was insufflated.  The lower portion of the abdomen did not have any problems with adhesions.  I put the [facility] retractor in the upper midline, used a standard 6 port including 12 just to the right of the umbilicus. I took down the area where the wrap appeared to be really much lower involving stomach to stomach and a suture and also there were lateral adhesions to the stomach.  These were taken down sharply.  Eventually after 2.5 hours of dissection, I was able to free the foregut.  I had taken down the actual plication, which appeared to be fairly eccentrically located with a wrap.  I had read prior surgeon's op note and the previous surgeon had gotten into some bleeding posteriorly where he thought was in the spleen.  We encountered a little bleeding in that area and  controlled that with electrocautery.  In the course of manipulating the portion of the stomach that had been wrapped around, I noted an enterotomy and just felt like that was necessary and it required the traction and countertraction to enable me to dissect where this was ferociously stuck to the diaphragmatic closure which was probably done with the pledgets because the stomach was pretty well fused to that.  I got this all taken down posteriorly and then went ahead and elevated out on the left side, I went ahead and stapled off that little spot using an Endo-GIA with a purple load 6-cm with TRS. There was no hiatal hernia that we noted.  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  I elected because her main problem had been dysphagia or difficulty swallowing that I would not rewrap her because I felt like that would likely re-create dysphagia. Also, the only dilators available are 50-French bougie and the other ones are out of stock. This was a complicated redo procedure and  Dr. Tawana ScaleWilson's expert  assistance was absolutely needed.    All the port sites were injected with Exparel.  The abdomen was deflated, and prior to removing the camera, we looked at the gallbladder again, and we had studied it before and after, it was a nice Robin egg blue with some fatty infiltration medially, but there was no evidence of any acute or subacute or chronic cholecystitis.  The patient seemed to tolerate the procedure well, was taken to recovery room in satisfactory condition. The use of a surgical MD assistant was medically necessary to allow the safe performance of this procedure-to expose and repair and  This most difficult redo procedure.       Thornton Park Daphine Deutscher, MD FACS   ______________________________ Thornton Park Daphine Deutscher,  MD    MBM/MEDQ  D:  01/16/2017  T:  01/16/2017  Job:  [id number]

## 2017-02-13 ENCOUNTER — Encounter (HOSPITAL_COMMUNITY): Payer: Self-pay | Admitting: Psychiatry

## 2017-02-13 ENCOUNTER — Ambulatory Visit (INDEPENDENT_AMBULATORY_CARE_PROVIDER_SITE_OTHER): Payer: Medicare HMO | Admitting: Psychiatry

## 2017-02-13 VITALS — BP 144/86 | HR 85 | Resp 18 | Wt 243.0 lb

## 2017-02-13 DIAGNOSIS — Z79899 Other long term (current) drug therapy: Secondary | ICD-10-CM

## 2017-02-13 DIAGNOSIS — Z813 Family history of other psychoactive substance abuse and dependence: Secondary | ICD-10-CM | POA: Diagnosis not present

## 2017-02-13 DIAGNOSIS — Z888 Allergy status to other drugs, medicaments and biological substances status: Secondary | ICD-10-CM

## 2017-02-13 DIAGNOSIS — F332 Major depressive disorder, recurrent severe without psychotic features: Secondary | ICD-10-CM | POA: Diagnosis not present

## 2017-02-13 DIAGNOSIS — F4010 Social phobia, unspecified: Secondary | ICD-10-CM | POA: Diagnosis not present

## 2017-02-13 DIAGNOSIS — F411 Generalized anxiety disorder: Secondary | ICD-10-CM | POA: Diagnosis not present

## 2017-02-13 DIAGNOSIS — Z811 Family history of alcohol abuse and dependence: Secondary | ICD-10-CM

## 2017-02-13 MED ORDER — ZOLPIDEM TARTRATE 10 MG PO TABS: 10.0000 mg | ORAL_TABLET | Freq: Every day | ORAL | 2 refills | Status: DC

## 2017-02-13 MED ORDER — ALPRAZOLAM 1 MG PO TABS: 1.0000 mg | ORAL_TABLET | Freq: Three times a day (TID) | ORAL | 2 refills | Status: DC

## 2017-02-13 MED ORDER — DULOXETINE HCL 60 MG PO CPEP: 60.0000 mg | ORAL_CAPSULE | Freq: Every day | ORAL | 2 refills | Status: DC
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
## 2017-03-29 ENCOUNTER — Ambulatory Visit (HOSPITAL_COMMUNITY): Payer: Self-pay | Admitting: Psychiatry

## 2017-04-17 NOTE — Addendum Note (Signed)
Addendum  created 04/17/17 1110 by Jacquese Hackman, MD   Sign clinical note    

## 2017-04-17 NOTE — Anesthesia Postprocedure Evaluation (Signed)
Anesthesia Post Note  Patient: [name]  Procedure(s) Performed: Procedure(s) (LRB): ESOPHAGOGASTRODUODENOSCOPY (EGD) WITH PROPOFOL (N/A)     Anesthesia Post Evaluation  Last Vitals:  Vitals:   12/26/16 1210 12/26/16 1220  BP: 136/80 134/83  Pulse: 71 85  Resp: 12 18  Temp:      Last Pain:  Vitals:   12/26/16 1156  TempSrc: Oral                 Mellonie Guess S

## 2017-05-02 ENCOUNTER — Encounter (HOSPITAL_COMMUNITY): Payer: Self-pay | Admitting: Psychiatry

## 2017-05-02 ENCOUNTER — Ambulatory Visit (INDEPENDENT_AMBULATORY_CARE_PROVIDER_SITE_OTHER): Payer: Medicare HMO | Admitting: Psychiatry

## 2017-05-02 VITALS — BP 140/96 | HR 81 | Ht 66.0 in | Wt 239.4 lb

## 2017-05-02 DIAGNOSIS — Z813 Family history of other psychoactive substance abuse and dependence: Secondary | ICD-10-CM

## 2017-05-02 DIAGNOSIS — Z811 Family history of alcohol abuse and dependence: Secondary | ICD-10-CM

## 2017-05-02 DIAGNOSIS — F411 Generalized anxiety disorder: Secondary | ICD-10-CM | POA: Diagnosis not present

## 2017-05-02 DIAGNOSIS — F332 Major depressive disorder, recurrent severe without psychotic features: Secondary | ICD-10-CM

## 2017-05-02 DIAGNOSIS — F4010 Social phobia, unspecified: Secondary | ICD-10-CM

## 2017-05-02 MED ORDER — MIRTAZAPINE 30 MG PO TABS: 30.0000 mg | ORAL_TABLET | Freq: Every day | ORAL | 2 refills | Status: DC

## 2017-05-02 MED ORDER — ALPRAZOLAM 1 MG PO TABS: 1.0000 mg | ORAL_TABLET | Freq: Three times a day (TID) | ORAL | 2 refills | Status: DC

## 2017-05-02 MED ORDER — DULOXETINE HCL 60 MG PO CPEP
60.0000 mg | ORAL_CAPSULE | Freq: Every day | ORAL | 2 refills | Status: DC
Start: 2017-05-02 — End: 2017-06-15

## 2017-05-02 MED ORDER — ZOLPIDEM TARTRATE 10 MG PO TABS: 10.0000 mg | ORAL_TABLET | Freq: Every day | ORAL | 2 refills | Status: DC
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
## 2017-05-25 ENCOUNTER — Ambulatory Visit (HOSPITAL_COMMUNITY): Payer: Self-pay | Admitting: Psychiatry

## 2017-06-05 ENCOUNTER — Other Ambulatory Visit: Payer: Self-pay | Admitting: Surgery

## 2017-06-05 DIAGNOSIS — K9189 Other postprocedural complications and disorders of digestive system: Secondary | ICD-10-CM

## 2017-06-09 ENCOUNTER — Ambulatory Visit
Admission: RE | Admit: 2017-06-09 | Discharge: 2017-06-09 | Disposition: A | Discharge status: Home / Self Care | Payer: Medicare HMO | Source: Ambulatory Visit | Attending: Surgery | Admitting: Surgery

## 2017-06-09 DIAGNOSIS — K9189 Other postprocedural complications and disorders of digestive system: Secondary | ICD-10-CM

## 2017-06-15 ENCOUNTER — Encounter (HOSPITAL_COMMUNITY): Payer: Self-pay | Admitting: Psychiatry

## 2017-06-15 ENCOUNTER — Ambulatory Visit (INDEPENDENT_AMBULATORY_CARE_PROVIDER_SITE_OTHER): Payer: Medicare HMO | Admitting: Psychiatry

## 2017-06-15 VITALS — BP 121/71 | HR 77 | Ht 66.0 in | Wt 244.0 lb

## 2017-06-15 DIAGNOSIS — Z811 Family history of alcohol abuse and dependence: Secondary | ICD-10-CM

## 2017-06-15 DIAGNOSIS — F4010 Social phobia, unspecified: Secondary | ICD-10-CM | POA: Diagnosis not present

## 2017-06-15 DIAGNOSIS — F411 Generalized anxiety disorder: Secondary | ICD-10-CM

## 2017-06-15 DIAGNOSIS — Z813 Family history of other psychoactive substance abuse and dependence: Secondary | ICD-10-CM

## 2017-06-15 DIAGNOSIS — F332 Major depressive disorder, recurrent severe without psychotic features: Secondary | ICD-10-CM

## 2017-06-15 MED ORDER — DULOXETINE HCL 60 MG PO CPEP: 60.0000 mg | ORAL_CAPSULE | Freq: Every day | ORAL | 2 refills | Status: DC

## 2017-06-15 MED ORDER — ZOLPIDEM TARTRATE 10 MG PO TABS: 10.0000 mg | ORAL_TABLET | Freq: Every day | ORAL | 2 refills | Status: DC

## 2017-06-15 MED ORDER — ALPRAZOLAM 1 MG PO TABS: 1.0000 mg | ORAL_TABLET | Freq: Three times a day (TID) | ORAL | 2 refills | Status: DC

## 2017-06-15 MED ORDER — AMITRIPTYLINE HCL 25 MG PO TABS: 25.0000 mg | ORAL_TABLET | Freq: Every day | ORAL | 2 refills | Status: DC
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
## 2017-07-26 ENCOUNTER — Ambulatory Visit (HOSPITAL_COMMUNITY): Payer: Medicare HMO | Admitting: Psychiatry

## 2017-08-02 ENCOUNTER — Other Ambulatory Visit: Payer: Self-pay | Admitting: Surgery

## 2017-08-03 ENCOUNTER — Other Ambulatory Visit (HOSPITAL_COMMUNITY): Payer: Self-pay | Admitting: Surgery

## 2017-08-03 ENCOUNTER — Other Ambulatory Visit: Payer: Self-pay | Admitting: Surgery

## 2017-08-03 DIAGNOSIS — K3184 Gastroparesis: Secondary | ICD-10-CM

## 2017-08-04 ENCOUNTER — Ambulatory Visit (HOSPITAL_COMMUNITY)
Admission: RE | Admit: 2017-08-04 | Discharge: 2017-08-04 | Disposition: A | Discharge status: Home / Self Care | Payer: Medicare HMO | Source: Ambulatory Visit | Attending: Surgery | Admitting: Surgery

## 2017-08-04 DIAGNOSIS — K3184 Gastroparesis: Secondary | ICD-10-CM

## 2017-08-04 DIAGNOSIS — K769 Liver disease, unspecified: Secondary | ICD-10-CM | POA: Insufficient documentation

## 2017-08-09 ENCOUNTER — Ambulatory Visit (INDEPENDENT_AMBULATORY_CARE_PROVIDER_SITE_OTHER): Payer: Medicare HMO | Admitting: Psychiatry

## 2017-08-09 ENCOUNTER — Encounter (HOSPITAL_COMMUNITY): Payer: Self-pay | Admitting: Psychiatry

## 2017-08-09 VITALS — BP 134/95 | HR 73 | Ht 66.0 in | Wt 244.4 lb

## 2017-08-09 DIAGNOSIS — F332 Major depressive disorder, recurrent severe without psychotic features: Secondary | ICD-10-CM | POA: Diagnosis not present

## 2017-08-09 DIAGNOSIS — M2550 Pain in unspecified joint: Secondary | ICD-10-CM

## 2017-08-09 DIAGNOSIS — F411 Generalized anxiety disorder: Secondary | ICD-10-CM | POA: Diagnosis not present

## 2017-08-09 DIAGNOSIS — M549 Dorsalgia, unspecified: Secondary | ICD-10-CM | POA: Diagnosis not present

## 2017-08-09 DIAGNOSIS — F4010 Social phobia, unspecified: Secondary | ICD-10-CM

## 2017-08-09 DIAGNOSIS — Z560 Unemployment, unspecified: Secondary | ICD-10-CM

## 2017-08-09 DIAGNOSIS — Z811 Family history of alcohol abuse and dependence: Secondary | ICD-10-CM

## 2017-08-09 DIAGNOSIS — Z6379 Other stressful life events affecting family and household: Secondary | ICD-10-CM

## 2017-08-09 DIAGNOSIS — R450 Nervousness: Secondary | ICD-10-CM | POA: Diagnosis not present

## 2017-08-09 DIAGNOSIS — M791 Myalgia: Secondary | ICD-10-CM

## 2017-08-09 DIAGNOSIS — Z813 Family history of other psychoactive substance abuse and dependence: Secondary | ICD-10-CM

## 2017-08-09 MED ORDER — AMITRIPTYLINE HCL 75 MG PO TABS: 75.0000 mg | ORAL_TABLET | Freq: Every day | ORAL | 2 refills | Status: DC

## 2017-08-09 MED ORDER — ALPRAZOLAM 1 MG PO TABS: 1.0000 mg | ORAL_TABLET | Freq: Three times a day (TID) | ORAL | 2 refills | Status: DC

## 2017-08-09 MED ORDER — ZOLPIDEM TARTRATE 10 MG PO TABS: 10.0000 mg | ORAL_TABLET | Freq: Every day | ORAL | 2 refills | Status: DC

## 2017-08-09 MED ORDER — DULOXETINE HCL 60 MG PO CPEP: 60.0000 mg | ORAL_CAPSULE | Freq: Every day | ORAL | 2 refills | Status: DC
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
## 2017-08-31 ENCOUNTER — Telehealth (HOSPITAL_COMMUNITY): Payer: Self-pay | Admitting: *Deleted

## 2017-08-31 ENCOUNTER — Other Ambulatory Visit (HOSPITAL_COMMUNITY): Payer: Self-pay | Admitting: Psychiatry

## 2017-08-31 MED ORDER — AMITRIPTYLINE HCL 75 MG PO TABS: 75.0000 mg | ORAL_TABLET | Freq: Every day | ORAL | 2 refills | Status: DC

## 2017-08-31 NOTE — Telephone Encounter (Signed)
sent 

## 2017-08-31 NOTE — Telephone Encounter (Signed)
Pt [facility] [place] requesting 90 days supply for pt [name] 75 mg QHS. Per pt chart, pt medication was last filled on 08-09-2017 with 30 tabs 2 refills. Pt pharmacy number is [phone].

## 2017-09-01 NOTE — Telephone Encounter (Signed)
noted 

## 2017-09-20 ENCOUNTER — Ambulatory Visit (HOSPITAL_COMMUNITY): Payer: Medicare HMO | Admitting: Psychiatry

## 2017-09-20 ENCOUNTER — Encounter (HOSPITAL_COMMUNITY): Payer: Self-pay | Admitting: Psychiatry

## 2017-09-20 VITALS — BP 138/90 | HR 73 | Ht 66.0 in | Wt 242.0 lb

## 2017-09-20 DIAGNOSIS — F332 Major depressive disorder, recurrent severe without psychotic features: Secondary | ICD-10-CM | POA: Diagnosis not present

## 2017-09-20 MED ORDER — MIRTAZAPINE 30 MG PO TABS: 30.0000 mg | ORAL_TABLET | Freq: Every day | ORAL | 2 refills | Status: DC

## 2017-09-20 MED ORDER — ALPRAZOLAM 1 MG PO TABS: 1.0000 mg | ORAL_TABLET | Freq: Three times a day (TID) | ORAL | 2 refills | Status: DC

## 2017-09-20 MED ORDER — DULOXETINE HCL 60 MG PO CPEP: 60.0000 mg | ORAL_CAPSULE | Freq: Every day | ORAL | 2 refills | Status: DC

## 2017-09-20 MED ORDER — ZOLPIDEM TARTRATE 10 MG PO TABS: 10.0000 mg | ORAL_TABLET | Freq: Every day | ORAL | 2 refills | Status: DC
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
## 2017-11-20 ENCOUNTER — Ambulatory Visit (HOSPITAL_COMMUNITY): Payer: Medicare HMO | Admitting: Psychiatry

## 2017-11-20 ENCOUNTER — Encounter (HOSPITAL_COMMUNITY): Payer: Self-pay | Admitting: Psychiatry

## 2017-11-20 VITALS — BP 121/84 | HR 65 | Ht 66.0 in | Wt 231.0 lb

## 2017-11-20 DIAGNOSIS — F419 Anxiety disorder, unspecified: Secondary | ICD-10-CM

## 2017-11-20 DIAGNOSIS — G4700 Insomnia, unspecified: Secondary | ICD-10-CM | POA: Diagnosis not present

## 2017-11-20 DIAGNOSIS — Z811 Family history of alcohol abuse and dependence: Secondary | ICD-10-CM

## 2017-11-20 DIAGNOSIS — R450 Nervousness: Secondary | ICD-10-CM | POA: Diagnosis not present

## 2017-11-20 DIAGNOSIS — R109 Unspecified abdominal pain: Secondary | ICD-10-CM

## 2017-11-20 DIAGNOSIS — Z813 Family history of other psychoactive substance abuse and dependence: Secondary | ICD-10-CM

## 2017-11-20 DIAGNOSIS — F332 Major depressive disorder, recurrent severe without psychotic features: Secondary | ICD-10-CM | POA: Diagnosis not present

## 2017-11-20 DIAGNOSIS — Z560 Unemployment, unspecified: Secondary | ICD-10-CM | POA: Diagnosis not present

## 2017-11-20 MED ORDER — ZOLPIDEM TARTRATE 10 MG PO TABS: 10.0000 mg | ORAL_TABLET | Freq: Every day | ORAL | 2 refills | Status: DC

## 2017-11-20 MED ORDER — MIRTAZAPINE 30 MG PO TABS: 30.0000 mg | ORAL_TABLET | Freq: Every day | ORAL | 2 refills | Status: DC

## 2017-11-20 MED ORDER — DULOXETINE HCL 60 MG PO CPEP: 60.0000 mg | ORAL_CAPSULE | Freq: Every day | ORAL | 2 refills | Status: DC

## 2017-11-20 MED ORDER — ALPRAZOLAM 1 MG PO TABS: 1.0000 mg | ORAL_TABLET | Freq: Three times a day (TID) | ORAL | 2 refills | Status: DC
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
## 2017-11-30 ENCOUNTER — Other Ambulatory Visit: Payer: Self-pay | Admitting: Gastroenterology

## 2017-12-06 ENCOUNTER — Other Ambulatory Visit: Payer: Self-pay

## 2017-12-06 ENCOUNTER — Encounter (HOSPITAL_COMMUNITY): Payer: Self-pay | Admitting: *Deleted

## 2017-12-06 ENCOUNTER — Other Ambulatory Visit (HOSPITAL_COMMUNITY): Payer: Self-pay | Admitting: Psychiatry

## 2017-12-06 MED ORDER — MIRTAZAPINE 30 MG PO TABS: 30.0000 mg | ORAL_TABLET | Freq: Every day | ORAL | 2 refills | Status: DC

## 2017-12-06 NOTE — Progress Notes (Signed)
Pt denies SOB, chest pain, and being under the care of a cardiologist. Pt denies having a cardiac cath but stated that a stress test and echo were performed 3-4 years ago at [facility] in [place], [place].. " Everything was okay, I didn't have to go back. They did that because my sister died of a heart attack."  Pt denies having recent labs. Pt made aware to not take Metformin DOS. Pt made aware to check BG every 2 hours prior to arrival to hospital on DOS. Pt made aware to treat a BG < 70 with 4 glucose tabs, wait 15 minutes after taking tabs to recheck BG, if BG remains < 70, call Endo unit to speak with a nurse. Pt made aware to stop taking Aspirin, vitamins, fish oil and herbal medications. Do not take any NSAIDs ie: Ibuprofen, Advil, Naproxen (Aleve), Motrin, BC and Goody Powder. Pt verbalized understanding of all pre-op instructions.

## 2017-12-07 ENCOUNTER — Other Ambulatory Visit: Payer: Self-pay

## 2017-12-07 ENCOUNTER — Encounter (HOSPITAL_COMMUNITY): Payer: Self-pay | Admitting: Certified Registered Nurse Anesthetist

## 2017-12-07 ENCOUNTER — Ambulatory Visit (HOSPITAL_COMMUNITY)
Admission: RE | Admit: 2017-12-07 | Discharge: 2017-12-07 | Disposition: A | Discharge status: Home / Self Care | Payer: Medicare HMO | Source: Ambulatory Visit | Attending: Gastroenterology | Admitting: Gastroenterology

## 2017-12-07 ENCOUNTER — Ambulatory Visit (HOSPITAL_COMMUNITY): Payer: Medicare HMO | Admitting: Anesthesiology

## 2017-12-07 ENCOUNTER — Encounter (HOSPITAL_COMMUNITY)
Admission: RE | Disposition: A | Discharge status: Home / Self Care | Payer: Self-pay | Source: Ambulatory Visit | Attending: Gastroenterology

## 2017-12-07 DIAGNOSIS — R112 Nausea with vomiting, unspecified: Secondary | ICD-10-CM | POA: Insufficient documentation

## 2017-12-07 DIAGNOSIS — R1310 Dysphagia, unspecified: Secondary | ICD-10-CM | POA: Diagnosis not present

## 2017-12-07 DIAGNOSIS — K449 Diaphragmatic hernia without obstruction or gangrene: Secondary | ICD-10-CM | POA: Diagnosis not present

## 2017-12-07 DIAGNOSIS — K209 Esophagitis, unspecified: Secondary | ICD-10-CM | POA: Insufficient documentation

## 2017-12-07 HISTORY — DX: Personal history of other diseases of the digestive system: Z87.19

## 2017-12-07 HISTORY — PX: ESOPHAGOGASTRODUODENOSCOPY (EGD) WITH PROPOFOL: SHX5813

## 2017-12-07 SURGERY — ESOPHAGOGASTRODUODENOSCOPY (EGD) WITH PROPOFOL
Anesthesia: Monitor Anesthesia Care

## 2017-12-07 MED ORDER — LIDOCAINE HCL (CARDIAC) 20 MG/ML IV SOLN
INTRAVENOUS | Status: DC | PRN
  Administered 2017-12-07: 40 mg via INTRAVENOUS

## 2017-12-07 MED ORDER — LACTATED RINGERS IV SOLN
INTRAVENOUS | Status: DC
  Administered 2017-12-07: 1000 mL via INTRAVENOUS
  Administered 2017-12-07: 09:00:00 via INTRAVENOUS

## 2017-12-07 MED ORDER — PROPOFOL 500 MG/50ML IV EMUL
INTRAVENOUS | Status: DC | PRN
  Administered 2017-12-07: 65 ug/kg/min via INTRAVENOUS

## 2017-12-07 MED ORDER — ONDANSETRON HCL 4 MG/2ML IJ SOLN
INTRAMUSCULAR | Status: DC | PRN
  Administered 2017-12-07: 4 mg via INTRAVENOUS

## 2017-12-07 MED ORDER — PROPOFOL 10 MG/ML IV BOLUS
INTRAVENOUS | Status: DC | PRN
  Administered 2017-12-07: 20 mg via INTRAVENOUS

## 2017-12-07 MED ORDER — SODIUM CHLORIDE 0.9 % IV SOLN: INTRAVENOUS | Status: DC

## 2017-12-07 SURGICAL SUPPLY — 14 items

## 2017-12-07 NOTE — Anesthesia Preprocedure Evaluation (Addendum)
Anesthesia Evaluation  Patient identified by MRN, date of birth, ID band Patient awake    Reviewed: Allergy & Precautions, H&P , NPO status , Patient's Chart, lab work & pertinent test results  History of Anesthesia Complications (+) PONV  Airway Mallampati: II  TM Distance: >3 FB Neck ROM: Full    Dental no notable dental hx. (+) Upper Dentures, Dental Advisory Given   Pulmonary shortness of breath, with exertion and lying, sleep apnea and Oxygen sleep apnea ,  Home oxygen 2L at night   Pulmonary exam normal breath sounds clear to auscultation       Cardiovascular Exercise Tolerance: Good hypertension, + Valvular Problems/Murmurs  Rhythm:regular Rate:Normal     Neuro/Psych Anxiety Depression    GI/Hepatic   Endo/Other  diabetes, Type 2  Renal/GU      Musculoskeletal  (+) Arthritis ,   Abdominal   Peds  Hematology  (+) anemia ,   Anesthesia Other Findings   Reproductive/Obstetrics                            Anesthesia Physical Anesthesia Plan  ASA: III  Anesthesia Plan: MAC   Post-op Pain Management:    Induction: Intravenous  PONV Risk Score and Plan:   Airway Management Planned: Nasal Cannula  Additional Equipment:   Intra-op Plan:   Post-operative Plan:   Informed Consent: I have reviewed the patients History and Physical, chart, labs and discussed the procedure including the risks, benefits and alternatives for the proposed anesthesia with the patient or authorized representative who has indicated his/her understanding and acceptance.   Dental advisory given  Plan Discussed with: CRNA and Anesthesiologist  Anesthesia Plan Comments:         Anesthesia Quick Evaluation

## 2017-12-07 NOTE — H&P (Signed)
Subjective:   Patient is a 52 y.o. female presents with a multitude of chronic problems. She is diabetic and has sleep apnea chronic pain requiring oxycodone. She's had multiple surgeries that include a Nissen done in [place] that was subsequently evaluated by Dr. Daphine Deutscher and felt to be a slipped Nissen and she underwent revision 3/18 hearing [facility] by Dr. Daphine Deutscher. This was a very difficult procedure she had EGD by Dr. Andrey Campanile at the time of the procedure. No clear defect in the esophagus. There apparently was a lot of scar tissue from her previous surgery. She did well for several weeks and then began to have problems eating again with regurgitation of food. Had a gastric emptying scan was apparently done in [place]. We receive the report was difficult to interpret the results but it appeared that the gastric emptying was not grossly abnormal. An upper G.I. was obtained showed a lot of material in her stomach and the issue of gastric outlet obstruction was brought up. She has been using oxygen at night for sleep apnea and continues to vomit regurgitate food at least 3 times a week. And endoscopy is performed in order to determine if she has a gastric outlet obstruction. She also has a prior history of Barrett's esophagus but we have no records on this. . Procedure including risks and benefits discussed in office.  Patient Active Problem List   Diagnosis Date Noted  . Dysphagia 01/16/2017  . Major depression 03/31/2015  . Back pain 10/23/2013   Past Medical History:  Diagnosis Date  . Anemia   . Anxiety   . Arthritis   . Chronic kidney disease    IGA  . Complication of anesthesia   . Depression   . Diabetes mellitus without complication (HCC)    type II   . GERD (gastroesophageal reflux disease)   . Heart murmur    since birth- no need to be concern  . History of hiatal hernia   . History of kidney stones    hx of   . Hypertension   . PONV (postoperative nausea and vomiting)    . Shortness of breath    With exertion  . Sleep apnea    severe sleep apnea uses oxygen 2l at nite     Past Surgical History:  Procedure Laterality Date  . ABDOMINAL HYSTERECTOMY    . BREAST SURGERY Right    biospy x2  . CATARACT EXTRACTION     right eye  . devaited spetum surgery     . DILATION AND CURETTAGE OF UTERUS    . ESOPHAGOGASTRODUODENOSCOPY (EGD) WITH PROPOFOL N/A 12/26/2016   Procedure: ESOPHAGOGASTRODUODENOSCOPY (EGD) WITH PROPOFOL;  Surgeon: Luretha Murphy, MD;  Location: [facility];  Service: General;  Laterality: N/A;  . LAPAROSCOPIC NISSEN FUNDOPLICATION N/A 01/16/2017   Procedure: LAPAROSCOPIC ENTEROLYSIS TAKEDOWN OF PRIOR NISSEN FUNDOPLICATION WITH UPPER ENDOSCOPY;  Surgeon: Luretha Murphy, MD;  Location: [facility];  Service: General;  Laterality: N/A;  . LUMBAR LAMINECTOMY/DECOMPRESSION MICRODISCECTOMY  10/23/2013   L4 L5   DR Shon Baton  . LUMBAR LAMINECTOMY/DECOMPRESSION MICRODISCECTOMY N/A 10/23/2013   Procedure: DECOMPRESSION AND FACET REMOVAL L4 - L5 1 LEVEL;  Surgeon: Venita Lick, MD;  Location: [facility];  Service: Orthopedics;  Laterality: N/A;  . nasal repair collapse      both sides of nose and 2 plates added   . NASAL SINUS SURGERY    . NISSEN FUNDOPLICATION    . right foot surgery     . SEPTOPLASTY  2013  .  SHOULDER ARTHROSCOPY WITH ROTATOR CUFF REPAIR Right    x 2  . WRIST SURGERY Left    work injury    Medications Prior to Admission  Medication Sig Dispense Refill Last Dose  . ALPRAZolam (XANAX) 1 MG tablet Take 1 tablet (1 mg total) by mouth 3 (three) times daily. 90 tablet 2 Past Week at Unknown time  . Cholecalciferol (VITAMIN D-3) 5000 units TABS Take 5,000 Units by mouth daily.   Past Week at Unknown time  . DULoxetine (CYMBALTA) 60 MG capsule Take 1 capsule (60 mg total) by mouth daily. 90 capsule 2 12/06/2017 at Unknown time  . folic acid (FOLVITE) 1 MG tablet Take 1 mg by mouth 2 (two) times daily.   12/06/2017 at Unknown time  . furosemide  (LASIX) 20 MG tablet Take 20 mg by mouth daily.   12/06/2017 at Unknown time  . lisinopril (PRINIVIL,ZESTRIL) 5 MG tablet Take 5 mg by mouth daily.   12/07/2017 at 0500  . metFORMIN (GLUCOPHAGE) 1000 MG tablet Take 1,000 mg by mouth 2 (two) times daily with a meal.   12/06/2017 at Unknown time  . metoprolol succinate (TOPROL-XL) 100 MG 24 hr tablet Take 100 mg by mouth daily. Take with or immediately following a meal.   12/07/2017 at 0500  . mirtazapine (REMERON) 30 MG tablet Take 1 tablet (30 mg total) by mouth at bedtime. 90 tablet 2 Past Week at Unknown time  . ondansetron (ZOFRAN) 4 MG tablet Take 4 mg by mouth every 8 (eight) hours as needed for nausea or vomiting.   12/06/2017 at Unknown time  . oxyCODONE-acetaminophen (PERCOCET) 10-325 MG per tablet Take 1 tablet by mouth 2 (two) times daily.    12/06/2017 at Unknown time  . OXYGEN Inhale 2 L into the lungs at bedtime.   Taking  . pantoprazole (PROTONIX) 40 MG tablet Take 80 mg by mouth daily.    12/06/2017 at Unknown time  . potassium chloride SA (K-DUR,KLOR-CON) 20 MEQ tablet Take 20 mEq by mouth 3 (three) times daily.   12/06/2017 at Unknown time  . prednisoLONE acetate (PRED FORTE) 1 % ophthalmic suspension Place 1 drop into the left eye 2 (two) times daily.   12/07/2017 at 0500  . ranitidine (ZANTAC) 300 MG tablet Take 300 mg by mouth at bedtime.   12/06/2017 at Unknown time  . spironolactone-hydrochlorothiazide (ALDACTAZIDE) 25-25 MG tablet Take 1 tablet by mouth daily.   12/06/2017 at Unknown time  . vitamin B-12 (CYANOCOBALAMIN) 1000 MCG tablet Take 1,000 mcg by mouth daily.   Past Week at Unknown time  . zolpidem (AMBIEN) 10 MG tablet Take 1 tablet (10 mg total) by mouth at bedtime. 30 tablet 2 Past Week at Unknown time  . acetaminophen (TYLENOL) 500 MG tablet Take 1,000 mg by mouth every 6 (six) hours as needed (pain).    More than a month at Unknown time  . albuterol (PROVENTIL HFA;VENTOLIN HFA) 108 (90 Base) MCG/ACT inhaler Inhale 2 puffs  into the lungs every 4 (four) hours as needed for wheezing or shortness of breath.   More than a month at Unknown time  . metoCLOPramide (REGLAN) 5 MG tablet Take 5 mg by mouth 4 (four) times daily.   More than a month at Unknown time   Allergies  Allergen Reactions  . Adhesive [Tape] Other (See Comments)    Makes skin pull off *Band-aids and Plastic tape   . Codeine Hives and Itching  . Macrobid [Nitrofurantoin Monohyd Macro] Hives and Itching  .  Neosporin [Neomycin-Bacitracin Zn-Polymyx] Rash    Social History   Tobacco Use  . Smoking status: Never Smoker  . Smokeless tobacco: Never Used  Substance Use Topics  . Alcohol use: No    Family History  Problem Relation Age of Onset  . Alcohol abuse Brother   . Drug abuse Other   . Stroke Mother   . Hypertension Mother   . Diabetes Mother   . Kidney disease Mother   . Lung cancer Father   . Heart attack Sister   . Lung cancer Brother      Objective:   Patient Vitals for the past 8 hrs:  BP Temp Temp src Resp SpO2 Height Weight  12/07/17 0821 (!) 144/78 97.6 F (36.4 C) Oral 17 98 % 5\' 6"  (1.676 m) 104.8 kg (231 lb)   No intake/output data recorded. No intake/output data recorded.   See MD Preop evaluation      Assessment:   1. Nausea and vomiting. Question of gastric outlet obstruction and woman who has had previous Nissen that had to have it revised recently. 2. Esophageal reflux with history of medicine done in another location that had slipped and had to be revised hearing [place] 3. History of Barrett's esophagus 4. Type II diabetes 5. History of the depression and anxiety 6. OSA. She uses oxygen at night 7. Dysphagia. Most active when she is regurgitating food  Plan:   We will proceed with EGD to evaluate her esophagus, Nissen, gastric outlet obstruction etc. This is been discussed in detail with the patient.

## 2017-12-07 NOTE — Discharge Instructions (Signed)

## 2017-12-07 NOTE — Transfer of Care (Signed)
Immediate Anesthesia Transfer of Care Note  Patient: [name]  Procedure(s) Performed: ESOPHAGOGASTRODUODENOSCOPY (EGD) WITH PROPOFOL (N/A )  Patient Location: PACU  Anesthesia Type:MAC  Level of Consciousness: awake and alert   Airway & Oxygen Therapy: Patient Spontanous Breathing  Post-op Assessment: Report given to RN, Post -op Vital signs reviewed and stable and Patient moving all extremities X 4  Post vital signs: Reviewed and stable  Last Vitals:  Vitals:   12/07/17 0821 12/07/17 0936  BP: (!) 144/78 (!) 115/58  Pulse:  75  Resp: 17 16  Temp: 36.4 C 36.5 C  SpO2: 98% 97%    Last Pain:  Vitals:   12/07/17 0936  TempSrc: Oral  PainSc: 7          Complications: No apparent anesthesia complications

## 2017-12-07 NOTE — Op Note (Signed)
[facility] Patient Name: [name] Procedure Date : 12/07/2017 MRN: [id number] Attending MD: Tresea Mall Dr., MD Date of Birth: 1966/10/27 CSN: [id number] Age: 52 Admit Type: Outpatient Procedure:                Upper GI endoscopy Indications:              Dysphagia, Follow-up of Barrett's esophagus, Nausea                            with vomiting,has had prior Nissen fundoplication                            done years ago in another location that had to be                            revised in [place] due to slipped Nissen.                            Patient has had persistent nausea and vomiting in a                            question of retaining gastric contents. Providers:                Llana Aliment. Adarsh Mundorf Dr., MD, Bonney Leitz, Janie                            Billups, Technician, Little Ishikawa, CRNA Referring MD:             Dr. Wenda Low Medicines:                Monitored Anesthesia Care Complications:            No immediate complications. Estimated Blood Loss:     Estimated blood loss: none. Procedure:                Pre-Anesthesia Assessment:                           - Prior to the procedure, a History and Physical                            was performed, and patient medications and                            allergies were reviewed. The patient's tolerance of                            previous anesthesia was also reviewed. The risks                            and benefits of the procedure and the sedation                            options and risks were discussed with the patient.  All questions were answered, and informed consent                            was obtained. Prior Anticoagulants: The patient has                            taken no previous anticoagulant or antiplatelet                            agents. ASA Grade Assessment: III - A patient with                            severe systemic disease. After  reviewing the risks                            and benefits, the patient was deemed in                            satisfactory condition to undergo the procedure.                           After obtaining informed consent, the endoscope was                            passed under direct vision. Throughout the                            procedure, the patient's blood pressure, pulse, and                            oxygen saturations were monitored continuously. The                            [id number] ([id number]) scope was introduced through the                            mouth, and advanced to the third part of duodenum.                            The upper GI endoscopy was accomplished without                            difficulty. The patient tolerated the procedure                            well. Scope In: Scope Out: Findings:      The esophagus and gastroesophageal junction were examined with white       light. There were esophageal mucosal changes consistent with       long-segment Barrett's esophagus. These changes involved the mucosa at       the upper extent of the gastric folds (33 cm from the incisors)       extending to the Z-line. Tongues of salmon-colored mucosa were present.       The maximum  longitudinal extent of these esophageal mucosal changes was       5 cm in length. This was biopsied with a cold forceps for histology.      A medium-sized hiatal hernia was present.      Widely patent GE junction      The stomach was normal.      The examined duodenum was normal. Impression:               - Esophageal mucosal changes consistent with                            long-segment Barrett's esophagus. Biopsied.                           - Medium-sized hiatal hernia.                           - Normal stomach.                           - Normal examined duodenum.                           - no evidence of gastric outlet obstruction Moderate Sedation:      See anesthesia note,  no moderate sedation. Recommendation:           - Patient has a contact number available for                            emergencies. The signs and symptoms of potential                            delayed complications were discussed with the                            patient. Return to normal activities tomorrow.                            Written discharge instructions were provided to the                            patient.                           - Resume previous diet.                           - Continue present medications.                           - Return to referring physician at appointment to                            be scheduled. Procedure Code(s):        --- Professional ---                           [id number], Esophagogastroduodenoscopy, flexible,  transoral; with biopsy, single or multiple Diagnosis Code(s):        --- Professional ---                           K22.70, Barrett's esophagus without dysplasia                           R11.2, Nausea with vomiting, unspecified                           K44.9, Diaphragmatic hernia without obstruction or                            gangrene                           R13.10, Dysphagia, unspecified CPT copyright 2016 [employer] Medical Association. All rights reserved. The codes documented in this report are preliminary and upon coder review may  be revised to meet current compliance requirements. Tresea Mall Dr., MD 12/07/2017 9:38:32 AM This report has been signed electronically. Number of Addenda: 0

## 2017-12-10 ENCOUNTER — Encounter (HOSPITAL_COMMUNITY): Payer: Self-pay | Admitting: Gastroenterology

## 2017-12-12 NOTE — Anesthesia Postprocedure Evaluation (Signed)
Anesthesia Post Note  Patient: [name]  Procedure(s) Performed: ESOPHAGOGASTRODUODENOSCOPY (EGD) WITH PROPOFOL (N/A )     Patient location during evaluation: PACU Anesthesia Type: MAC Level of consciousness: awake and alert Pain management: pain level controlled Vital Signs Assessment: post-procedure vital signs reviewed and stable Respiratory status: spontaneous breathing, nonlabored ventilation, respiratory function stable and patient connected to nasal cannula oxygen Cardiovascular status: stable and blood pressure returned to baseline Postop Assessment: no apparent nausea or vomiting Anesthetic complications: no    Last Vitals:  Vitals:   12/07/17 0950 12/07/17 1000  BP: 119/65 114/60  Pulse: (!) 57 (!) 58  Resp: 14 15  Temp:    SpO2: 99% 100%    Last Pain:  Vitals:   12/07/17 1000  TempSrc:   PainSc: 5                  Elzabeth Mcquerry EDWARD

## 2018-01-09 ENCOUNTER — Other Ambulatory Visit (HOSPITAL_COMMUNITY): Payer: Self-pay | Admitting: Psychiatry

## 2018-01-09 ENCOUNTER — Telehealth (HOSPITAL_COMMUNITY): Payer: Self-pay | Admitting: *Deleted

## 2018-01-09 MED ORDER — ALPRAZOLAM 1 MG PO TABS: 1.0000 mg | ORAL_TABLET | Freq: Three times a day (TID) | ORAL | 2 refills | Status: DC

## 2018-01-09 MED ORDER — ZOLPIDEM TARTRATE 10 MG PO TABS: 10.0000 mg | ORAL_TABLET | Freq: Every day | ORAL | 2 refills | Status: DC

## 2018-01-09 NOTE — Telephone Encounter (Signed)
Dr Tenny Crawoss Patient called requesting refills & Rx change . Rx updated & refills on Xanax & Ambien   90 days supply

## 2018-01-09 NOTE — Telephone Encounter (Signed)
sent 

## 2018-01-23 ENCOUNTER — Ambulatory Visit (HOSPITAL_COMMUNITY): Payer: Self-pay | Admitting: Psychiatry

## 2018-02-12 ENCOUNTER — Ambulatory Visit (HOSPITAL_COMMUNITY): Payer: Medicare HMO | Admitting: Psychiatry

## 2018-02-12 ENCOUNTER — Encounter (HOSPITAL_COMMUNITY): Payer: Self-pay | Admitting: Psychiatry

## 2018-02-12 VITALS — BP 103/69 | HR 62 | Ht 66.0 in | Wt 226.0 lb

## 2018-02-12 DIAGNOSIS — F332 Major depressive disorder, recurrent severe without psychotic features: Secondary | ICD-10-CM

## 2018-02-12 DIAGNOSIS — Z811 Family history of alcohol abuse and dependence: Secondary | ICD-10-CM | POA: Diagnosis not present

## 2018-02-12 DIAGNOSIS — Z560 Unemployment, unspecified: Secondary | ICD-10-CM

## 2018-02-12 DIAGNOSIS — R109 Unspecified abdominal pain: Secondary | ICD-10-CM | POA: Diagnosis not present

## 2018-02-12 DIAGNOSIS — M2550 Pain in unspecified joint: Secondary | ICD-10-CM

## 2018-02-12 DIAGNOSIS — R450 Nervousness: Secondary | ICD-10-CM

## 2018-02-12 DIAGNOSIS — M549 Dorsalgia, unspecified: Secondary | ICD-10-CM

## 2018-02-12 DIAGNOSIS — G4700 Insomnia, unspecified: Secondary | ICD-10-CM | POA: Diagnosis not present

## 2018-02-12 DIAGNOSIS — F419 Anxiety disorder, unspecified: Secondary | ICD-10-CM

## 2018-02-12 DIAGNOSIS — Z813 Family history of other psychoactive substance abuse and dependence: Secondary | ICD-10-CM

## 2018-02-12 MED ORDER — DULOXETINE HCL 60 MG PO CPEP: 60.0000 mg | ORAL_CAPSULE | Freq: Every day | ORAL | 2 refills | Status: DC

## 2018-02-12 MED ORDER — ZOLPIDEM TARTRATE 10 MG PO TABS: 10.0000 mg | ORAL_TABLET | Freq: Every day | ORAL | 2 refills | Status: DC

## 2018-02-12 MED ORDER — ALPRAZOLAM 1 MG PO TABS: 1.0000 mg | ORAL_TABLET | Freq: Three times a day (TID) | ORAL | 2 refills | Status: DC
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
## 2018-02-12 NOTE — Progress Notes (Signed)
5.6  

## 2018-05-21 ENCOUNTER — Ambulatory Visit (HOSPITAL_COMMUNITY): Payer: Medicare HMO | Admitting: Psychiatry

## 2018-05-21 ENCOUNTER — Encounter (HOSPITAL_COMMUNITY): Payer: Self-pay | Admitting: Psychiatry

## 2018-05-21 VITALS — BP 122/81 | HR 66 | Ht 66.0 in | Wt 228.0 lb

## 2018-05-21 DIAGNOSIS — F332 Major depressive disorder, recurrent severe without psychotic features: Secondary | ICD-10-CM

## 2018-05-21 MED ORDER — ZOLPIDEM TARTRATE 10 MG PO TABS: 10.0000 mg | ORAL_TABLET | Freq: Every day | ORAL | 2 refills | Status: DC

## 2018-05-21 MED ORDER — DULOXETINE HCL 60 MG PO CPEP: 60.0000 mg | ORAL_CAPSULE | Freq: Every day | ORAL | 2 refills | Status: DC

## 2018-05-21 MED ORDER — ALPRAZOLAM 1 MG PO TABS: 1.0000 mg | ORAL_TABLET | Freq: Three times a day (TID) | ORAL | 2 refills | Status: DC
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
## 2018-06-26 IMAGING — RF DG UGI W/ KUB
12 of 14 series · 12 of 24 positions shown · non-contrast
Comparison: None

CLINICAL DATA: Nissen fundoplication in 6575, with returning
symptoms, epigastric pain, reflux, and nausea/ vomiting.

EXAM:
UPPER GI SERIES WITH KUB
TECHNIQUE: After obtaining a scout radiograph a routine upper GI series was
performed using thin and high density barium.
FLUOROSCOPY TIME:  Fluoroscopy Time:  3.0 minutes
Radiation Exposure Index (if provided by the fluoroscopic device):
64.7 mGy
Number of Acquired Spot Images: 4

[Series 3: cp_standard · 0.35mm/px · 1 of 33 frames shown (1 of 11)]
[frame 5/33]
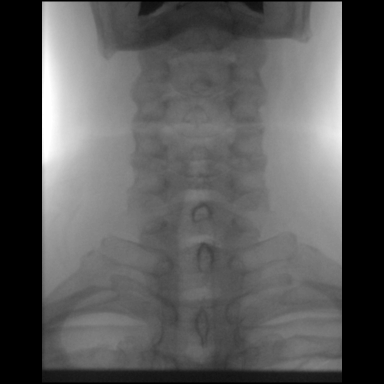

[Series 4: cp_standard · 0.35mm/px · 1 of 47 frames shown (2 of 11)]
[frame 8/47]
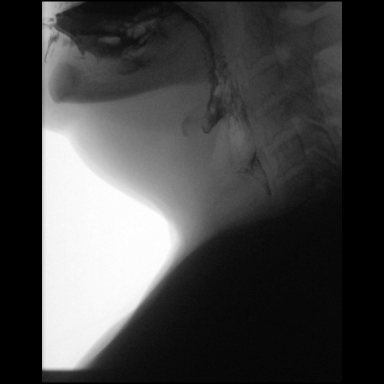

[Series 5: cp_standard · 0.34mm/px · 1 of 49 frames shown (3 of 11)]
[frame 16/49]
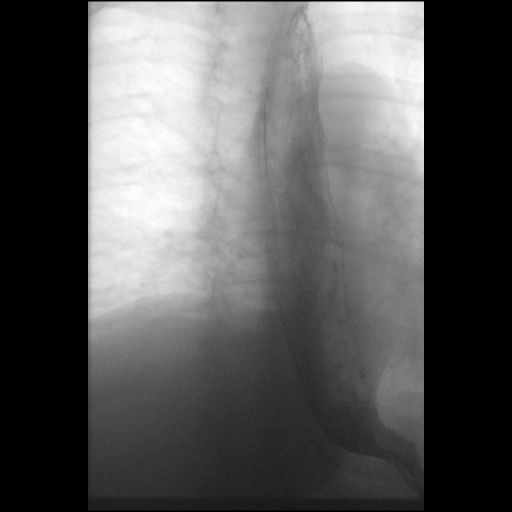

[Series 7: fluoro_barium 2fps_bw · 0.17mm/px · 1 of 2 frames shown]
[frame 1/2]
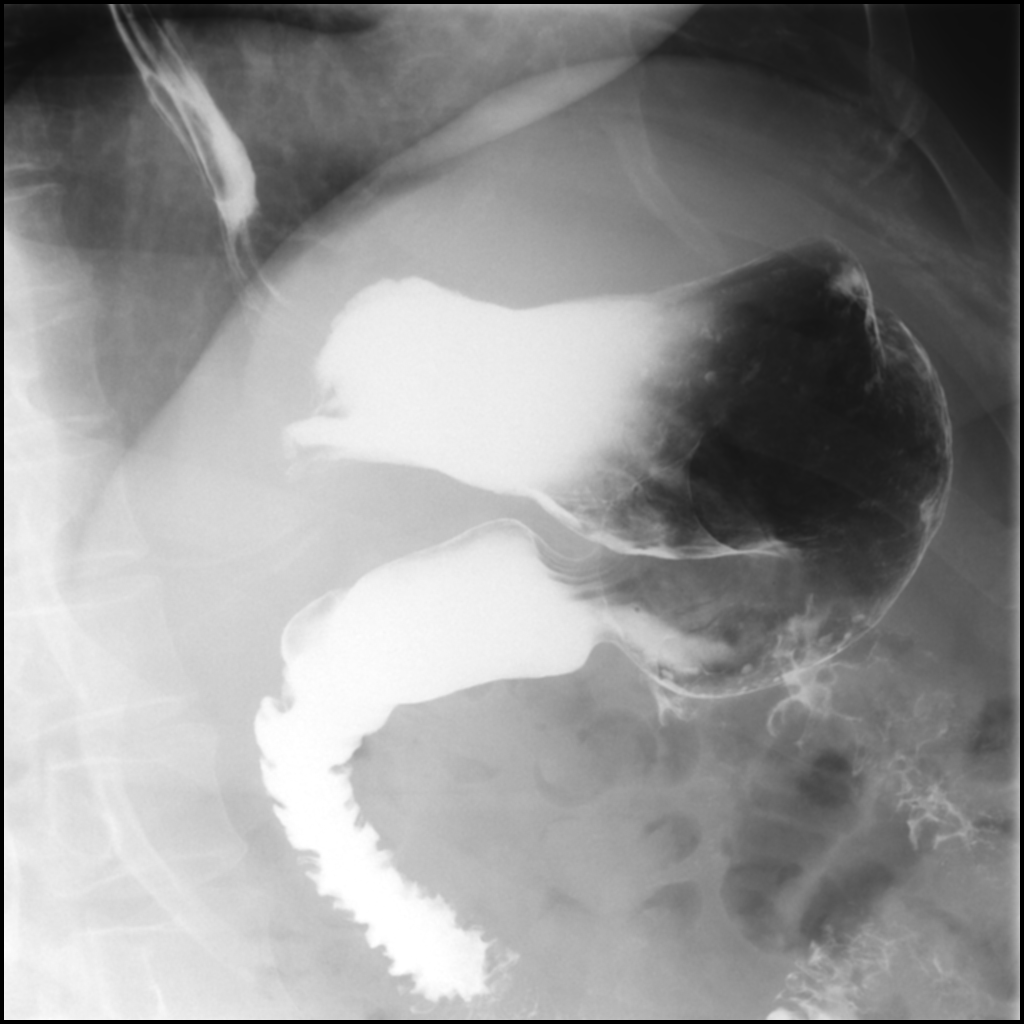

[Series 9: cp_standard · 0.35mm/px · 1 of 40 frames shown (4 of 11)]
[frame 35/40]
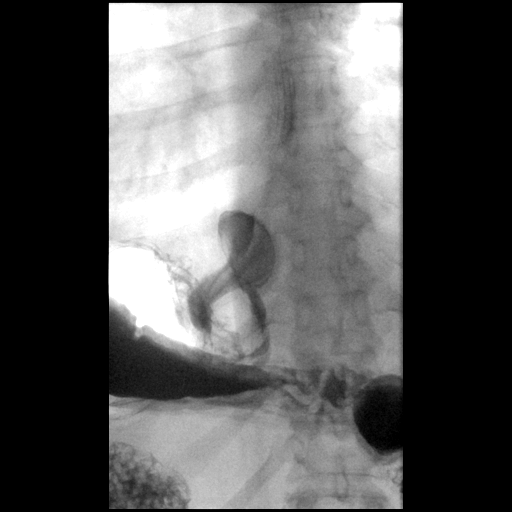

[Series 10: cp_standard · 0.35mm/px · 1 of 35 frames shown (5 of 11)]
[frame 30/35]
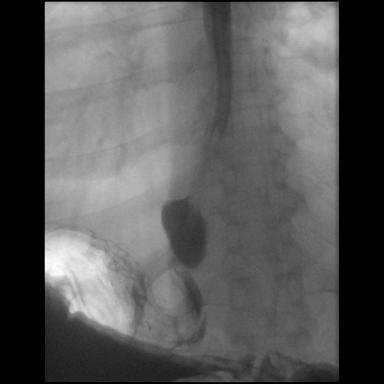

[Series 11: cp_standard · 0.35mm/px · 1 of 41 frames shown (6 of 11)]
[frame 21/41]
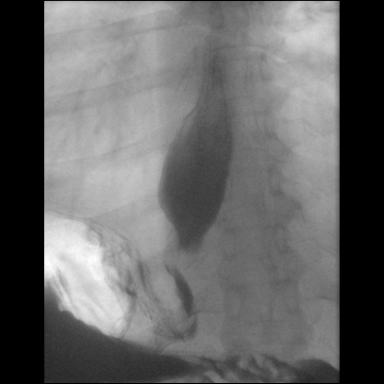

[Series 12: cp_standard · 0.35mm/px · 1 of 45 frames shown (7 of 11)]
[frame 39/45]
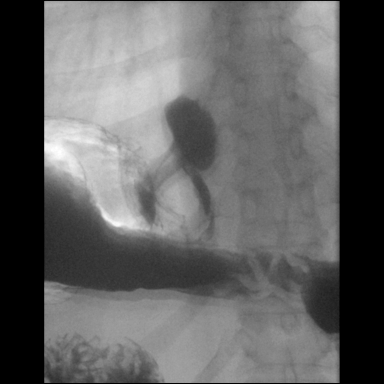

[Series 13: cp_standard · 0.35mm/px · 1 of 45 frames shown (8 of 11)]
[frame 39/45]
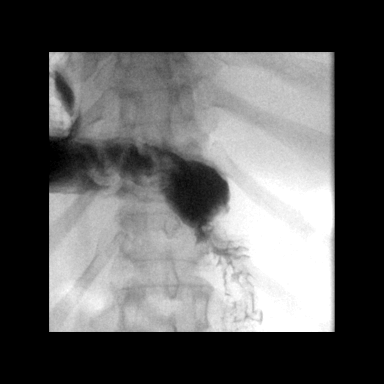

[Series 15: cp_standard · 0.34mm/px · 1 of 43 frames shown (9 of 11)]
[frame 37/43]
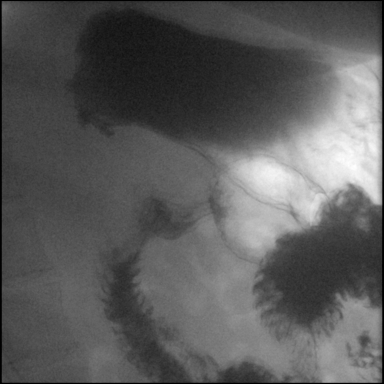

[Series 16: cp_standard · 0.34mm/px · 1 of 31 frames shown (10 of 11)]
[frame 27/31]
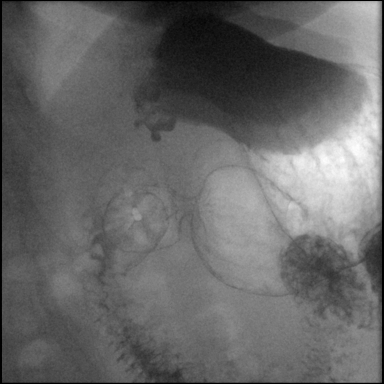

[Series 17: cp_standard · 0.36mm/px · 1 of 10 frames shown (11 of 11)]
[frame 9/10]
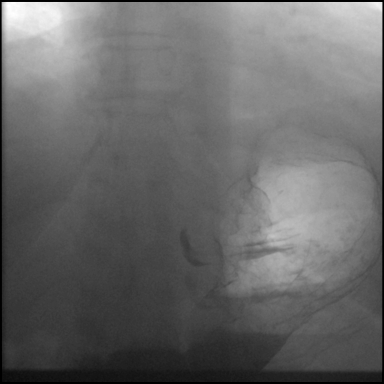

[12 of 24 positions shown; findings below may reference images not displayed]

FINDINGS: Initial KUB demonstrates lower lumbar spondylosis.

The pharyngeal phase of swallowing appears normal.

Primary peristaltic waves in the esophagus are normal on [DATE]
swallows. No stricture or significant narrowing.

We partially filled the fundoplication wrap, for example on image
40/ 9. This is still in place in although it is difficult to exclude
partial on wrapping. Esophageal luminal diameter in the vicinity of
the wrap is about 15 mm.

Mild thickening of antral folds particularly on the mucosal relief
sequences. Normal duodenal morphology. No discrete ulcer identified.

A 13 mm barium tablet passed without difficulty into the stomach.
IMPRESSION: 1. The Nissen fundoplication partially fills with contrast and
appears to for the most part still be in place. The distal esophagus
in the vicinity of the wrap measures up to about 15 mm in diameter
with swallowing. A 13 mm barium tablet passed without difficulty
into the stomach. Difficult to exclude mild partial unwrapping.
2. Thickening of antral folds in the stomach suggesting antritis.

## 2018-08-21 ENCOUNTER — Ambulatory Visit (HOSPITAL_COMMUNITY): Payer: Self-pay | Admitting: Psychiatry

## 2018-09-05 ENCOUNTER — Encounter (HOSPITAL_COMMUNITY): Payer: Self-pay | Admitting: Psychiatry

## 2018-09-05 ENCOUNTER — Ambulatory Visit (HOSPITAL_COMMUNITY): Payer: Medicare HMO | Admitting: Psychiatry

## 2018-09-05 VITALS — BP 119/81 | HR 67 | Ht 66.0 in | Wt 244.0 lb

## 2018-09-05 DIAGNOSIS — F332 Major depressive disorder, recurrent severe without psychotic features: Secondary | ICD-10-CM

## 2018-09-05 DIAGNOSIS — R413 Other amnesia: Secondary | ICD-10-CM | POA: Diagnosis not present

## 2018-09-05 MED ORDER — DULOXETINE HCL 60 MG PO CPEP: 60.0000 mg | ORAL_CAPSULE | Freq: Every day | ORAL | 2 refills | Status: DC

## 2018-09-05 MED ORDER — ZOLPIDEM TARTRATE 10 MG PO TABS: 10.0000 mg | ORAL_TABLET | Freq: Every day | ORAL | 2 refills | Status: DC

## 2018-09-05 MED ORDER — ALPRAZOLAM 1 MG PO TABS: 1.0000 mg | ORAL_TABLET | Freq: Three times a day (TID) | ORAL | 2 refills | Status: DC
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
## 2018-09-14 ENCOUNTER — Other Ambulatory Visit (HOSPITAL_COMMUNITY): Payer: Self-pay | Admitting: Psychiatry

## 2018-12-06 ENCOUNTER — Ambulatory Visit (HOSPITAL_COMMUNITY): Payer: Medicare HMO | Admitting: Psychiatry

## 2018-12-18 ENCOUNTER — Ambulatory Visit (HOSPITAL_COMMUNITY): Payer: Medicare HMO | Admitting: Psychiatry

## 2018-12-18 ENCOUNTER — Encounter (HOSPITAL_COMMUNITY): Payer: Self-pay | Admitting: Psychiatry

## 2018-12-18 VITALS — BP 149/89 | HR 69 | Ht 66.0 in | Wt 250.0 lb

## 2018-12-18 DIAGNOSIS — F332 Major depressive disorder, recurrent severe without psychotic features: Secondary | ICD-10-CM

## 2018-12-18 MED ORDER — DULOXETINE HCL 60 MG PO CPEP: 60.0000 mg | ORAL_CAPSULE | Freq: Every day | ORAL | 2 refills | Status: DC

## 2018-12-18 MED ORDER — ZOLPIDEM TARTRATE 10 MG PO TABS: 10.0000 mg | ORAL_TABLET | Freq: Every day | ORAL | 2 refills | Status: DC

## 2018-12-18 MED ORDER — ALPRAZOLAM 1 MG PO TABS: 1.0000 mg | ORAL_TABLET | Freq: Three times a day (TID) | ORAL | 2 refills | Status: DC
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
## 2018-12-19 ENCOUNTER — Telehealth (HOSPITAL_COMMUNITY): Payer: Self-pay | Admitting: *Deleted

## 2018-12-19 ENCOUNTER — Other Ambulatory Visit (HOSPITAL_COMMUNITY): Payer: Self-pay | Admitting: Psychiatry

## 2018-12-19 MED ORDER — DULOXETINE HCL 60 MG PO CPEP: 60.0000 mg | ORAL_CAPSULE | Freq: Every day | ORAL | 2 refills | Status: DC

## 2018-12-19 MED ORDER — ALPRAZOLAM 1 MG PO TABS: 1.0000 mg | ORAL_TABLET | Freq: Three times a day (TID) | ORAL | 2 refills | Status: DC

## 2018-12-19 MED ORDER — ZOLPIDEM TARTRATE 10 MG PO TABS: 10.0000 mg | ORAL_TABLET | Freq: Every day | ORAL | 2 refills | Status: DC

## 2018-12-19 NOTE — Telephone Encounter (Signed)
Dr Tenny Craw Patient called & said that the med's you discussed with her yesterday during visit does allow 90 day script. She says she hasn't pick up the medication & asked if you would send in for 90 days?

## 2018-12-19 NOTE — Telephone Encounter (Signed)
resent

## 2018-12-31 ENCOUNTER — Other Ambulatory Visit: Payer: Self-pay | Admitting: Gastroenterology

## 2018-12-31 DIAGNOSIS — R112 Nausea with vomiting, unspecified: Secondary | ICD-10-CM

## 2019-01-02 ENCOUNTER — Other Ambulatory Visit: Payer: Self-pay

## 2019-01-03 ENCOUNTER — Other Ambulatory Visit: Payer: Self-pay

## 2019-01-09 ENCOUNTER — Ambulatory Visit
Admission: RE | Admit: 2019-01-09 | Discharge: 2019-01-09 | Disposition: A | Discharge status: Home / Self Care | Payer: Medicare HMO | Source: Ambulatory Visit | Attending: Gastroenterology | Admitting: Gastroenterology

## 2019-01-09 DIAGNOSIS — R112 Nausea with vomiting, unspecified: Secondary | ICD-10-CM

## 2019-01-09 MED ORDER — IOPAMIDOL (ISOVUE-300) INJECTION 61%
100.0000 mL | Freq: Once | INTRAVENOUS | Status: AC | PRN
  Administered 2019-01-09: 100 mL via INTRAVENOUS

## 2019-03-28 ENCOUNTER — Ambulatory Visit (INDEPENDENT_AMBULATORY_CARE_PROVIDER_SITE_OTHER): Payer: Medicare HMO | Admitting: Psychiatry

## 2019-03-28 ENCOUNTER — Encounter (HOSPITAL_COMMUNITY): Payer: Self-pay | Admitting: Psychiatry

## 2019-03-28 DIAGNOSIS — F332 Major depressive disorder, recurrent severe without psychotic features: Secondary | ICD-10-CM | POA: Diagnosis not present

## 2019-03-28 MED ORDER — ALPRAZOLAM 1 MG PO TABS: 1.0000 mg | ORAL_TABLET | Freq: Three times a day (TID) | ORAL | 2 refills | Status: DC

## 2019-03-28 MED ORDER — DULOXETINE HCL 60 MG PO CPEP: 60.0000 mg | ORAL_CAPSULE | Freq: Every day | ORAL | 2 refills | Status: DC

## 2019-03-28 MED ORDER — HYDROXYZINE HCL 50 MG PO TABS: 50.0000 mg | ORAL_TABLET | Freq: Every day | ORAL | 2 refills | Status: DC

## 2019-03-28 MED ORDER — ZOLPIDEM TARTRATE 10 MG PO TABS: 10.0000 mg | ORAL_TABLET | Freq: Every day | ORAL | 2 refills | Status: DC
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
## 2019-04-01 ENCOUNTER — Telehealth (HOSPITAL_COMMUNITY): Payer: Self-pay | Admitting: *Deleted

## 2019-04-01 NOTE — Telephone Encounter (Signed)
    [employer]  NOTICE OF APPROVAL MEDICARE PRESCRIPTION DRUG COVERAGE  HYDROXYZINE HCL  **REFERRAL # [id number]  EFFECTIVE DATE: 11/13/2018 EXPIRATION DATE: 11/14/2019

## 2019-04-16 ENCOUNTER — Telehealth (HOSPITAL_COMMUNITY): Payer: Self-pay | Admitting: *Deleted

## 2019-04-16 NOTE — Telephone Encounter (Signed)
[employer]  APPROVED ZOLPIDEM 10 MG TABLET  (TIER 1)  EFFECTIVE DATE 11/13/2018  THRU 11/14/2019  APPROVAL # [id number]

## 2019-04-23 HISTORY — PX: EYE SURGERY: SHX253

## 2019-04-26 ENCOUNTER — Emergency Department (HOSPITAL_COMMUNITY): Payer: Medicare HMO

## 2019-04-26 ENCOUNTER — Encounter (HOSPITAL_COMMUNITY): Payer: Self-pay | Admitting: Emergency Medicine

## 2019-04-26 ENCOUNTER — Emergency Department (HOSPITAL_COMMUNITY)
Admission: EM | Admit: 2019-04-26 | Discharge: 2019-04-26 | Disposition: A | Discharge status: Home / Self Care | Payer: Medicare HMO | Attending: Emergency Medicine | Admitting: Emergency Medicine

## 2019-04-26 ENCOUNTER — Other Ambulatory Visit: Payer: Self-pay

## 2019-04-26 DIAGNOSIS — E1122 Type 2 diabetes mellitus with diabetic chronic kidney disease: Secondary | ICD-10-CM | POA: Diagnosis not present

## 2019-04-26 DIAGNOSIS — Z79899 Other long term (current) drug therapy: Secondary | ICD-10-CM | POA: Diagnosis not present

## 2019-04-26 DIAGNOSIS — H5461 Unqualified visual loss, right eye, normal vision left eye: Secondary | ICD-10-CM | POA: Diagnosis present

## 2019-04-26 DIAGNOSIS — Z7984 Long term (current) use of oral hypoglycemic drugs: Secondary | ICD-10-CM | POA: Diagnosis not present

## 2019-04-26 DIAGNOSIS — N189 Chronic kidney disease, unspecified: Secondary | ICD-10-CM | POA: Insufficient documentation

## 2019-04-26 DIAGNOSIS — I129 Hypertensive chronic kidney disease with stage 1 through stage 4 chronic kidney disease, or unspecified chronic kidney disease: Secondary | ICD-10-CM | POA: Diagnosis not present

## 2019-04-26 LAB — I-STAT CHEM 8, ED
BUN: 11 mg/dL (ref 6–20)
Calcium, Ion: 1.15 mmol/L (ref 1.15–1.40)
Chloride: 107 mmol/L (ref 98–111)
Creatinine, Ser: 0.9 mg/dL (ref 0.44–1.00)
Glucose, Bld: 119 mg/dL — ABNORMAL HIGH (ref 70–99)
HCT: 36 % (ref 36.0–46.0)
Hemoglobin: 12.2 g/dL (ref 12.0–15.0)
Potassium: 4 mmol/L (ref 3.5–5.1)
Sodium: 142 mmol/L (ref 135–145)
TCO2: 26 mmol/L (ref 22–32)

## 2019-04-26 MED ORDER — LORAZEPAM 2 MG/ML IJ SOLN
1.0000 mg | Freq: Once | INTRAMUSCULAR | Status: AC
  Administered 2019-04-26: 1 mg via INTRAVENOUS
  Filled 2019-04-26: qty 1

## 2019-04-26 MED ORDER — GADOBUTROL 1 MMOL/ML IV SOLN
10.0000 mL | Freq: Once | INTRAVENOUS | Status: AC | PRN
  Administered 2019-04-26: 10 mL via INTRAVENOUS

## 2019-04-26 NOTE — ED Provider Notes (Signed)
[facility] Provider Note   CSN: [id number] Arrival date & time: 04/26/19  1306     History   Chief Complaint Chief Complaint  Patient presents with  . MRI    HPI [name] is a 53 y.o. female.     The history is provided by the patient. No language interpreter was used.  Eye Problem Location:  Right eye Severity:  Moderate Onset quality:  Gradual Timing:  Constant Progression:  Unchanged Chronicity:  New Relieved by:  Nothing Worsened by:  Nothing Ineffective treatments:  None tried Associated symptoms: decreased vision and redness   Pt sent here by Dr. Allena KatzPatel for an MRi of her brain and her eye.    Past Medical History:  Diagnosis Date  . Anemia   . Anxiety   . Arthritis   . Chronic kidney disease    IGA  . Complication of anesthesia   . Depression   . Diabetes mellitus without complication (HCC)    type II   . GERD (gastroesophageal reflux disease)   . Heart murmur    since birth- no need to be concern  . History of hiatal hernia   . History of kidney stones    hx of   . Hypertension   . PONV (postoperative nausea and vomiting)   . Shortness of breath    With exertion  . Sleep apnea    severe sleep apnea uses oxygen 2l at nite     Patient Active Problem List   Diagnosis Date Noted  . Dysphagia 01/16/2017  . Major depression 03/31/2015  . Back pain 10/23/2013    Past Surgical History:  Procedure Laterality Date  . ABDOMINAL HYSTERECTOMY    . BREAST SURGERY Right    biospy x2  . CATARACT EXTRACTION     right eye  . devaited spetum surgery     . DILATION AND CURETTAGE OF UTERUS    . ESOPHAGOGASTRODUODENOSCOPY (EGD) WITH PROPOFOL N/A 12/26/2016   Procedure: ESOPHAGOGASTRODUODENOSCOPY (EGD) WITH PROPOFOL;  Surgeon: Luretha MurphyMatthew Martin, MD;  Location: [facility] ENDOSCOPY;  Service: General;  Laterality: N/A;  . ESOPHAGOGASTRODUODENOSCOPY (EGD) WITH PROPOFOL N/A 12/07/2017   Procedure: ESOPHAGOGASTRODUODENOSCOPY  (EGD) WITH PROPOFOL;  Surgeon: Carman ChingEdwards, James, MD;  [facility];  Service: Endoscopy;  Laterality: N/A;  . EYE SURGERY  04/23/2019   right  . LAPAROSCOPIC NISSEN FUNDOPLICATION N/A 01/16/2017   Procedure: LAPAROSCOPIC ENTEROLYSIS TAKEDOWN OF PRIOR NISSEN FUNDOPLICATION WITH UPPER ENDOSCOPY;  Surgeon: Luretha MurphyMatthew Martin, MD;  Location: [facility] ORS;  Service: General;  Laterality: N/A;  . LUMBAR LAMINECTOMY/DECOMPRESSION MICRODISCECTOMY  10/23/2013   L4 L5   DR Shon BatonBROOKS  . LUMBAR LAMINECTOMY/DECOMPRESSION MICRODISCECTOMY N/A 10/23/2013   Procedure: DECOMPRESSION AND FACET REMOVAL L4 - L5 1 LEVEL;  Surgeon: Venita Lickahari Brooks, MD;  Location: [facility] OR;  Service: Orthopedics;  Laterality: N/A;  . nasal repair collapse      both sides of nose and 2 plates added   . NASAL SINUS SURGERY    . NISSEN FUNDOPLICATION    . right foot surgery     . SEPTOPLASTY  2013  . SHOULDER ARTHROSCOPY WITH ROTATOR CUFF REPAIR Right    x 2  . WRIST SURGERY Left    work injury     OB History   No obstetric history on file.      Home Medications    Prior to Admission medications   Medication Sig Start Date End Date Taking? Authorizing Provider  acetaminophen (TYLENOL) 500 MG  tablet Take 1,000 mg by mouth every 6 (six) hours as needed (pain).     [provider]  albuterol (PROVENTIL HFA;VENTOLIN HFA) 108 (90 Base) MCG/ACT inhaler Inhale 2 puffs into the lungs every 4 (four) hours as needed for wheezing or shortness of breath.    [provider]  allopurinol (ZYLOPRIM) 100 MG tablet Take 100 mg by mouth daily. 03/13/19   [provider]  ALPRAZolam Prudy Feeler(XANAX) 1 MG tablet Take 1 tablet (1 mg total) by mouth 3 (three) times daily. 03/28/19 03/27/20  Myrlene Brokeross, Deborah R, MD  Cholecalciferol (VITAMIN D-3) 5000 units TABS Take 5,000 Units by mouth daily.    [provider]  DULoxetine (CYMBALTA) 60 MG capsule Take 1 capsule (60 mg total) by mouth daily. 03/28/19   Myrlene Brokeross, Deborah R, MD  folic acid  (FOLVITE) 1 MG tablet Take 1 mg by mouth 2 (two) times daily.    [provider]  furosemide (LASIX) 20 MG tablet Take 20 mg by mouth daily.    [provider]  hydrOXYzine (ATARAX/VISTARIL) 50 MG tablet Take 1 tablet (50 mg total) by mouth at bedtime. 03/28/19   Myrlene Brokeross, Deborah R, MD  lisinopril (PRINIVIL,ZESTRIL) 5 MG tablet Take 5 mg by mouth daily.    [provider]  metFORMIN (GLUCOPHAGE) 1000 MG tablet Take 1,000 mg by mouth 2 (two) times daily with a meal.    [provider]  metoCLOPramide (REGLAN) 5 MG tablet Take 5 mg by mouth 4 (four) times daily.    [provider]  metoprolol succinate (TOPROL-XL) 100 MG 24 hr tablet Take 100 mg by mouth daily. Take with or immediately following a meal.    [provider]  ondansetron (ZOFRAN) 4 MG tablet Take 4 mg by mouth every 8 (eight) hours as needed for nausea or vomiting.    [provider]  oxyCODONE-acetaminophen (PERCOCET) 10-325 MG per tablet Take 1 tablet by mouth 2 (two) times daily.     [provider]  OXYGEN Inhale 2 L into the lungs at bedtime.    [provider]  pantoprazole (PROTONIX) 40 MG tablet Take 80 mg by mouth daily.     [provider]  potassium chloride SA (K-DUR,KLOR-CON) 20 MEQ tablet Take 20 mEq by mouth 3 (three) times daily.    [provider]  prednisoLONE acetate (PRED FORTE) 1 % ophthalmic suspension Place 1 drop into the left eye 2 (two) times daily.    [provider]  ranitidine (ZANTAC) 300 MG tablet Take 300 mg by mouth at bedtime.    [provider]  spironolactone-hydrochlorothiazide (ALDACTAZIDE) 25-25 MG tablet Take 1 tablet by mouth daily.    [provider]  vitamin B-12 (CYANOCOBALAMIN) 1000 MCG tablet Take 1,000 mcg by mouth daily.    [provider]  zolpidem (AMBIEN) 10 MG tablet Take 1 tablet (10 mg total) by mouth at bedtime. 03/28/19   Myrlene Brokeross, Deborah R, MD    Family  History Family History  Problem Relation Age of Onset  . Alcohol abuse Brother   . Drug abuse Other   . Stroke Mother   . Hypertension Mother   . Diabetes Mother   . Kidney disease Mother   . Lung cancer Father   . Heart attack Sister   . Lung cancer Brother     Social History Social History   Tobacco Use  . Smoking status: Never Smoker  . Smokeless tobacco: Never Used  Substance Use Topics  .  Alcohol use: No  . Drug use: No     Allergies   Adhesive [tape], Codeine, Macrobid [nitrofurantoin monohyd macro], and Neosporin [neomycin-bacitracin zn-polymyx]   Review of Systems Review of Systems  Eyes: Positive for pain and redness.  All other systems reviewed and are negative.    Physical Exam Updated Vital Signs BP (!) 151/81 (BP Location: Left Arm)   Pulse 72   Temp 98 F (36.7 C) (Oral)   Resp 18   Ht 5\' 6"  (1.676 m)   Wt 111.1 kg   SpO2 100%   BMI 39.54 kg/m   Physical Exam Vitals signs reviewed.  HENT:     Head: Normocephalic.     Nose: Nose normal.     Mouth/Throat:     Mouth: Mucous membranes are moist.  Eyes:     Extraocular Movements: Extraocular movements intact.     Pupils: Pupils are equal, round, and reactive to light.     Comments: Right eye injected,  Normal pupil response   Cardiovascular:     Rate and Rhythm: Normal rate.  Pulmonary:     Effort: Pulmonary effort is normal.  Skin:    General: Skin is warm.  Neurological:     General: No focal deficit present.  Psychiatric:        Mood and Affect: Mood normal.      ED Treatments / Results  Labs (all labs ordered are listed, but only abnormal results are displayed) Labs Reviewed - No data to display  EKG    Radiology No results found.  Procedures Procedures (including critical care time)  Medications Ordered in ED Medications - No data to display   Initial Impression / Assessment and Plan / ED Course  I have reviewed the triage vital signs and the nursing notes.   Pertinent labs & imaging results that were available during my care of the patient were reviewed by me and considered in my medical decision making (see chart for details).        MDM  MRI brain and orbit with and without ordered. Pt given ativan 1mg  Iv before MRI.   Pt's care turned over to Onalee Hua at [facility] pending  Final Clinical Impressions(s) / ED Diagnoses   Final diagnoses:  Visual loss, right eye    ED Discharge Orders    None       Sidney Ace 04/27/19 1208    Daleen Bo, MD 04/29/19 1049

## 2019-04-26 NOTE — ED Triage Notes (Signed)
Pt reports she arrives today for MRI. She had surgery on her right eye on Tuesday and states her vision is not how it used to be. States MD told her to come here for a MRI. Pt also has headache, had this before the procedure.

## 2019-04-26 NOTE — ED Provider Notes (Signed)
[name], please see her note for full details, but in brief [name] is a 53 y.o. female who had a vitrectomy performed by Dr. Allena KatzPatel with ophthalmology 3 days ago, she was seen in the office yesterday but today when leaning forward noticed an acute change in the vision in her right eye.  She was sent to the ED to have MRIs of the brain and orbits completed, we will follow-up on MRI results and discussed with Dr. Allena KatzPatel.  [name]  Addendum Date: 04/26/2019   ADDENDUM REPORT: 04/26/2019 17:09 ADDENDUM: After speaking with the referring physician, it is noted that the patient is status post recent vitrectomy. The finding within the right orbit is magnetic susceptibility effect from an air bubble, rather than a subluxed intra-ocular lens. Electronically Signed   By: Deatra RobinsonKevin  Herman M.D.   On: 04/26/2019 17:09   Result Date: 04/26/2019 CLINICAL DATA:  Recent right eye surgery. Vision disturbance. Headache. EXAM: MRI HEAD AND ORBITS WITHOUT AND WITH [name] TECHNIQUE: Multiplanar, multiecho pulse sequences of the brain and surrounding structures were obtained without and with intravenous [name]. Multiplanar, multiecho pulse sequences of the orbits and surrounding structures were obtained including fat saturation techniques, before and after intravenous [name] administration. [name]:  10 mL Gadavist COMPARISON:  None. FINDINGS: MRI HEAD FINDINGS Brain: The midline structures of the brain are normal. There is no acute ischemia. No intracranial hemorrhage or other extra-axial collection. Specifically, no brainstem lesions. Brain volume is normal for age. There is normal white matter signal. Susceptibility weighted imaging is normal. There is no abnormal [name] enhancement. Vascular: Normal intracranial flow voids. Skull and upper cervical spine: Skull is normal. Upper cervical spine is normal. Other: None MRI ORBITS FINDINGS Orbits: The optic chiasm and both optic  nerves are normal. No abnormality of the intraconal fat. Superior ophthalmic veins are normal. Extraocular muscles are normal and symmetric. There is medial subluxation of the lens of the right globe. Visualized sinuses: Clear. Soft tissues: Negative. IMPRESSION: 1. 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 2. Normal brain. Electronically Signed: By: Deatra RobinsonKevin  Herman M.D. On: 04/26/2019 16:22   [name] Rockwell GermanyOrbits W ZOWo [name]  Addendum Date: 04/26/2019   ADDENDUM REPORT: 04/26/2019 17:09 ADDENDUM: After speaking with the referring physician, it is noted that the patient is status post recent vitrectomy. The finding within the right orbit is magnetic susceptibility effect from an air bubble, rather than a subluxed intra-ocular lens. Electronically Signed   By: Deatra RobinsonKevin  Herman M.D.   On: 04/26/2019 17:09   Result Date: 04/26/2019 CLINICAL DATA:  Recent right eye surgery. Vision disturbance. Headache. EXAM: MRI HEAD AND ORBITS WITHOUT AND WITH [name] TECHNIQUE: Multiplanar, multiecho pulse sequences of the brain and surrounding structures were obtained without and with intravenous [name]. Multiplanar, multiecho pulse sequences of the orbits and surrounding structures were obtained including fat saturation techniques, before and after intravenous [name] administration. [name]:  10 mL Gadavist COMPARISON:  None. FINDINGS: MRI HEAD FINDINGS Brain: The midline structures of the brain are normal. There is no acute ischemia. No intracranial hemorrhage or other extra-axial collection. Specifically, no brainstem lesions. Brain volume is normal for age. There is normal white matter signal. Susceptibility weighted imaging is normal. There is no abnormal [name] enhancement. Vascular: Normal intracranial flow voids. Skull and upper cervical spine: Skull is normal. Upper cervical spine is normal. Other: None MRI ORBITS FINDINGS Orbits: The optic chiasm and both optic nerves are normal. No abnormality of the  intraconal  fat. Superior ophthalmic veins are normal. Extraocular muscles are normal and symmetric. There is medial subluxation of the lens of the right globe. Visualized sinuses: Clear. Soft tissues: Negative. IMPRESSION: 1. 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 2. Normal brain. Electronically Signed: By: Ulyses Jarred M.D. On: 04/26/2019 16:22    MDM MRIs returned which show a right subluxation of the IOL, no other acute abnormalities noted within the orbits and normal brain.  Called and discussed MRI results with Dr. Posey Pronto, he would like to discuss with radiologist he has concern for possible vascular issue he will call back after he has spoken with radiology.  Dr. Posey Pronto has discussed results with radiologist there is no evidence of vascular issue or nerve compression but he is concerned given patient's presentation that she could have some element of compression.  He talked with patient over the phone and he would like for patient to follow-up with [facility], patient did not want to be seen tonight but he will arrange for follow-up with the ophthalmology service at [facility] tomorrow morning.  Guarded prognosis, strict return precautions for any acute changes overnight the patient should seek emergent care, but Dr. Posey Pronto and patient are comfortable with plan for discharge home with close follow-up in the morning.    Jacqlyn Larsen, PA-C 04/26/19 1831    Daleen Bo, MD 04/26/19 2216

## 2019-04-26 NOTE — Discharge Instructions (Signed)
Follow Dr. Serita Grit instructions for follow-up with [facility] tomorrow.  For any big changes in vision, increasing pain or any new changes please return to the emergency department immediately or go to the emergency department at [facility] since Dr . Posey Pronto has discussed with ophthalmologist there.

## 2019-04-27 ENCOUNTER — Emergency Department (HOSPITAL_COMMUNITY)
Admission: EM | Admit: 2019-04-27 | Discharge: 2019-04-27 | Disposition: A | Discharge status: Home / Self Care | Payer: Medicare HMO

## 2019-05-29 ENCOUNTER — Encounter (HOSPITAL_COMMUNITY): Payer: Self-pay | Admitting: Psychiatry

## 2019-05-29 ENCOUNTER — Ambulatory Visit (INDEPENDENT_AMBULATORY_CARE_PROVIDER_SITE_OTHER): Payer: Medicare HMO | Admitting: Psychiatry

## 2019-05-29 ENCOUNTER — Other Ambulatory Visit: Payer: Self-pay

## 2019-05-29 DIAGNOSIS — F332 Major depressive disorder, recurrent severe without psychotic features: Secondary | ICD-10-CM | POA: Diagnosis not present

## 2019-05-29 MED ORDER — ZOLPIDEM TARTRATE 10 MG PO TABS: 10.0000 mg | ORAL_TABLET | Freq: Every day | ORAL | 2 refills | Status: DC

## 2019-05-29 MED ORDER — DULOXETINE HCL 60 MG PO CPEP: 60.0000 mg | ORAL_CAPSULE | Freq: Every day | ORAL | 2 refills | Status: DC

## 2019-05-29 MED ORDER — ALPRAZOLAM 1 MG PO TABS: 1.0000 mg | ORAL_TABLET | Freq: Three times a day (TID) | ORAL | 2 refills | Status: DC

## 2019-05-29 MED ORDER — HYDROXYZINE PAMOATE 100 MG PO CAPS: 100.0000 mg | ORAL_CAPSULE | Freq: Every day | ORAL | 2 refills | Status: DC

## 2019-05-29 NOTE — Progress Notes (Signed)
Virtual Visit via Telephone Note  I connected with [name] ScotPenny L [name] on 05/29/19 at  2:20 PM EDT by telephone and verified that I am speaking with the correct person using two identifiers.   I discussed the limitations, risks, security and privacy concerns of performing an evaluation and management service by telephone and the availability of in person appointments. I also discussed with the patient that there may be a patient responsible charge related to this service. The patient expressed understanding and agreed to proceed.    I discussed the assessment and treatment plan with the patient. The patient was provided an opportunity to ask questions and all were answered. The patient agreed with the plan and demonstrated an understanding of the instructions.   The patient was advised to call back or seek an in-person evaluation if the symptoms worsen or if the condition fails to improve as anticipated.  I provided 15 minutes of non-face-to-face time during this encounter.   Diannia Rudereborah Savana Spina, MD  [facility] MD/PA/NP OP Progress Note  05/29/2019 2:55 PM [name]  MRN:  [id number]  Chief Complaint:  Chief Complaint    Depression; Anxiety; Follow-up     HPI:  this patient is a 53 year-old divorced white female who lives alone in [place]. She has no children and is unemployed. She is applying for disability.  The patient was returned referred by Sharyn Drossonstance Fletcher, her therapist, for further assessment and treatment of depression and anxiety.  The patient is a very poor historian made virtually no eye contact and answered questions in monosyllables. She did relate that she's been depressed for at least 17 years. In the intervening time she has gone through a divorce her sister died in her father died. She's been in therapy most of this time and is either seen psychiatrist or been treated by her primary doctor for depression. She's been on numerous antidepressants but doesn't remember any of  the names except Zoloft. She's currently on a combination of Cymbalta and Wellbutrin which she states isn't working. Xanax is given at night and she states it helps a little bit  The patient returns for follow-up after 3 months.  She has had some visual problems recently as.  She had surgery for floaters but then this followed up with complications and she had loss of vision in her right eye.  She does not have much peripheral vision right now but her vision is slowly coming back.  She is being followed by an ophthalmologist [facility].  She states that when I first added hydroxyzine 50 mg she slept well for about a month but for the last couple of weeks her sleep has been poor again.  She is very socially isolated because several people in her family have been exposed to coronavirus and one had it so she is willing to see any of them right now.  She is talking to her nieces on the phone.  She is still somewhat depressed but but stated she felt much better when she had better sleep.  She denies suicidal ideation.  I suggested that we go to a higher dose of hydroxyzine and she agrees Visit Diagnosis:    ICD-10-CM   1. 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.2     Past Psychiatric History: Long-term outpatient treatment  Past Medical History:  Past Medical History:  Diagnosis Date  . Anemia   . Anxiety   . Arthritis   . Chronic kidney  disease    IGA  . Complication of anesthesia   . Depression   . Diabetes mellitus without complication (HCC)    type II   . GERD (gastroesophageal reflux disease)   . Heart murmur    since birth- no need to be concern  . History of hiatal hernia   . History of kidney stones    hx of   . Hypertension   . PONV (postoperative nausea and vomiting)   . Shortness of breath    With exertion  . Sleep apnea    severe sleep apnea uses oxygen 2l at nite     Past Surgical History:  Procedure Laterality Date   . ABDOMINAL HYSTERECTOMY    . BREAST SURGERY Right    biospy x2  . CATARACT EXTRACTION     right eye  . devaited spetum surgery     . DILATION AND CURETTAGE OF UTERUS    . ESOPHAGOGASTRODUODENOSCOPY (EGD) WITH PROPOFOL N/A 12/26/2016   Procedure: ESOPHAGOGASTRODUODENOSCOPY (EGD) WITH PROPOFOL;  Surgeon: Luretha MurphyMatthew Martin, MD;  Location: [facility] ENDOSCOPY;  Service: General;  Laterality: N/A;  . ESOPHAGOGASTRODUODENOSCOPY (EGD) WITH PROPOFOL N/A 12/07/2017   Procedure: ESOPHAGOGASTRODUODENOSCOPY (EGD) WITH PROPOFOL;  Surgeon: Carman ChingEdwards, James, MD;  [facility];  Service: Endoscopy;  Laterality: N/A;  . EYE SURGERY  04/23/2019   right  . LAPAROSCOPIC NISSEN FUNDOPLICATION N/A 01/16/2017   Procedure: LAPAROSCOPIC ENTEROLYSIS TAKEDOWN OF PRIOR NISSEN FUNDOPLICATION WITH UPPER ENDOSCOPY;  Surgeon: Luretha MurphyMatthew Martin, MD;  Location: [facility] ORS;  Service: General;  Laterality: N/A;  . LUMBAR LAMINECTOMY/DECOMPRESSION MICRODISCECTOMY  10/23/2013   L4 L5   DR Shon BatonBROOKS  . LUMBAR LAMINECTOMY/DECOMPRESSION MICRODISCECTOMY N/A 10/23/2013   Procedure: DECOMPRESSION AND FACET REMOVAL L4 - L5 1 LEVEL;  Surgeon: Venita Lickahari Brooks, MD;  Location: [facility] OR;  Service: Orthopedics;  Laterality: N/A;  . nasal repair collapse      both sides of nose and 2 plates added   . NASAL SINUS SURGERY    . NISSEN FUNDOPLICATION    . right foot surgery     . SEPTOPLASTY  2013  . SHOULDER ARTHROSCOPY WITH ROTATOR CUFF REPAIR Right    x 2  . WRIST SURGERY Left    work injury    Family Psychiatric History: see below  Family History:  Family History  Problem Relation Age of Onset  . Alcohol abuse Brother   . Drug abuse Other   . Stroke Mother   . Hypertension Mother   . Diabetes Mother   . Kidney disease Mother   . Lung cancer Father   . Heart attack Sister   . Lung cancer Brother     Social History:  Social History   Socioeconomic History  . Marital status: Divorced    Spouse name: Not on file  . Number of children:  Not on file  . Years of education: Not on file  . Highest education level: Not on file  Occupational History  . Not on file  Social Needs  . Financial resource strain: Not on file  . Food insecurity    Worry: Not on file    Inability: Not on file  . Transportation needs    Medical: Not on file    Non-medical: Not on file  Tobacco Use  . Smoking status: Never Smoker  . Smokeless tobacco: Never Used  Substance and Sexual Activity  . Alcohol use: No  . Drug use: No  . Sexual activity: Never  Lifestyle  . Physical activity  Days per week: Not on file    Minutes per session: Not on file  . Stress: Not on file  Relationships  . Social Herbalist on phone: Not on file    Gets together: Not on file    Attends religious service: Not on file    Active member of club or organization: Not on file    Attends meetings of clubs or organizations: Not on file    Relationship status: Not on file  Other Topics Concern  . Not on file  Social History Narrative  . Not on file    Allergies:  Allergies  Allergen Reactions  . Adhesive [Tape] Other (See Comments)    Makes skin pull off *Band-aids and Plastic tape" pls use paper tape  . Codeine Hives and Itching  . Macrobid [Nitrofurantoin Monohyd Macro] Hives and Itching  . Neosporin [Neomycin-Bacitracin Zn-Polymyx] Rash    Metabolic Disorder Labs: Lab Results  Component Value Date   HGBA1C 6.4 (H) 11/25/2016   MPG 137 11/25/2016   No results found for: PROLACTIN No results found for: CHOL, TRIG, HDL, CHOLHDL, VLDL, LDLCALC No results found for: TSH  Therapeutic Level Labs: No results found for: LITHIUM No results found for: VALPROATE No components found for:  CBMZ  Current Medications: Current Outpatient Medications  Medication Sig Dispense Refill  . acetaminophen (TYLENOL) 500 MG tablet Take 1,000 mg by mouth every 6 (six) hours as needed (pain).     Marland Kitchen albuterol (PROVENTIL HFA;VENTOLIN HFA) 108 (90 Base)  MCG/ACT inhaler Inhale 2 puffs into the lungs every 4 (four) hours as needed for wheezing or shortness of breath.    . allopurinol (ZYLOPRIM) 100 MG tablet Take 100 mg by mouth daily with breakfast.     . ALPRAZolam (XANAX) 1 MG tablet Take 1 tablet (1 mg total) by mouth 3 (three) times daily. 270 tablet 2  . Cholecalciferol (VITAMIN D-3) 5000 units TABS Take 5,000 Units by mouth daily.    . DULoxetine (CYMBALTA) 60 MG capsule Take 1 capsule (60 mg total) by mouth daily. 90 capsule 2  . folic acid (FOLVITE) 1 MG tablet Take 1 mg by mouth 2 (two) times daily.    . furosemide (LASIX) 20 MG tablet Take 20 mg by mouth daily.    Marland Kitchen glipiZIDE (GLUCOTROL XL) 2.5 MG 24 hr tablet Take 2.5 mg by mouth daily with breakfast.    . hydrOXYzine (VISTARIL) 100 MG capsule Take 1 capsule (100 mg total) by mouth at bedtime. 90 capsule 2  . levocetirizine (XYZAL) 5 MG tablet Take 5 mg by mouth at bedtime.    Marland Kitchen linaclotide (LINZESS) 145 MCG CAPS capsule Take 145 mcg by mouth See admin instructions. Take one tablet (145 mcg) by mouth daily as needed for constipation - 30 minutes before 1st meal of the day - about twice weekly    . metFORMIN (GLUCOPHAGE-XR) 500 MG 24 hr tablet Take 500 mg by mouth daily with supper.    . metoprolol succinate (TOPROL-XL) 100 MG 24 hr tablet Take 100 mg by mouth daily. Take with or immediately following a meal.    . ofloxacin (OCUFLOX) 0.3 % ophthalmic solution Place 1 drop into the right eye See admin instructions. Instill one drop into right eye four times daily for one week (start date 04/24/19 pm)    . ondansetron (ZOFRAN) 4 MG tablet Take 4 mg by mouth every 8 (eight) hours as needed for nausea or vomiting.    . OXYGEN  Inhale 2 L into the lungs at bedtime.    . pantoprazole (PROTONIX) 40 MG tablet Take 80 mg by mouth daily.     Bertram Gala. Polyethyl Glycol-Propyl Glycol (SYSTANE OP) Place 1 drop into both eyes every 2 (two) hours as needed (dry eyes).    . polyethylene glycol (MIRALAX / GLYCOLAX)  17 g packet Take 17 g by mouth daily.    . potassium chloride (K-DUR) 10 MEQ tablet Take 10-20 mEq by mouth See admin instructions. Take two tablets (20 meq) by mouth with breakfast and one tablet (10 meq) with lunch    . prednisoLONE acetate (PRED FORTE) 1 % ophthalmic suspension Place 1 drop into the right eye See admin instructions. Start date 04/24/19 pm: instill one drop into right eye four times daily for one week, then instill one drop into right eye three times daily for one week, then instill one drop into right eye two times daily for one week, then instill one drop into right eye one time daily until gone.    Marland Kitchen. spironolactone-hydrochlorothiazide (ALDACTAZIDE) 25-25 MG tablet Take 1 tablet by mouth daily.    Marland Kitchen. zolpidem (AMBIEN) 10 MG tablet Take 1 tablet (10 mg total) by mouth at bedtime. 90 tablet 2   No current facility-administered medications for this visit.      Musculoskeletal: Strength & Muscle Tone: within normal limits Gait & Station: normal Patient leans: N/A  Psychiatric Specialty Exam: Review of Systems  Constitutional: Positive for malaise/fatigue.  Eyes: Positive for blurred vision.  Psychiatric/Behavioral: Positive for depression. The patient has insomnia.   All other systems reviewed and are negative.   There were no vitals taken for this visit.There is no height or weight on file to calculate BMI.  General Appearance: NA  Eye Contact:  NA  Speech:  Clear and Coherent  Volume:  Normal  Mood:  Anxious  Affect:  NA  Thought Process:  Goal Directed  Orientation:  Full (Time, Place, and Person)  Thought Content: Rumination   Suicidal Thoughts:  No  Homicidal Thoughts:  No  Memory:  Immediate;   Good Recent;   Good Remote;   Good  Judgement:  Good  Insight:  Fair  Psychomotor Activity:  Decreased  Concentration:  Concentration: Fair and Attention Span: Fair  Recall:  Good  Fund of Knowledge: Fair  Language: Good  Akathisia:  No  Handed:  Right  AIMS  (if indicated): not done  Assets:  Communication Skills Desire for Improvement Resilience Social Support Talents/Skills  ADL's:  Intact  Cognition: WNL  Sleep:  Poor   Screenings:   Assessment and Plan: This patient is a 53 year old female with a history of chronic dysthymia low self-esteem depression and insomnia.  She feels much better when she is able to get proper sleep.  Therefore we will increase hydroxyzine from 50 to 100 mg at bedtime.  She will also continue Ambien 10 mg at bedtime for sleep, Xanax 1 mg 3 times daily for anxiety and Cymbalta 60 mg daily for depression.  She has been urged to not sit around so much.  She will return to see me in 3 months   Diannia Rudereborah Wilmer Santillo, MD 05/29/2019, 2:55 PM

## 2019-06-04 ENCOUNTER — Telehealth (HOSPITAL_COMMUNITY): Payer: Self-pay

## 2019-06-04 NOTE — Telephone Encounter (Signed)
[employer] PRESCRIPTION DRUG COVERAGE  APPROVED (FOR LOWER COPAYMENT/TIER 1) HYDROXYZINE 100MG    REFERRAL NUMBER: [id number] EFFECTIVE 11/13/2018 TO 11/14/2019

## 2019-08-13 ENCOUNTER — Encounter (HOSPITAL_COMMUNITY): Payer: Self-pay | Admitting: Psychiatry

## 2019-08-13 ENCOUNTER — Ambulatory Visit (INDEPENDENT_AMBULATORY_CARE_PROVIDER_SITE_OTHER): Payer: Medicare HMO | Admitting: Psychiatry

## 2019-08-13 ENCOUNTER — Other Ambulatory Visit: Payer: Self-pay

## 2019-08-13 DIAGNOSIS — F332 Major depressive disorder, recurrent severe without psychotic features: Secondary | ICD-10-CM | POA: Diagnosis not present

## 2019-08-13 MED ORDER — ALPRAZOLAM 1 MG PO TABS: 1.0000 mg | ORAL_TABLET | Freq: Three times a day (TID) | ORAL | 2 refills | Status: DC

## 2019-08-13 MED ORDER — DULOXETINE HCL 60 MG PO CPEP: 60.0000 mg | ORAL_CAPSULE | Freq: Every day | ORAL | 2 refills | Status: DC

## 2019-08-13 MED ORDER — HYDROXYZINE PAMOATE 100 MG PO CAPS: 100.0000 mg | ORAL_CAPSULE | Freq: Every day | ORAL | 2 refills | Status: DC

## 2019-08-13 MED ORDER — ZOLPIDEM TARTRATE 10 MG PO TABS: 10.0000 mg | ORAL_TABLET | Freq: Every day | ORAL | 2 refills | Status: DC

## 2019-08-13 NOTE — Progress Notes (Signed)
Virtual Visit via Video Note  I connected with [name] on 08/13/19 at 11:20 AM EDT by a video enabled telemedicine application and verified that I am speaking with the correct person using two identifiers.   I discussed the limitations of evaluation and management by telemedicine and the availability of in person appointments. The patient expressed understanding and agreed to proceed.    I discussed the assessment and treatment plan with the patient. The patient was provided an opportunity to ask questions and all were answered. The patient agreed with the plan and demonstrated an understanding of the instructions.   The patient was advised to call back or seek an in-person evaluation if the symptoms worsen or if the condition fails to improve as anticipated.  I provided 15 minutes of non-face-to-face time during this encounter.   [name], MD  [facility] MD/PA/NP OP Progress Note  08/13/2019 11:49 AM [name]  MRN:  [id number]  Chief Complaint:  Chief Complaint    Depression; Anxiety; Follow-up     HPI: this patient is a 53year-old divorced white female who lives alone in [place]. She has no children and is unemployed. She is applying for disability.  The patient was returned referred by Sharyn Dross, her therapist, for further assessment and treatment of depression and anxiety.  The patient is a very poor historian made virtually no eye contact and answered questions in monosyllables. She did relate that she's been depressed for at least 17 years. In the intervening time she has gone through a divorce her sister died in her father died. She's been in therapy most of this time and is either seen psychiatrist or been treated by her primary doctor for depression. She's been on numerous antidepressants but doesn't remember any of the names except Zoloft. She's currently on a combination of Cymbalta and Wellbutrin which she states isn't working. Xanax is given at  night and she states it helps a little bit  The patient returns for follow-up after 2 months.  She is still struggling with loss of vision although some of it is better.  She thinks all of this started when she had a replacement lens put in for cataracts a year or so ago.  She is being followed by an ophthalmologist at [facility] and may need another eye surgery.  She states that overall she is sleeping better with the combination of Ambien and hydroxyzine.  She still not feeling like going out much.  She has peripheral vascular disease and her legs hurt and her shoulder hurts.  She does walk to her mailbox several times a week.  I suggested going up on her antidepressant but she claims she does not really feel it is necessary.  She claims that "everyone's depressed" during the pandemic and she thinks she is doing better than she used to.  She denies any thoughts of self-harm or suicidal ideation.  She is keeping in touch with family through the phone.  Her anxiety is under fair control.  She felt better when she had a dog and I suggested another dog and she is "going to think about it." Visit Diagnosis:    ICD-10-CM   1. 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.2     Past Psychiatric History: Long-term outpatient treatment  Past Medical History:  Past Medical History:  Diagnosis Date  . Anemia   . Anxiety   . Arthritis   . Chronic kidney disease  IGA  . Complication of anesthesia   . Depression   . Diabetes mellitus without complication ([place])    type II   . GERD (gastroesophageal reflux disease)   . Heart murmur    since birth- no need to be concern  . History of hiatal hernia   . History of kidney stones    hx of   . Hypertension   . PONV (postoperative nausea and vomiting)   . Shortness of breath    With exertion  . Sleep apnea    severe sleep apnea uses oxygen 2l at nite     Past Surgical History:  Procedure Laterality Date   . ABDOMINAL HYSTERECTOMY    . BREAST SURGERY Right    biospy x2  . CATARACT EXTRACTION     right eye  . devaited spetum surgery     . DILATION AND CURETTAGE OF UTERUS    . ESOPHAGOGASTRODUODENOSCOPY (EGD) WITH PROPOFOL N/A 12/26/2016   Procedure: ESOPHAGOGASTRODUODENOSCOPY (EGD) WITH PROPOFOL;  Surgeon: Johnathan Hausen, MD;  Location: [facility];  Service: General;  Laterality: N/A;  . ESOPHAGOGASTRODUODENOSCOPY (EGD) WITH PROPOFOL N/A 12/07/2017   Procedure: ESOPHAGOGASTRODUODENOSCOPY (EGD) WITH PROPOFOL;  Surgeon: Laurence Spates, MD;  Location: [place];  Service: Endoscopy;  Laterality: N/A;  . EYE SURGERY  04/23/2019   right  . LAPAROSCOPIC NISSEN FUNDOPLICATION N/A 04/17/8755   Procedure: LAPAROSCOPIC ENTEROLYSIS TAKEDOWN OF PRIOR NISSEN FUNDOPLICATION WITH UPPER ENDOSCOPY;  Surgeon: Johnathan Hausen, MD;  Location: [facility];  Service: General;  Laterality: N/A;  . LUMBAR LAMINECTOMY/DECOMPRESSION MICRODISCECTOMY  10/23/2013   L4 L5   DR Rolena Infante  . LUMBAR LAMINECTOMY/DECOMPRESSION MICRODISCECTOMY N/A 10/23/2013   Procedure: DECOMPRESSION AND FACET REMOVAL L4 - L5 1 LEVEL;  Surgeon: Melina Schools, MD;  Location: [place];  Service: Orthopedics;  Laterality: N/A;  . nasal repair collapse      both sides of nose and 2 plates added   . NASAL SINUS SURGERY    . NISSEN FUNDOPLICATION    . right foot surgery     . SEPTOPLASTY  2013  . SHOULDER ARTHROSCOPY WITH ROTATOR CUFF REPAIR Right    x 2  . WRIST SURGERY Left    work injury    Family Psychiatric History: see below  Family History:  Family History  Problem Relation Age of Onset  . Alcohol abuse Brother   . Drug abuse Other   . Stroke Mother   . Hypertension Mother   . Diabetes Mother   . Kidney disease Mother   . Lung cancer Father   . Heart attack Sister   . Lung cancer Brother     Social History:  Social History   Socioeconomic History  . Marital status: Divorced    Spouse name: Not on file  . Number of children:  Not on file  . Years of education: Not on file  . Highest education level: Not on file  Occupational History  . Not on file  Social Needs  . Financial resource strain: Not on file  . Food insecurity    Worry: Not on file    Inability: Not on file  . Transportation needs    Medical: Not on file    Non-medical: Not on file  Tobacco Use  . Smoking status: Never Smoker  . Smokeless tobacco: Never Used  Substance and Sexual Activity  . Alcohol use: No  . Drug use: No  . Sexual activity: Never  Lifestyle  . Physical activity    Days per  week: Not on file    Minutes per session: Not on file  . Stress: Not on file  Relationships  . Social Musicianconnections    Talks on phone: Not on file    Gets together: Not on file    Attends religious service: Not on file    Active member of club or organization: Not on file    Attends meetings of clubs or organizations: Not on file    Relationship status: Not on file  Other Topics Concern  . Not on file  Social History Narrative  . Not on file    Allergies:  Allergies  Allergen Reactions  . Adhesive [Tape] Other (See Comments)    Makes skin pull off *Band-aids and Plastic tape" pls use paper tape  . Codeine Hives and Itching  . Macrobid [Nitrofurantoin Monohyd Macro] Hives and Itching  . Neosporin [Neomycin-Bacitracin Zn-Polymyx] Rash    Metabolic Disorder Labs: Lab Results  Component Value Date   HGBA1C 6.4 (H) 11/25/2016   MPG 137 11/25/2016   No results found for: PROLACTIN No results found for: CHOL, TRIG, HDL, CHOLHDL, VLDL, LDLCALC No results found for: TSH  Therapeutic Level Labs: No results found for: LITHIUM No results found for: VALPROATE No components found for:  CBMZ  Current Medications: Current Outpatient Medications  Medication Sig Dispense Refill  . acetaminophen (TYLENOL) 500 MG tablet Take 1,000 mg by mouth every 6 (six) hours as needed (pain).     Marland Kitchen. albuterol (PROVENTIL HFA;VENTOLIN HFA) 108 (90 Base)  MCG/ACT inhaler Inhale 2 puffs into the lungs every 4 (four) hours as needed for wheezing or shortness of breath.    . allopurinol (ZYLOPRIM) 100 MG tablet Take 100 mg by mouth daily with breakfast.     . ALPRAZolam (XANAX) 1 MG tablet Take 1 tablet (1 mg total) by mouth 3 (three) times daily. 270 tablet 2  . Cholecalciferol (VITAMIN D-3) 5000 units TABS Take 5,000 Units by mouth daily.    . DULoxetine (CYMBALTA) 60 MG capsule Take 1 capsule (60 mg total) by mouth daily. 90 capsule 2  . folic acid (FOLVITE) 1 MG tablet Take 1 mg by mouth 2 (two) times daily.    . furosemide (LASIX) 20 MG tablet Take 20 mg by mouth daily.    Marland Kitchen. glipiZIDE (GLUCOTROL XL) 2.5 MG 24 hr tablet Take 2.5 mg by mouth daily with breakfast.    . hydrOXYzine (VISTARIL) 100 MG capsule Take 1 capsule (100 mg total) by mouth at bedtime. 90 capsule 2  . levocetirizine (XYZAL) 5 MG tablet Take 5 mg by mouth at bedtime.    Marland Kitchen. linaclotide (LINZESS) 145 MCG CAPS capsule Take 145 mcg by mouth See admin instructions. Take one tablet (145 mcg) by mouth daily as needed for constipation - 30 minutes before 1st meal of the day - about twice weekly    . metFORMIN (GLUCOPHAGE-XR) 500 MG 24 hr tablet Take 500 mg by mouth daily with supper.    . metoprolol succinate (TOPROL-XL) 100 MG 24 hr tablet Take 100 mg by mouth daily. Take with or immediately following a meal.    . ofloxacin (OCUFLOX) 0.3 % ophthalmic solution Place 1 drop into the right eye See admin instructions. Instill one drop into right eye four times daily for one week (start date 04/24/19 pm)    . ondansetron (ZOFRAN) 4 MG tablet Take 4 mg by mouth every 8 (eight) hours as needed for nausea or vomiting.    . OXYGEN Inhale 2  L into the lungs at bedtime.    . pantoprazole (PROTONIX) 40 MG tablet Take 80 mg by mouth daily.     Bertram Gala Glycol-Propyl Glycol (SYSTANE OP) Place 1 drop into both eyes every 2 (two) hours as needed (dry eyes).    . polyethylene glycol (MIRALAX / GLYCOLAX)  17 g packet Take 17 g by mouth daily.    . potassium chloride (K-DUR) 10 MEQ tablet Take 10-20 mEq by mouth See admin instructions. Take two tablets (20 meq) by mouth with breakfast and one tablet (10 meq) with lunch    . prednisoLONE acetate (PRED FORTE) 1 % ophthalmic suspension Place 1 drop into the right eye See admin instructions. Start date 04/24/19 pm: instill one drop into right eye four times daily for one week, then instill one drop into right eye three times daily for one week, then instill one drop into right eye two times daily for one week, then instill one drop into right eye one time daily until gone.    Marland Kitchen spironolactone-hydrochlorothiazide (ALDACTAZIDE) 25-25 MG tablet Take 1 tablet by mouth daily.    Marland Kitchen zolpidem (AMBIEN) 10 MG tablet Take 1 tablet (10 mg total) by mouth at bedtime. 90 tablet 2   No current facility-administered medications for this visit.      Musculoskeletal: Strength & Muscle Tone: within normal limits Gait & Station: normal Patient leans: N/A  Psychiatric Specialty Exam: Review of Systems  Eyes: Positive for blurred vision and double vision.  Psychiatric/Behavioral: The patient is nervous/anxious.   All other systems reviewed and are negative.   There were no vitals taken for this visit.There is no height or weight on file to calculate BMI.  General Appearance: Casual and Fairly Groomed  Eye Contact:  Fair  Speech:  Clear and Coherent  Volume:  Normal  Mood:  Anxious  Affect:  Appropriate and Congruent  Thought Process:  Goal Directed  Orientation:  Full (Time, Place, and Person)  Thought Content: Rumination   Suicidal Thoughts:  No  Homicidal Thoughts:  No  Memory:  Immediate;   Good Recent;   Good Remote;   Fair  Judgement:  Good  Insight:  Fair  Psychomotor Activity:  Decreased  Concentration:  Concentration: Good and Attention Span: Good  Recall:  Good  Fund of Knowledge: Good  Language: Good  Akathisia:  No  Handed:  Right  AIMS  (if indicated): not done  Assets:  Communication Skills Desire for Improvement Resilience Social Support Talents/Skills  ADL's:  Intact  Cognition: WNL  Sleep:  Fair   Screenings:   Assessment and Plan: This patient is a 53 year old female with a history of chronic dysthymia low self-esteem depression and insomnia.  She seems more animated and brighter now that she is getting better sleep.  She will continue hydroxyzine 100 mg at bedtime and Ambien 10 mg at bedtime for sleep, Xanax 1 mg 3 times daily for anxiety and Cymbalta 60 mg daily for depression.  She is again been urged to get outdoors and get fresh air and movement.  She will return to see me in 2 months   [name], MD 08/13/2019, 11:49 AM

## 2019-08-29 ENCOUNTER — Ambulatory Visit (HOSPITAL_COMMUNITY): Payer: Medicare HMO | Admitting: Psychiatry

## 2019-10-15 ENCOUNTER — Other Ambulatory Visit: Payer: Self-pay

## 2019-10-15 ENCOUNTER — Encounter (HOSPITAL_COMMUNITY): Payer: Self-pay | Admitting: Psychiatry

## 2019-10-15 ENCOUNTER — Ambulatory Visit (INDEPENDENT_AMBULATORY_CARE_PROVIDER_SITE_OTHER): Payer: Medicare HMO | Admitting: Psychiatry

## 2019-10-15 DIAGNOSIS — F332 Major depressive disorder, recurrent severe without psychotic features: Secondary | ICD-10-CM

## 2019-10-15 MED ORDER — DULOXETINE HCL 60 MG PO CPEP: 60.0000 mg | ORAL_CAPSULE | Freq: Every day | ORAL | 2 refills | Status: DC

## 2019-10-15 MED ORDER — ALPRAZOLAM 1 MG PO TABS: 1.0000 mg | ORAL_TABLET | Freq: Three times a day (TID) | ORAL | 2 refills | Status: DC

## 2019-10-15 MED ORDER — HYDROXYZINE PAMOATE 100 MG PO CAPS: 100.0000 mg | ORAL_CAPSULE | Freq: Every day | ORAL | 2 refills | Status: DC

## 2019-10-15 MED ORDER — ZOLPIDEM TARTRATE 10 MG PO TABS: 10.0000 mg | ORAL_TABLET | Freq: Every day | ORAL | 2 refills | Status: DC

## 2019-10-15 NOTE — Progress Notes (Signed)
Virtual Visit via Video Note  I connected with [name] on 10/15/19 at  2:00 PM EST by a video enabled telemedicine application and verified that I am speaking with the correct person using two identifiers.   I discussed the limitations of evaluation and management by telemedicine and the availability of in person appointments. The patient expressed understanding and agreed to proceed.    I discussed the assessment and treatment plan with the patient. The patient was provided an opportunity to ask questions and all were answered. The patient agreed with the plan and demonstrated an understanding of the instructions.   The patient was advised to call back or seek an in-person evaluation if the symptoms worsen or if the condition fails to improve as anticipated.  I provided 15 minutes of non-face-to-face time during this encounter.   Levonne Spiller, MD  [facility] MD/PA/NP OP Progress Note  10/15/2019 2:13 PM [name]  MRN:  [id number]  Chief Complaint:  Chief Complaint    Depression; Anxiety; Follow-up     HPI: this patient is a 53year-old divorced white female who lives alone in [place]. She has no children and is unemployed. She is applying for disability.  The patient was returned referred by Alford Highland, her therapist, for further assessment and treatment of depression and anxiety.  The patient is a very poor historian made virtually no eye contact and answered questions in monosyllables. She did relate that she's been depressed for at least 17 years. In the intervening time she has gone through a divorce her sister died in her father died. She's been in therapy most of this time and is either seen psychiatrist or been treated by her primary doctor for depression. She's been on numerous antidepressants but doesn't remember any of the names except Zoloft. She's currently on a combination of Cymbalta and Wellbutrin which she states isn't working. Xanax is given at  night and she states it helps a little bit  The patient returns for follow-up after 2 months.  She states that she is dealing with a lot of health issues.  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  She states that she developed an allergic reaction to some sort of spray that they put on her shoulder and that she has broken out.  Her dermatologist gave her prednisone and triamcinolone cream.  Because she has limitations in her right shoulder she is not able to get the cream on all the affected area.  Between the shoulder pain and the itching she is having a difficult time sleeping.  She is still having some problems with her vision and may need more surgery.  She says that her mood is "okay".  She was able to see her family the Saturday after Thanksgiving and was gratified for this.  She denies any thoughts of self-harm or suicide or severe anxiety.  She still feels her medications are fairly helpful for the depression. Visit Diagnosis:    ICD-10-CM   1. Major depressive disorder, recurrent, severe without psychotic features ([place])  F33.2     Past Psychiatric History: Long-term outpatient treatment  Past Medical History:  Past Medical History:  Diagnosis Date  . Anemia   . Anxiety   . Arthritis   . Chronic kidney disease    IGA  . Complication of anesthesia   . Depression   . Diabetes mellitus without complication ([place])    type II   .  GERD (gastroesophageal reflux disease)   . Heart murmur    since birth- no need to be concern  . History of hiatal hernia   . History of kidney stones    hx of   . Hypertension   . PONV (postoperative nausea and vomiting)   . Shortness of breath    With exertion  . Sleep apnea    severe sleep apnea uses oxygen 2l at nite     Past Surgical History:  Procedure Laterality Date  . ABDOMINAL HYSTERECTOMY    . BREAST SURGERY Right    biospy x2  . CATARACT EXTRACTION     right eye  . devaited spetum  surgery     . DILATION AND CURETTAGE OF UTERUS    . ESOPHAGOGASTRODUODENOSCOPY (EGD) WITH PROPOFOL N/A 12/26/2016   Procedure: ESOPHAGOGASTRODUODENOSCOPY (EGD) WITH PROPOFOL;  Surgeon: Luretha Murphy, MD;  Location: [facility] ENDOSCOPY;  Service: General;  Laterality: N/A;  . ESOPHAGOGASTRODUODENOSCOPY (EGD) WITH PROPOFOL N/A 12/07/2017   Procedure: ESOPHAGOGASTRODUODENOSCOPY (EGD) WITH PROPOFOL;  Surgeon: Carman Ching, MD;  Location[facility] ENDOSCOPY;  Service: Endoscopy;  Laterality: N/A;  . EYE SURGERY  04/23/2019   right  . LAPAROSCOPIC NISSEN FUNDOPLICATION N/A 01/16/2017   Procedure: LAPAROSCOPIC ENTEROLYSIS TAKEDOWN OF PRIOR NISSEN FUNDOPLICATION WITH UPPER ENDOSCOPY;  Surgeon: Luretha Murphy, MD;  Location: [facility] ORS;  Service: General;  Laterality: N/A;  . LUMBAR LAMINECTOMY/DECOMPRESSION MICRODISCECTOMY  10/23/2013   L4 L5   DR Shon Baton  . LUMBAR LAMINECTOMY/DECOMPRESSION MICRODISCECTOMY N/A 10/23/2013   Procedure: DECOMPRESSION AND FACET REMOVAL L4 - L5 1 LEVEL;  Surgeon: Venita Lick, MD;  Location: [facility] OR;  Service: Orthopedics;  Laterality: N/A;  . nasal repair collapse      both sides of nose and 2 plates added   . NASAL SINUS SURGERY    . NISSEN FUNDOPLICATION    . right foot surgery     . SEPTOPLASTY  2013  . SHOULDER ARTHROSCOPY WITH ROTATOR CUFF REPAIR Right    x 2  . WRIST SURGERY Left    work injury    Family Psychiatric History: see below  Family History:  Family History  Problem Relation Age of Onset  . Alcohol abuse Brother   . Drug abuse Other   . Stroke Mother   . Hypertension Mother   . Diabetes Mother   . Kidney disease Mother   . Lung cancer Father   . Heart attack Sister   . Lung cancer Brother     Social History:  Social History   Socioeconomic History  . Marital status: Divorced    Spouse name: Not on file  . Number of children: Not on file  . Years of education: Not on file  . Highest education level: Not on file  Occupational History  . Not on file   Social Needs  . Financial resource strain: Not on file  . Food insecurity    Worry: Not on file    Inability: Not on file  . Transportation needs    Medical: Not on file    Non-medical: Not on file  Tobacco Use  . Smoking status: Never Smoker  . Smokeless tobacco: Never Used  Substance and Sexual Activity  . Alcohol use: No  . Drug use: No  . Sexual activity: Never  Lifestyle  . Physical activity    Days per week: Not on file    Minutes per session: Not on file  . Stress: Not on file  Relationships  . Social connections  Talks on phone: Not on file    Gets together: Not on file    Attends religious service: Not on file    Active member of club or organization: Not on file    Attends meetings of clubs or organizations: Not on file    Relationship status: Not on file  Other Topics Concern  . Not on file  Social History Narrative  . Not on file    Allergies:  Allergies  Allergen Reactions  . Adhesive [Tape] Other (See Comments)    Makes skin pull off *Band-aids and Plastic tape" pls use paper tape  . Codeine Hives and Itching  . Macrobid [Nitrofurantoin Monohyd Macro] Hives and Itching  . Neosporin [Neomycin-Bacitracin Zn-Polymyx] Rash    Metabolic Disorder Labs: Lab Results  Component Value Date   HGBA1C 6.4 (H) 11/25/2016   MPG 137 11/25/2016   No results found for: PROLACTIN No results found for: CHOL, TRIG, HDL, CHOLHDL, VLDL, LDLCALC No results found for: TSH  Therapeutic Level Labs: No results found for: LITHIUM No results found for: VALPROATE No components found for:  CBMZ  Current Medications: Current Outpatient Medications  Medication Sig Dispense Refill  . acetaminophen (TYLENOL) 500 MG tablet Take 1,000 mg by mouth every 6 (six) hours as needed (pain).     Marland Kitchen albuterol (PROVENTIL HFA;VENTOLIN HFA) 108 (90 Base) MCG/ACT inhaler Inhale 2 puffs into the lungs every 4 (four) hours as needed for wheezing or shortness of breath.    . allopurinol  (ZYLOPRIM) 100 MG tablet Take 100 mg by mouth daily with breakfast.     . ALPRAZolam (XANAX) 1 MG tablet Take 1 tablet (1 mg total) by mouth 3 (three) times daily. 270 tablet 2  . Cholecalciferol (VITAMIN D-3) 5000 units TABS Take 5,000 Units by mouth daily.    . DULoxetine (CYMBALTA) 60 MG capsule Take 1 capsule (60 mg total) by mouth daily. 90 capsule 2  . folic acid (FOLVITE) 1 MG tablet Take 1 mg by mouth 2 (two) times daily.    . furosemide (LASIX) 20 MG tablet Take 20 mg by mouth daily.    Marland Kitchen glipiZIDE (GLUCOTROL XL) 2.5 MG 24 hr tablet Take 2.5 mg by mouth daily with breakfast.    . hydrOXYzine (VISTARIL) 100 MG capsule Take 1 capsule (100 mg total) by mouth at bedtime. 90 capsule 2  . levocetirizine (XYZAL) 5 MG tablet Take 5 mg by mouth at bedtime.    Marland Kitchen linaclotide (LINZESS) 145 MCG CAPS capsule Take 145 mcg by mouth See admin instructions. Take one tablet (145 mcg) by mouth daily as needed for constipation - 30 minutes before 1st meal of the day - about twice weekly    . metFORMIN (GLUCOPHAGE-XR) 500 MG 24 hr tablet Take 500 mg by mouth daily with supper.    . metoprolol succinate (TOPROL-XL) 100 MG 24 hr tablet Take 100 mg by mouth daily. Take with or immediately following a meal.    . ofloxacin (OCUFLOX) 0.3 % ophthalmic solution Place 1 drop into the right eye See admin instructions. Instill one drop into right eye four times daily for one week (start date 04/24/19 pm)    . ondansetron (ZOFRAN) 4 MG tablet Take 4 mg by mouth every 8 (eight) hours as needed for nausea or vomiting.    . OXYGEN Inhale 2 L into the lungs at bedtime.    . pantoprazole (PROTONIX) 40 MG tablet Take 80 mg by mouth daily.     Bertram Gala Glycol-Propyl  Glycol (SYSTANE OP) Place 1 drop into both eyes every 2 (two) hours as needed (dry eyes).    . polyethylene glycol (MIRALAX / GLYCOLAX) 17 g packet Take 17 g by mouth daily.    . potassium chloride (K-DUR) 10 MEQ tablet Take 10-20 mEq by mouth See admin  instructions. Take two tablets (20 meq) by mouth with breakfast and one tablet (10 meq) with lunch    . prednisoLONE acetate (PRED FORTE) 1 % ophthalmic suspension Place 1 drop into the right eye See admin instructions. Start date 04/24/19 pm: instill one drop into right eye four times daily for one week, then instill one drop into right eye three times daily for one week, then instill one drop into right eye two times daily for one week, then instill one drop into right eye one time daily until gone.    Marland Kitchen. spironolactone-hydrochlorothiazide (ALDACTAZIDE) 25-25 MG tablet Take 1 tablet by mouth daily.    Marland Kitchen. zolpidem (AMBIEN) 10 MG tablet Take 1 tablet (10 mg total) by mouth at bedtime. 90 tablet 2   No current facility-administered medications for this visit.      Musculoskeletal: Strength & Muscle Tone: within normal limits Gait & Station: normal Patient leans: N/A  Psychiatric Specialty Exam: Review of Systems  Eyes: Positive for blurred vision.  Musculoskeletal: Positive for joint pain.  Skin: Positive for itching and rash.  Psychiatric/Behavioral: The patient is nervous/anxious and has insomnia.   All other systems reviewed and are negative.   There were no vitals taken for this visit.There is no height or weight on file to calculate BMI.  General Appearance: Casual and Fairly Groomed  Eye Contact:  Fair  Speech:  Clear and Coherent  Volume:  Decreased  Mood:  Dysphoric  Affect:  Constricted  Thought Process:  Goal Directed  Orientation:  Full (Time, Place, and Person)  Thought Content: Rumination   Suicidal Thoughts:  No  Homicidal Thoughts:  No  Memory:  Immediate;   Good Recent;   Good Remote;   Fair  Judgement:  Good  Insight:  Fair  Psychomotor Activity:  Decreased  Concentration:  Concentration: Good and Attention Span: Good  Recall:  Good  Fund of Knowledge: Good  Language: Good  Akathisia:  No  Handed:  Right  AIMS (if indicated): not done  Assets:   Communication Skills Desire for Improvement Resilience Social Support Talents/Skills  ADL's:  Intact  Cognition: WNL  Sleep:  Poor   Screenings:   Assessment and Plan: This patient is a 53 year old female with a history of chronic dysthymia depression and insomnia.  She was doing better before the shoulder pain and the allergic reaction occurred.  I do not really want to change her medications while she is taking all these other medicines for the allergic reaction.  She was sleeping better before all this happened so we will continue hydroxyzine 100 mg at bedtime as well as Ambien 10 mg at bedtime for sleep, a Xanax 1 mg 3 times daily for anxiety and Cymbalta 60 mg daily for depression.  She will return to see me in 2 months   Diannia Rudereborah Lemoine Goyne, MD 10/15/2019, 2:13 PM

## 2019-12-09 ENCOUNTER — Telehealth (HOSPITAL_COMMUNITY): Payer: Self-pay | Admitting: *Deleted

## 2019-12-09 NOTE — Telephone Encounter (Signed)
[employer] NOTICE OF APPROVAL OF MEDICARE PRESCRIPTION DRUG COVERAGE  TIER CHANGE REQUEST COVERAGE APPROVED:  HYDROXYZINE PAMOATE CAPSULE  ZOLPIDEM TARTRATE TABLET APPROVAL AUTHORIZES COVERAGE FROM:    11/15/2019  THRU   11/13/2020

## 2019-12-10 ENCOUNTER — Encounter (HOSPITAL_COMMUNITY): Payer: Self-pay | Admitting: Psychiatry

## 2019-12-10 ENCOUNTER — Ambulatory Visit (INDEPENDENT_AMBULATORY_CARE_PROVIDER_SITE_OTHER): Payer: Medicare HMO | Admitting: Psychiatry

## 2019-12-10 ENCOUNTER — Other Ambulatory Visit: Payer: Self-pay

## 2019-12-10 DIAGNOSIS — F332 Major depressive disorder, recurrent severe without psychotic features: Secondary | ICD-10-CM | POA: Diagnosis not present

## 2019-12-10 MED ORDER — ZOLPIDEM TARTRATE 10 MG PO TABS: 10.0000 mg | ORAL_TABLET | Freq: Every day | ORAL | 2 refills | Status: DC

## 2019-12-10 MED ORDER — DULOXETINE HCL 60 MG PO CPEP: 60.0000 mg | ORAL_CAPSULE | Freq: Every day | ORAL | 2 refills | Status: DC

## 2019-12-10 MED ORDER — HYDROXYZINE PAMOATE 100 MG PO CAPS: 100.0000 mg | ORAL_CAPSULE | Freq: Every day | ORAL | 2 refills | Status: DC

## 2019-12-10 MED ORDER — ALPRAZOLAM 1 MG PO TABS: 1.0000 mg | ORAL_TABLET | Freq: Three times a day (TID) | ORAL | 2 refills | Status: DC

## 2019-12-10 NOTE — Progress Notes (Signed)
Virtual Visit via Video Note  I connected with [name] on 12/10/19 at  3:00 PM EST by a video enabled telemedicine application and verified that I am speaking with the correct person using two identifiers.   I discussed the limitations of evaluation and management by telemedicine and the availability of in person appointments. The patient expressed understanding and agreed to proceed.   I discussed the assessment and treatment plan with the patient. The patient was provided an opportunity to ask questions and all were answered. The patient agreed with the plan and demonstrated an understanding of the instructions.   The patient was advised to call back or seek an in-person evaluation if the symptoms worsen or if the condition fails to improve as anticipated.  I provided 15 minutes of non-face-to-face time during this encounter.   [name] Ruder, MD  [facility] MD/PA/NP OP Progress Note  12/10/2019 3:17 PM [name]  MRN:  [id number]  Chief Complaint:  Chief Complaint    Depression; Anxiety; Follow-up     HPI: this patient is a 54year-old divorced white female who lives alone in [place]. She has no children and is unemployed. She is applying for disability.  The patient was returned referred by [name], her therapist, for further assessment and treatment of depression and anxiety.  The patient is a very poor historian made virtually no eye contact and answered questions in monosyllables. She did relate that she's been depressed for at least 17 years. In the intervening time she has gone through a divorce her sister died in her father died. She's been in therapy most of this time and is either seen psychiatrist or been treated by her primary doctor for depression. She's been on numerous antidepressants but doesn't remember any of the names except Zoloft. She's currently on a combination of Cymbalta and Wellbutrin which she states isn't working. Xanax is given at  night and she states it helps a little bit  The patient returns for follow-up after 2 months.  She states she was doing better for a while but the last month or 2 have been more difficult.  Her best friend and the friend's entire family got coronavirus and the friend's mother passed away.  The friend's father was very ill and was hospitalized for a month and her friend is struggling to take care of him.  All of this scared the patient so much that she is canceled all of her in person medical appointments.  She has stopped her physical therapy and her shoulder started to hurt again.  She is doing some of the exercises at home.  She did make an exception and went to her eye doctor and now she has to see another type of specialist for glaucoma.  She still having very poor sight in her right eye and her eyes are painful and itching.  She is also having a lot of back and hip pain but declined to have an injection as she did not want to go into the office.  She is not sleeping all that well but was sleeping fairly well before her friend got sick.  Her friend is better now and hopefully her sleep will come back around.  She has a lot of physical complaints but does seem more open and interactive than she has in quite some time. Visit Diagnosis:    ICD-10-CM   1. 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.2     Past Psychiatric  History: Long-term outpatient treatment  Past Medical History:  Past Medical History:  Diagnosis Date  . Anemia   . Anxiety   . Arthritis   . Chronic kidney disease    IGA  . Complication of anesthesia   . Depression   . Diabetes mellitus without complication ([place])    type II   . GERD (gastroesophageal reflux disease)   . Heart murmur    since birth- no need to be concern  . History of hiatal hernia   . History of kidney stones    hx of   . Hypertension   . PONV (postoperative nausea and vomiting)   . Shortness of breath    With  exertion  . Sleep apnea    severe sleep apnea uses oxygen 2l at nite     Past Surgical History:  Procedure Laterality Date  . ABDOMINAL HYSTERECTOMY    . BREAST SURGERY Right    biospy x2  . CATARACT EXTRACTION     right eye  . devaited spetum surgery     . DILATION AND CURETTAGE OF UTERUS    . ESOPHAGOGASTRODUODENOSCOPY (EGD) WITH PROPOFOL N/A 12/26/2016   Procedure: ESOPHAGOGASTRODUODENOSCOPY (EGD) WITH PROPOFOL;  Surgeon: Johnathan Hausen, MD;  Location: [facility];  Service: General;  Laterality: N/A;  . ESOPHAGOGASTRODUODENOSCOPY (EGD) WITH PROPOFOL N/A 12/07/2017   Procedure: ESOPHAGOGASTRODUODENOSCOPY (EGD) WITH PROPOFOL;  Surgeon: Laurence Spates, MD;  Location: [place];  Service: Endoscopy;  Laterality: N/A;  . EYE SURGERY  04/23/2019   right  . LAPAROSCOPIC NISSEN FUNDOPLICATION N/A 12/22/3149   Procedure: LAPAROSCOPIC ENTEROLYSIS TAKEDOWN OF PRIOR NISSEN FUNDOPLICATION WITH UPPER ENDOSCOPY;  Surgeon: Johnathan Hausen, MD;  Location: [facility];  Service: General;  Laterality: N/A;  . LUMBAR LAMINECTOMY/DECOMPRESSION MICRODISCECTOMY  10/23/2013   L4 L5   DR Rolena Infante  . LUMBAR LAMINECTOMY/DECOMPRESSION MICRODISCECTOMY N/A 10/23/2013   Procedure: DECOMPRESSION AND FACET REMOVAL L4 - L5 1 LEVEL;  Surgeon: Melina Schools, MD;  Location: [facility];  Service: Orthopedics;  Laterality: N/A;  . nasal repair collapse      both sides of nose and 2 plates added   . NASAL SINUS SURGERY    . NISSEN FUNDOPLICATION    . right foot surgery     . SEPTOPLASTY  2013  . SHOULDER ARTHROSCOPY WITH ROTATOR CUFF REPAIR Right    x 2  . WRIST SURGERY Left    work injury    Family Psychiatric History: see below  Family History:  Family History  Problem Relation Age of Onset  . Alcohol abuse Brother   . Drug abuse Other   . Stroke Mother   . Hypertension Mother   . Diabetes Mother   . Kidney disease Mother   . Lung cancer Father   . Heart attack Sister   . Lung cancer Brother     Social  History:  Social History   Socioeconomic History  . Marital status: Divorced    Spouse name: Not on file  . Number of children: Not on file  . Years of education: Not on file  . Highest education level: Not on file  Occupational History  . Not on file  Tobacco Use  . Smoking status: Never Smoker  . Smokeless tobacco: Never Used  Substance and Sexual Activity  . Alcohol use: No  . Drug use: No  . Sexual activity: Never  Other Topics Concern  . Not on file  Social History Narrative  . Not on file   Social Determinants of  Health   Financial Resource Strain:   . Difficulty of Paying Living Expenses: Not on file  Food Insecurity:   . Worried About Programme researcher, broadcasting/film/video in the Last Year: Not on file  . Ran Out of Food in the Last Year: Not on file  Transportation Needs:   . Lack of Transportation (Medical): Not on file  . Lack of Transportation (Non-Medical): Not on file  Physical Activity:   . Days of Exercise per Week: Not on file  . Minutes of Exercise per Session: Not on file  Stress:   . Feeling of Stress : Not on file  Social Connections:   . Frequency of Communication with Friends and Family: Not on file  . Frequency of Social Gatherings with Friends and Family: Not on file  . Attends Religious Services: Not on file  . Active Member of Clubs or Organizations: Not on file  . Attends Banker Meetings: Not on file  . Marital Status: Not on file    Allergies:  Allergies  Allergen Reactions  . Adhesive [Tape] Other (See Comments)    Makes skin pull off *Band-aids and Plastic tape" pls use paper tape  . Codeine Hives and Itching  . Macrobid [Nitrofurantoin Monohyd Macro] Hives and Itching  . Neosporin [Neomycin-Bacitracin Zn-Polymyx] Rash    Metabolic Disorder Labs: Lab Results  Component Value Date   HGBA1C 6.4 (H) 11/25/2016   MPG 137 11/25/2016   No results found for: PROLACTIN No results found for: CHOL, TRIG, HDL, CHOLHDL, VLDL, LDLCALC No  results found for: TSH  Therapeutic Level Labs: No results found for: LITHIUM No results found for: VALPROATE No components found for:  CBMZ  Current Medications: Current Outpatient Medications  Medication Sig Dispense Refill  . acetaminophen (TYLENOL) 500 MG tablet Take 1,000 mg by mouth every 6 (six) hours as needed (pain).     Marland Kitchen albuterol (PROVENTIL HFA;VENTOLIN HFA) 108 (90 Base) MCG/ACT inhaler Inhale 2 puffs into the lungs every 4 (four) hours as needed for wheezing or shortness of breath.    . allopurinol (ZYLOPRIM) 100 MG tablet Take 100 mg by mouth daily with breakfast.     . ALPRAZolam (XANAX) 1 MG tablet Take 1 tablet (1 mg total) by mouth 3 (three) times daily. 270 tablet 2  . Cholecalciferol (VITAMIN D-3) 5000 units TABS Take 5,000 Units by mouth daily.    . DULoxetine (CYMBALTA) 60 MG capsule Take 1 capsule (60 mg total) by mouth daily. 90 capsule 2  . folic acid (FOLVITE) 1 MG tablet Take 1 mg by mouth 2 (two) times daily.    . furosemide (LASIX) 20 MG tablet Take 20 mg by mouth daily.    Marland Kitchen glipiZIDE (GLUCOTROL XL) 2.5 MG 24 hr tablet Take 2.5 mg by mouth daily with breakfast.    . hydrOXYzine (VISTARIL) 100 MG capsule Take 1 capsule (100 mg total) by mouth at bedtime. 90 capsule 2  . insulin lispro (HUMALOG) 100 UNIT/ML injection Inject into the skin.    Marland Kitchen levocetirizine (XYZAL) 5 MG tablet Take 5 mg by mouth at bedtime.    Marland Kitchen linaclotide (LINZESS) 145 MCG CAPS capsule Take 145 mcg by mouth See admin instructions. Take one tablet (145 mcg) by mouth daily as needed for constipation - 30 minutes before 1st meal of the day - about twice weekly    . lisinopril (ZESTRIL) 5 MG tablet Take by mouth.    . metFORMIN (GLUCOPHAGE-XR) 500 MG 24 hr tablet Take 500 mg  by mouth daily with supper.    . metoprolol succinate (TOPROL-XL) 100 MG 24 hr tablet Take 100 mg by mouth daily. Take with or immediately following a meal.    . ofloxacin (OCUFLOX) 0.3 % ophthalmic solution Place 1 drop into  the right eye See admin instructions. Instill one drop into right eye four times daily for one week (start date 04/24/19 pm)    . ondansetron (ZOFRAN) 4 MG tablet Take 4 mg by mouth every 8 (eight) hours as needed for nausea or vomiting.    . OXYGEN Inhale 2 L into the lungs at bedtime.    . pantoprazole (PROTONIX) 40 MG tablet Take 80 mg by mouth daily.     Bertram Gala. Polyethyl Glycol-Propyl Glycol (SYSTANE OP) Place 1 drop into both eyes every 2 (two) hours as needed (dry eyes).    . polyethylene glycol (MIRALAX / GLYCOLAX) 17 g packet Take 17 g by mouth daily.    . potassium chloride (K-DUR) 10 MEQ tablet Take 10-20 mEq by mouth See admin instructions. Take two tablets (20 meq) by mouth with breakfast and one tablet (10 meq) with lunch    . prednisoLONE acetate (PRED FORTE) 1 % ophthalmic suspension Place 1 drop into the right eye See admin instructions. Start date 04/24/19 pm: instill one drop into right eye four times daily for one week, then instill one drop into right eye three times daily for one week, then instill one drop into right eye two times daily for one week, then instill one drop into right eye one time daily until gone.    Marland Kitchen. spironolactone-hydrochlorothiazide (ALDACTAZIDE) 25-25 MG tablet Take 1 tablet by mouth daily.    Marland Kitchen. zolpidem (AMBIEN) 10 MG tablet Take 1 tablet (10 mg total) by mouth at bedtime. 90 tablet 2   No current facility-administered medications for this visit.     Musculoskeletal: Strength & Muscle Tone: within normal limits Gait & Station: normal Patient leans: N/A  Psychiatric Specialty Exam: Review of Systems  Constitutional: Positive for fatigue.  Eyes: Positive for pain and visual disturbance.  Musculoskeletal: Positive for arthralgias and back pain.  Psychiatric/Behavioral: Positive for dysphoric mood and sleep disturbance. The patient is nervous/anxious.   All other systems reviewed and are negative.   There were no vitals taken for this visit.There is no  height or weight on file to calculate BMI.  General Appearance: Casual and Fairly Groomed  Eye Contact:  Fair  Speech:  Clear and Coherent  Volume:  Normal  Mood:  Dysphoric  Affect:  Flat  Thought Process:  Goal Directed  Orientation:  Full (Time, Place, and Person)  Thought Content: Rumination   Suicidal Thoughts:  No  Homicidal Thoughts:  No  Memory:  Immediate;   Good Recent;   Good Remote;   Fair  Judgement:  Fair  Insight:  Fair  Psychomotor Activity:  Decreased  Concentration:  Concentration: Good and Attention Span: Good  Recall:  Good  Fund of Knowledge: Fair  Language: Good  Akathisia:  No  Handed:  Right  AIMS (if indicated): not done  Assets:  Communication Skills Desire for Improvement Resilience Social Support Talents/Skills  ADL's:  Intact  Cognition: WNL  Sleep:  Fair   Screenings:   Assessment and Plan: This patient is a 54 year old female with a history of chronic dysthymia depression and insomnia.  She was doing better for a while so I do not really want to change her medications.  Hopefully with her friend improving she  will become more confident especially after she gets vaccinated for the coronavirus.  She was sleeping better on these current medication so we will continue hydroxyzine 100 mg at bedtime as well as Ambien 10 mg at bedtime for sleep, Xanax 1 mg 3 times daily for anxiety and Cymbalta 60 mg daily for depression.  She will return to see me in 2 months   [name] Ruder, MD 12/10/2019, 3:17 PM

## 2019-12-17 ENCOUNTER — Ambulatory Visit (HOSPITAL_COMMUNITY): Payer: Medicare HMO | Admitting: Psychiatry

## 2020-02-07 ENCOUNTER — Other Ambulatory Visit: Payer: Self-pay

## 2020-02-07 ENCOUNTER — Encounter (HOSPITAL_COMMUNITY): Payer: Self-pay | Admitting: Psychiatry

## 2020-02-07 ENCOUNTER — Ambulatory Visit (INDEPENDENT_AMBULATORY_CARE_PROVIDER_SITE_OTHER): Payer: Medicare HMO | Admitting: Psychiatry

## 2020-02-07 DIAGNOSIS — F332 Major depressive disorder, recurrent severe without psychotic features: Secondary | ICD-10-CM

## 2020-02-07 MED ORDER — DULOXETINE HCL 60 MG PO CPEP: 60.0000 mg | ORAL_CAPSULE | Freq: Every day | ORAL | 2 refills | Status: DC

## 2020-02-07 MED ORDER — ZOLPIDEM TARTRATE 10 MG PO TABS: 10.0000 mg | ORAL_TABLET | Freq: Every day | ORAL | 2 refills | Status: DC

## 2020-02-07 MED ORDER — HYDROXYZINE PAMOATE 100 MG PO CAPS: 100.0000 mg | ORAL_CAPSULE | Freq: Every day | ORAL | 2 refills | Status: DC

## 2020-02-07 MED ORDER — ALPRAZOLAM 1 MG PO TABS: 1.0000 mg | ORAL_TABLET | Freq: Three times a day (TID) | ORAL | 2 refills | Status: DC

## 2020-02-07 NOTE — Progress Notes (Signed)
Virtual Visit via Telephone Note  I connected with [name] on 02/07/20 at 11:20 AM EDT by telephone and verified that I am speaking with the correct person using two identifiers.   I discussed the limitations, risks, security and privacy concerns of performing an evaluation and management service by telephone and the availability of in person appointments. I also discussed with the patient that there may be a patient responsible charge related to this service. The patient expressed understanding and agreed to proceed.   I discussed the assessment and treatment plan with the patient. The patient was provided an opportunity to ask questions and all were answered. The patient agreed with the plan and demonstrated an understanding of the instructions.   The patient was advised to call back or seek an in-person evaluation if the symptoms worsen or if the condition fails to improve as anticipated.  I provided 15  minutes of non-face-to-face time during this encounter.   Levonne Spiller, MD  [facility] MD/PA/NP OP Progress Note  02/07/2020 11:52 AM [name]  MRN:  [id number]  Chief Complaint:  Chief Complaint    Depression; Anxiety; Follow-up     HPI: this patient is a 54year-old divorced white female who lives alone in [place]. She has no children and is unemployed. She is applying for disability.  The patient was returned referred by Alford Highland, her therapist, for further assessment and treatment of depression and anxiety.  The patient is a very poor historian made virtually no eye contact and answered questions in monosyllables. She did relate that she's been depressed for at least 17 years. In the intervening time she has gone through a divorce her sister died in her father died. She's been in therapy most of this time and is either seen psychiatrist or been treated by her primary doctor for depression. She's been on numerous antidepressants but doesn't remember any of  the names except Zoloft. She's currently on a combination of Cymbalta and Wellbutrin which she states isn't working. Xanax is given at night and she states it helps a little bit  The patient returns for follow-up after 2 months.  She states that she has been somewhat more depressed because her best friend stopped talking with her.  Her best friend is gaining weight back with a previous girlfriend and the girlfriend is jealous of the patient even though there was no romantic relationship going on.  She states that she has been close to this friend since they were teenagers.  She has been crying a lot but it has been working with her therapist to try to set appropriate limits because her friend still checks her and tries to visit with her at times and then at times on talk with her.  This is affected her sleep even with the Ambien and hydroxyzine.  She states that she has gotten her first coronavirus vaccine and will get her second at the end of the month.  She states after a few weeks after that she is going to return to church and try to make some new friends.  She does sound more talkative  and positive than she has been in a while Visit Diagnosis:    ICD-10-CM   1. Major depressive disorder, recurrent, severe without psychotic features ([place])  F33.2     Past Psychiatric History: Long-term outpatient treatment  Past Medical History:  Past Medical History:  Diagnosis Date  . Anemia   . Anxiety   . Arthritis   .  Chronic kidney disease    IGA  . Complication of anesthesia   . Depression   . Diabetes mellitus without complication (HCC)    type II   . GERD (gastroesophageal reflux disease)   . Heart murmur    since birth- no need to be concern  . History of hiatal hernia   . History of kidney stones    hx of   . Hypertension   . PONV (postoperative nausea and vomiting)   . Shortness of breath    With exertion  . Sleep apnea    severe sleep apnea uses oxygen 2l at nite     Past Surgical  History:  Procedure Laterality Date  . ABDOMINAL HYSTERECTOMY    . BREAST SURGERY Right    biospy x2  . CATARACT EXTRACTION     right eye  . devaited spetum surgery     . DILATION AND CURETTAGE OF UTERUS    . ESOPHAGOGASTRODUODENOSCOPY (EGD) WITH PROPOFOL N/A 12/26/2016   Procedure: ESOPHAGOGASTRODUODENOSCOPY (EGD) WITH PROPOFOL;  Surgeon: Luretha Murphy, MD;  Location: [facility] ENDOSCOPY;  Service: General;  Laterality: N/A;  . ESOPHAGOGASTRODUODENOSCOPY (EGD) WITH PROPOFOL N/A 12/07/2017   Procedure: ESOPHAGOGASTRODUODENOSCOPY (EGD) WITH PROPOFOL;  Surgeon: Carman Ching, MD;  Location: [facility] ENDOSCOPY;  Service: Endoscopy;  Laterality: N/A;  . EYE SURGERY  04/23/2019   right  . LAPAROSCOPIC NISSEN FUNDOPLICATION N/A 01/16/2017   Procedure: LAPAROSCOPIC ENTEROLYSIS TAKEDOWN OF PRIOR NISSEN FUNDOPLICATION WITH UPPER ENDOSCOPY;  Surgeon: Luretha Murphy, MD;  Location: [facility] ORS;  Service: General;  Laterality: N/A;  . LUMBAR LAMINECTOMY/DECOMPRESSION MICRODISCECTOMY  10/23/2013   L4 L5   DR Shon Baton  . LUMBAR LAMINECTOMY/DECOMPRESSION MICRODISCECTOMY N/A 10/23/2013   Procedure: DECOMPRESSION AND FACET REMOVAL L4 - L5 1 LEVEL;  Surgeon: Venita Lick, MD;  Location: [facility] OR;  Service: Orthopedics;  Laterality: N/A;  . nasal repair collapse      both sides of nose and 2 plates added   . NASAL SINUS SURGERY    . NISSEN FUNDOPLICATION    . right foot surgery     . SEPTOPLASTY  2013  . SHOULDER ARTHROSCOPY WITH ROTATOR CUFF REPAIR Right    x 2  . WRIST SURGERY Left    work injury    Family Psychiatric History: see below  Family History:  Family History  Problem Relation Age of Onset  . Alcohol abuse Brother   . Drug abuse Other   . Stroke Mother   . Hypertension Mother   . Diabetes Mother   . Kidney disease Mother   . Lung cancer Father   . Heart attack Sister   . Lung cancer Brother     Social History:  Social History   Socioeconomic History  . Marital status: Divorced    Spouse name:  Not on file  . Number of children: Not on file  . Years of education: Not on file  . Highest education level: Not on file  Occupational History  . Not on file  Tobacco Use  . Smoking status: Never Smoker  . Smokeless tobacco: Never Used  Substance and Sexual Activity  . Alcohol use: No  . Drug use: No  . Sexual activity: Never  Other Topics Concern  . Not on file  Social History Narrative  . Not on file   Social Determinants of Health   Financial Resource Strain:   . Difficulty of Paying Living Expenses:   Food Insecurity:   . Worried About Programme researcher, broadcasting/film/video in the  Last Year:   . Ran Out of Food in the Last Year:   Transportation Needs:   . Freight forwarder (Medical):   Marland Kitchen Lack of Transportation (Non-Medical):   Physical Activity:   . Days of Exercise per Week:   . Minutes of Exercise per Session:   Stress:   . Feeling of Stress :   Social Connections:   . Frequency of Communication with Friends and Family:   . Frequency of Social Gatherings with Friends and Family:   . Attends Religious Services:   . Active Member of Clubs or Organizations:   . Attends Banker Meetings:   Marland Kitchen Marital Status:     Allergies:  Allergies  Allergen Reactions  . Adhesive [Tape] Other (See Comments)    Makes skin pull off *Band-aids and Plastic tape" pls use paper tape  . Codeine Hives and Itching  . Macrobid [Nitrofurantoin Monohyd Macro] Hives and Itching  . Neosporin [Neomycin-Bacitracin Zn-Polymyx] Rash    Metabolic Disorder Labs: Lab Results  Component Value Date   HGBA1C 6.4 (H) 11/25/2016   MPG 137 11/25/2016   No results found for: PROLACTIN No results found for: CHOL, TRIG, HDL, CHOLHDL, VLDL, LDLCALC No results found for: TSH  Therapeutic Level Labs: No results found for: LITHIUM No results found for: VALPROATE No components found for:  CBMZ  Current Medications: Current Outpatient Medications  Medication Sig Dispense Refill  . acetaminophen  (TYLENOL) 500 MG tablet Take 1,000 mg by mouth every 6 (six) hours as needed (pain).     Marland Kitchen albuterol (PROVENTIL HFA;VENTOLIN HFA) 108 (90 Base) MCG/ACT inhaler Inhale 2 puffs into the lungs every 4 (four) hours as needed for wheezing or shortness of breath.    . allopurinol (ZYLOPRIM) 100 MG tablet Take 100 mg by mouth daily with breakfast.     . ALPRAZolam (XANAX) 1 MG tablet Take 1 tablet (1 mg total) by mouth 3 (three) times daily. 270 tablet 2  . Cholecalciferol (VITAMIN D-3) 5000 units TABS Take 5,000 Units by mouth daily.    . DULoxetine (CYMBALTA) 60 MG capsule Take 1 capsule (60 mg total) by mouth daily. 90 capsule 2  . folic acid (FOLVITE) 1 MG tablet Take 1 mg by mouth 2 (two) times daily.    . furosemide (LASIX) 20 MG tablet Take 20 mg by mouth daily.    Marland Kitchen glipiZIDE (GLUCOTROL XL) 2.5 MG 24 hr tablet Take 2.5 mg by mouth daily with breakfast.    . hydrOXYzine (VISTARIL) 100 MG capsule Take 1 capsule (100 mg total) by mouth at bedtime. 90 capsule 2  . insulin lispro (HUMALOG) 100 UNIT/ML injection Inject into the skin.    Marland Kitchen levocetirizine (XYZAL) 5 MG tablet Take 5 mg by mouth at bedtime.    Marland Kitchen linaclotide (LINZESS) 145 MCG CAPS capsule Take 145 mcg by mouth See admin instructions. Take one tablet (145 mcg) by mouth daily as needed for constipation - 30 minutes before 1st meal of the day - about twice weekly    . lisinopril (ZESTRIL) 5 MG tablet Take by mouth.    . metFORMIN (GLUCOPHAGE-XR) 500 MG 24 hr tablet Take 500 mg by mouth daily with supper.    . metoprolol succinate (TOPROL-XL) 100 MG 24 hr tablet Take 100 mg by mouth daily. Take with or immediately following a meal.    . ofloxacin (OCUFLOX) 0.3 % ophthalmic solution Place 1 drop into the right eye See admin instructions. Instill one drop into right eye four  times daily for one week (start date 04/24/19 pm)    . ondansetron (ZOFRAN) 4 MG tablet Take 4 mg by mouth every 8 (eight) hours as needed for nausea or vomiting.    . OXYGEN  Inhale 2 L into the lungs at bedtime.    . pantoprazole (PROTONIX) 40 MG tablet Take 80 mg by mouth daily.     Bertram Gala Glycol-Propyl Glycol (SYSTANE OP) Place 1 drop into both eyes every 2 (two) hours as needed (dry eyes).    . polyethylene glycol (MIRALAX / GLYCOLAX) 17 g packet Take 17 g by mouth daily.    . potassium chloride (K-DUR) 10 MEQ tablet Take 10-20 mEq by mouth See admin instructions. Take two tablets (20 meq) by mouth with breakfast and one tablet (10 meq) with lunch    . prednisoLONE acetate (PRED FORTE) 1 % ophthalmic suspension Place 1 drop into the right eye See admin instructions. Start date 04/24/19 pm: instill one drop into right eye four times daily for one week, then instill one drop into right eye three times daily for one week, then instill one drop into right eye two times daily for one week, then instill one drop into right eye one time daily until gone.    Marland Kitchen spironolactone-hydrochlorothiazide (ALDACTAZIDE) 25-25 MG tablet Take 1 tablet by mouth daily.    Marland Kitchen zolpidem (AMBIEN) 10 MG tablet Take 1 tablet (10 mg total) by mouth at bedtime. 90 tablet 2   No current facility-administered medications for this visit.     Musculoskeletal: Strength & Muscle Tone: within normal limits Gait & Station: normal Patient leans: N/A  Psychiatric Specialty Exam: Review of Systems  Musculoskeletal: Positive for arthralgias and back pain.  Psychiatric/Behavioral: Positive for dysphoric mood and sleep disturbance.  All other systems reviewed and are negative.   There were no vitals taken for this visit.There is no height or weight on file to calculate BMI.  General Appearance: NA  Eye Contact:  NA  Speech:  Clear and Coherent  Volume:  Normal  Mood: dysphoric  Affect:  NA  Thought Process:  Goal Directed  Orientation:  Full (Time, Place, and Person)  Thought Content: Rumination   Suicidal Thoughts:  No  Homicidal Thoughts:  No  Memory:  Immediate;   Good Recent;    Good Remote;   Good  Judgement:  Fair  Insight:  Fair  Psychomotor Activity:  Decreased  Concentration:  Concentration: Good and Attention Span: Good  Recall:  Good  Fund of Knowledge: Good  Language: Good  Akathisia:  No  Handed:  Right  AIMS (if indicated): not done  Assets:  Communication Skills Desire for Improvement Resilience Social Support Talents/Skills  ADL's:  Intact  Cognition: WNL  Sleep:  Poor   Screenings:   Assessment and Plan: This patient is a 54 year old female with a history of chronic dysthymia depression and insomnia.  She is upset about her friends treatment of her but she seems more resolute and confident than I have seen her in the past.  I think when she makes decisions about the friendship she will be feeling better and her sleep will improve.  For now she will continue on hydroxyzine 100 mg at bedtime as well as Ambien 10 mg at bedtime for sleep, Xanax 1 mg 3 times daily for anxiety and Cymbalta 60 mg daily for depression.  She will return to see me in 3 months   [name], MD 02/07/2020, 11:52 AM

## 2020-04-29 ENCOUNTER — Encounter (HOSPITAL_COMMUNITY): Admission: RE | Admit: 2020-04-29 | Payer: Medicare HMO | Source: Ambulatory Visit

## 2020-04-30 NOTE — Patient Instructions (Addendum)
DUE TO COVID-19 ONLY ONE VISITOR IS ALLOWED TO COME WITH YOU AND STAY IN THE WAITING ROOM ONLY DURING PRE OP AND PROCEDURE DAY OF SURGERY. THE 2 VISITORS  MAY VISIT WITH YOU AFTER SURGERY IN YOUR PRIVATE ROOM DURING VISITING HOURS ONLY!  YOU NEED TO HAVE A COVID 19 TEST ON_6/21/21______ @__12 :25_____, THIS TEST MUST BE DONE BEFORE SURGERY, COME  [place], [place] [facility] , [address].  ([place]) ONCE YOUR COVID TEST IS COMPLETED, PLEASE BEGIN THE QUARANTINE INSTRUCTIONS AS OUTLINED IN YOUR HANDOUT.                [name]    Your procedure is scheduled on: 05/07/20   Report to [facility] Main  Entrance   Report to admitting at 8:00 AM     Call this number if you have problems the morning of surgery [place], NO CHEWING GUM [employer].   Do not eat food After Midnight.   YOU MAY HAVE CLEAR LIQUIDS FROM MIDNIGHT UNTIL 7:00 AM.   At 7:00 AM Please finish the prescribed Pre-Surgery Gatorade drink.   Nothing by mouth after you finish the Gatorade drink !   Take these medicines the morning of surgery with A SIP OF WATER: Cymbalta, Metoprolol, Allopurinol, Protonix,  Use your eye drops, use your inhaler and bring it with you to the hospital.   DO NOT TAKE ANY DIABETIC MEDICATIONS DAY OF YOUR SURGERY       How to Manage Your Diabetes Before and After Surgery  Why is it important to control my blood sugar before and after surgery? . Improving blood sugar levels before and after surgery helps healing and can limit problems. . A way of improving blood sugar control is eating a healthy diet by: o  Eating less sugar and carbohydrates o  Increasing activity/exercise o  Talking with your doctor about reaching your blood sugar goals . High blood sugars (greater than 180 mg/dL) can raise your risk of infections and slow your recovery, so you will need to focus on controlling your  diabetes during the weeks before surgery. . Make sure that the doctor who takes care of your diabetes knows about your planned surgery including the date and location.  How do I manage my blood sugar before surgery? . Check your blood sugar at least 4 times a day, starting 2 days before surgery, to make sure that the level is not too high or low. o Check your blood sugar the morning of your surgery when you wake up and every 2 hours until you get to the Short Stay unit. . If your blood sugar is less than 70 mg/dL, you will need to treat for low blood sugar: o Do not take insulin. o Treat a low blood sugar (less than 70 mg/dL) with  cup of clear juice (cranberry or apple), 4 glucose tablets, OR glucose gel. o Recheck blood sugar in 15 minutes after treatment (to make sure it is greater than 70 mg/dL). If your blood sugar is not greater than 70 mg/dL on recheck, call [phone] for further instructions. . Report your blood sugar to the short stay nurse when you get to Short Stay.  . If you are admitted to the hospital after surgery: o Your blood sugar will be checked by the staff and you will probably be given insulin after surgery (instead of oral diabetes medicines)  to make sure you have good blood sugar levels. o The goal for blood sugar control after surgery is 80-180 mg/dL.   WHAT DO I DO ABOUT MY DIABETES MEDICATION?  Marland Kitchen Do not take oral diabetes medicines (pills) the morning of surgery. .                You may not have any metal on your body including hair pins and              piercings  Do not wear jewelry, make-up, lotions, powders or perfumes, deodorant             Do not wear nail polish on your fingernails.             Do not shave  48 hours prior to surgery.               Do not bring valuables to the hospital. [facility] IS NOT             RESPONSIBLE   FOR VALUABLES.  Contacts, dentures or bridgework may not be worn into surgery.                   Please read  over the following fact sheets you were given: _____________________________________________________________________             [facility] - Preparing for Surgery  Before surgery, you can play an important role.   Because skin is not sterile, your skin needs to be as free of germs as possible.   You can reduce the number of germs on your skin by washing with CHG (chlorahexidine gluconate) soap before surgery.   CHG is an antiseptic cleaner which kills germs and bonds with the skin to continue killing germs even after washing. Please DO NOT use if you have an allergy to CHG or antibacterial soaps.   If your skin becomes reddened/irritated stop using the CHG and inform your nurse when you arrive at Short Stay. Do not shave (including legs and underarms) for at least 48 hours prior to the first CHG shower.   Please follow these instructions carefully:  1.  Shower with CHG Soap the night before surgery and the  morning of Surgery.  2.  If you choose to wash your hair, wash your hair first as usual with your  normal  shampoo.  3.  After you shampoo, rinse your hair and body thoroughly to remove the  shampoo.                                        4.  Use CHG as you would any other liquid soap.  You can apply chg directly  to the skin and wash                       Gently with a scrungie or clean washcloth.  5.  Apply the CHG Soap to your body ONLY FROM THE NECK DOWN.   Do not use on face/ open                           Wound or open sores. Avoid contact with eyes, ears mouth and genitals (private parts).  Wash face,  Genitals (private parts) with your normal soap.             6.  Wash thoroughly, paying special attention to the area where your surgery  will be performed.  7.  Thoroughly rinse your body with warm water from the neck down.  8.  DO NOT shower/wash with your normal soap after using and rinsing off  the CHG Soap.             9.  Pat yourself dry with a clean  towel.            10.  Wear clean pajamas.            11.  Place clean sheets on your bed the night of your first shower and do not  sleep with pets.  Day of Surgery : Do not apply any lotions/deodorants the morning of surgery.  Please wear clean clothes to the hospital/surgery center.  FAILURE TO FOLLOW THESE INSTRUCTIONS MAY RESULT IN THE CANCELLATION OF YOUR SURGERY PATIENT SIGNATURE_________________________________  NURSE SIGNATURE__________________________________  ________________________________________________________________________

## 2020-05-01 ENCOUNTER — Other Ambulatory Visit: Payer: Self-pay

## 2020-05-01 ENCOUNTER — Encounter (HOSPITAL_COMMUNITY): Payer: Self-pay

## 2020-05-01 ENCOUNTER — Encounter (HOSPITAL_COMMUNITY)
Admission: RE | Admit: 2020-05-01 | Discharge: 2020-05-01 | Disposition: A | Discharge status: Home / Self Care | Payer: Medicare HMO | Source: Ambulatory Visit | Attending: Orthopedic Surgery | Admitting: Orthopedic Surgery

## 2020-05-01 DIAGNOSIS — Z01812 Encounter for preprocedural laboratory examination: Secondary | ICD-10-CM | POA: Insufficient documentation

## 2020-05-01 DIAGNOSIS — Z20822 Contact with and (suspected) exposure to covid-19: Secondary | ICD-10-CM | POA: Insufficient documentation

## 2020-05-01 HISTORY — DX: Unspecified asthma, uncomplicated: J45.909

## 2020-05-01 NOTE — Progress Notes (Signed)
COVID Vaccine Completed:Yes Date COVID Vaccine completed:02/12/20 COVID vaccine manufacturer:  Moderna     PCP - Dr. Cherlyn Roberts, Dr. Daryel November Cardiologist -   Chest x-ray - no EKG - requested from [place] Stress Test - no ECHO - no Cardiac Cath -no  Sleep Study - Yes- severe Apnea CPAP - No. Uses home O2  Fasting Blood Sugar - 70-137 Checks Blood Sugar QID_____ times a day  Blood Thinner Instructions:NA Aspirin Instructions: Last Dose:  Anesthesia review:   Patient denies shortness of breath, fever, cough and chest pain at PAT appointment yes   Patient verbalized understanding of instructions that were given to them at the PAT appointment. Patient was also instructed that they will need to review over the PAT instructions again at home before surgery. Yes   Pt reports that she does have SOB with ADL sometimes but doesn't use O2 during the day.

## 2020-05-04 ENCOUNTER — Other Ambulatory Visit: Payer: Self-pay

## 2020-05-04 ENCOUNTER — Encounter (HOSPITAL_COMMUNITY)
Admission: RE | Admit: 2020-05-04 | Discharge: 2020-05-04 | Disposition: A | Discharge status: Home / Self Care | Payer: Medicare HMO | Source: Ambulatory Visit | Attending: Orthopedic Surgery | Admitting: Orthopedic Surgery

## 2020-05-04 ENCOUNTER — Other Ambulatory Visit (HOSPITAL_COMMUNITY)
Admission: RE | Admit: 2020-05-04 | Discharge: 2020-05-04 | Disposition: A | Discharge status: Home / Self Care | Payer: Medicare HMO | Source: Ambulatory Visit | Attending: Orthopedic Surgery | Admitting: Orthopedic Surgery

## 2020-05-04 DIAGNOSIS — Z01812 Encounter for preprocedural laboratory examination: Secondary | ICD-10-CM | POA: Diagnosis not present

## 2020-05-04 LAB — CBC
HCT: 45 % (ref 36.0–46.0)
Hemoglobin: 14.2 g/dL (ref 12.0–15.0)
MCH: 25 pg — ABNORMAL LOW (ref 26.0–34.0)
MCHC: 31.6 g/dL (ref 30.0–36.0)
MCV: 79.1 fL — ABNORMAL LOW (ref 80.0–100.0)
Platelets: 408 10*3/uL — ABNORMAL HIGH (ref 150–400)
RBC: 5.69 MIL/uL — ABNORMAL HIGH (ref 3.87–5.11)
RDW: 15.1 % (ref 11.5–15.5)
WBC: 13.1 10*3/uL — ABNORMAL HIGH (ref 4.0–10.5)
nRBC: 0 % (ref 0.0–0.2)

## 2020-05-04 LAB — BASIC METABOLIC PANEL
Anion gap: 12 (ref 5–15)
BUN: 23 mg/dL — ABNORMAL HIGH (ref 6–20)
CO2: 28 mmol/L (ref 22–32)
Calcium: 9.3 mg/dL (ref 8.9–10.3)
Chloride: 98 mmol/L (ref 98–111)
Creatinine, Ser: 0.98 mg/dL (ref 0.44–1.00)
GFR calc Af Amer: >60 mL/min (ref >60)
GFR calc non Af Amer: >60 mL/min (ref >60)
Glucose, Bld: 142 mg/dL — ABNORMAL HIGH (ref 70–99)
Potassium: 3.3 mmol/L — ABNORMAL LOW (ref 3.5–5.1)
Sodium: 138 mmol/L (ref 135–145)

## 2020-05-04 LAB — SURGICAL PCR SCREEN
MRSA, PCR: POSITIVE — AB
Staphylococcus aureus: POSITIVE — AB

## 2020-05-04 LAB — SARS CORONAVIRUS 2 (TAT 6-24 HRS): SARS Coronavirus 2: NEGATIVE

## 2020-05-04 LAB — GLUCOSE, CAPILLARY: Glucose-Capillary: 138 mg/dL — ABNORMAL HIGH (ref 70–99)

## 2020-05-04 NOTE — Progress Notes (Signed)
PCR done 05/04/20 routed via epic to DR Supple.

## 2020-05-05 LAB — HEMOGLOBIN A1C
Hgb A1c MFr Bld: 6.1 % — ABNORMAL HIGH (ref 4.8–5.6)
Mean Plasma Glucose: 128 mg/dL

## 2020-05-06 MED ORDER — VANCOMYCIN HCL 1500 MG/300ML IV SOLN
1500.0000 mg | Freq: Once | INTRAVENOUS | Status: AC
  Administered 2020-05-07: 1500 mg via INTRAVENOUS
  Filled 2020-05-06: qty 300

## 2020-05-07 ENCOUNTER — Other Ambulatory Visit: Payer: Self-pay

## 2020-05-07 ENCOUNTER — Ambulatory Visit (HOSPITAL_COMMUNITY)
Admission: RE | Admit: 2020-05-07 | Discharge: 2020-05-08 | Disposition: A | Discharge status: Home / Self Care | Payer: Medicare HMO | Source: Ambulatory Visit | Attending: Orthopedic Surgery | Admitting: Orthopedic Surgery

## 2020-05-07 ENCOUNTER — Ambulatory Visit (HOSPITAL_COMMUNITY): Payer: Medicare HMO | Admitting: Psychiatry

## 2020-05-07 ENCOUNTER — Ambulatory Visit (HOSPITAL_COMMUNITY): Payer: Medicare HMO | Admitting: Anesthesiology

## 2020-05-07 ENCOUNTER — Encounter (HOSPITAL_COMMUNITY)
Admission: RE | Disposition: A | Discharge status: Home / Self Care | Payer: Self-pay | Source: Ambulatory Visit | Attending: Orthopedic Surgery

## 2020-05-07 ENCOUNTER — Encounter (HOSPITAL_COMMUNITY): Payer: Self-pay | Admitting: Orthopedic Surgery

## 2020-05-07 DIAGNOSIS — J45909 Unspecified asthma, uncomplicated: Secondary | ICD-10-CM | POA: Insufficient documentation

## 2020-05-07 DIAGNOSIS — Z79899 Other long term (current) drug therapy: Secondary | ICD-10-CM | POA: Diagnosis not present

## 2020-05-07 DIAGNOSIS — M7502 Adhesive capsulitis of left shoulder: Secondary | ICD-10-CM | POA: Insufficient documentation

## 2020-05-07 DIAGNOSIS — G4730 Sleep apnea, unspecified: Secondary | ICD-10-CM | POA: Diagnosis not present

## 2020-05-07 DIAGNOSIS — Z881 Allergy status to other antibiotic agents status: Secondary | ICD-10-CM | POA: Diagnosis not present

## 2020-05-07 DIAGNOSIS — Z8249 Family history of ischemic heart disease and other diseases of the circulatory system: Secondary | ICD-10-CM | POA: Insufficient documentation

## 2020-05-07 DIAGNOSIS — Z9981 Dependence on supplemental oxygen: Secondary | ICD-10-CM | POA: Diagnosis not present

## 2020-05-07 DIAGNOSIS — F419 Anxiety disorder, unspecified: Secondary | ICD-10-CM | POA: Insufficient documentation

## 2020-05-07 DIAGNOSIS — F329 Major depressive disorder, single episode, unspecified: Secondary | ICD-10-CM | POA: Insufficient documentation

## 2020-05-07 DIAGNOSIS — E1122 Type 2 diabetes mellitus with diabetic chronic kidney disease: Secondary | ICD-10-CM | POA: Diagnosis not present

## 2020-05-07 DIAGNOSIS — M19012 Primary osteoarthritis, left shoulder: Secondary | ICD-10-CM | POA: Diagnosis present

## 2020-05-07 DIAGNOSIS — M75102 Unspecified rotator cuff tear or rupture of left shoulder, not specified as traumatic: Secondary | ICD-10-CM | POA: Diagnosis not present

## 2020-05-07 DIAGNOSIS — G8929 Other chronic pain: Secondary | ICD-10-CM | POA: Diagnosis not present

## 2020-05-07 DIAGNOSIS — Z7984 Long term (current) use of oral hypoglycemic drugs: Secondary | ICD-10-CM | POA: Diagnosis not present

## 2020-05-07 DIAGNOSIS — Z841 Family history of disorders of kidney and ureter: Secondary | ICD-10-CM | POA: Diagnosis not present

## 2020-05-07 DIAGNOSIS — Z833 Family history of diabetes mellitus: Secondary | ICD-10-CM | POA: Diagnosis not present

## 2020-05-07 DIAGNOSIS — Z9889 Other specified postprocedural states: Secondary | ICD-10-CM

## 2020-05-07 DIAGNOSIS — Z885 Allergy status to narcotic agent status: Secondary | ICD-10-CM | POA: Diagnosis not present

## 2020-05-07 DIAGNOSIS — Z888 Allergy status to other drugs, medicaments and biological substances status: Secondary | ICD-10-CM | POA: Insufficient documentation

## 2020-05-07 DIAGNOSIS — N189 Chronic kidney disease, unspecified: Secondary | ICD-10-CM | POA: Diagnosis not present

## 2020-05-07 DIAGNOSIS — K219 Gastro-esophageal reflux disease without esophagitis: Secondary | ICD-10-CM | POA: Insufficient documentation

## 2020-05-07 DIAGNOSIS — I129 Hypertensive chronic kidney disease with stage 1 through stage 4 chronic kidney disease, or unspecified chronic kidney disease: Secondary | ICD-10-CM | POA: Diagnosis not present

## 2020-05-07 HISTORY — PX: SHOULDER ARTHROSCOPY WITH ROTATOR CUFF REPAIR: SHX5685

## 2020-05-07 LAB — GLUCOSE, CAPILLARY
Glucose-Capillary: 145 mg/dL — ABNORMAL HIGH (ref 70–99)
Glucose-Capillary: 122 mg/dL — ABNORMAL HIGH (ref 70–99)
Glucose-Capillary: 234 mg/dL — ABNORMAL HIGH (ref 70–99)
Glucose-Capillary: 185 mg/dL — ABNORMAL HIGH (ref 70–99)

## 2020-05-07 SURGERY — ARTHROSCOPY, SHOULDER, WITH ROTATOR CUFF REPAIR
Anesthesia: General | Site: Shoulder | Laterality: Left

## 2020-05-07 MED ORDER — HYDROMORPHONE HCL 1 MG/ML IJ SOLN: 0.5000 mg | INTRAMUSCULAR | Status: DC | PRN

## 2020-05-07 MED ORDER — ONDANSETRON HCL 4 MG/2ML IJ SOLN
INTRAMUSCULAR | Status: DC | PRN
  Administered 2020-05-07: 4 mg via INTRAVENOUS

## 2020-05-07 MED ORDER — ALBUTEROL SULFATE (2.5 MG/3ML) 0.083% IN NEBU: 3.0000 mL | INHALATION_SOLUTION | RESPIRATORY_TRACT | Status: DC | PRN

## 2020-05-07 MED ORDER — MIDAZOLAM HCL 2 MG/2ML IJ SOLN
1.0000 mg | Freq: Once | INTRAMUSCULAR | Status: AC
  Administered 2020-05-07: 2 mg via INTRAVENOUS
  Filled 2020-05-07: qty 2

## 2020-05-07 MED ORDER — PROPOFOL 10 MG/ML IV BOLUS
INTRAVENOUS | Status: DC | PRN
  Administered 2020-05-07: 150 mg via INTRAVENOUS

## 2020-05-07 MED ORDER — ORAL CARE MOUTH RINSE: 15.0000 mL | Freq: Once | OROMUCOSAL | Status: AC

## 2020-05-07 MED ORDER — METOCLOPRAMIDE HCL 5 MG PO TABS
5.0000 mg | ORAL_TABLET | Freq: Three times a day (TID) | ORAL | Status: DC | PRN
  Administered 2020-05-07: 5 mg via ORAL

## 2020-05-07 MED ORDER — FENTANYL CITRATE (PF) 100 MCG/2ML IJ SOLN: INTRAMUSCULAR | Status: DC | PRN

## 2020-05-07 MED ORDER — DULOXETINE HCL 60 MG PO CPEP
60.0000 mg | ORAL_CAPSULE | Freq: Every day | ORAL | Status: DC
  Administered 2020-05-08: 60 mg via ORAL
  Filled 2020-05-07: qty 1

## 2020-05-07 MED ORDER — ALUM & MAG HYDROXIDE-SIMETH 200-200-20 MG/5ML PO SUSP: 30.0000 mL | ORAL | Status: DC | PRN

## 2020-05-07 MED ORDER — FENTANYL CITRATE (PF) 100 MCG/2ML IJ SOLN
25.0000 ug | INTRAMUSCULAR | Status: DC | PRN
  Administered 2020-05-07 (×2): 50 ug via INTRAVENOUS

## 2020-05-07 MED ORDER — OXYCODONE HCL 5 MG PO TABS
10.0000 mg | ORAL_TABLET | ORAL | Status: DC | PRN
  Administered 2020-05-08: 15 mg via ORAL
  Filled 2020-05-07: qty 3

## 2020-05-07 MED ORDER — LIDOCAINE 2% (20 MG/ML) 5 ML SYRINGE
INTRAMUSCULAR | Status: DC | PRN
  Administered 2020-05-07: 70 mg via INTRAVENOUS

## 2020-05-07 MED ORDER — LEVOCETIRIZINE DIHYDROCHLORIDE 5 MG PO TABS: 5.0000 mg | ORAL_TABLET | Freq: Every day | ORAL | Status: DC

## 2020-05-07 MED ORDER — CEFAZOLIN SODIUM-DEXTROSE 2-4 GM/100ML-% IV SOLN
2.0000 g | INTRAVENOUS | Status: AC
  Administered 2020-05-07: 2 g via INTRAVENOUS
  Filled 2020-05-07: qty 100

## 2020-05-07 MED ORDER — DIPHENHYDRAMINE HCL 12.5 MG/5ML PO ELIX: 12.5000 mg | ORAL_SOLUTION | ORAL | Status: DC | PRN

## 2020-05-07 MED ORDER — OXYCODONE HCL 5 MG/5ML PO SOLN: 5.0000 mg | Freq: Once | ORAL | Status: DC | PRN

## 2020-05-07 MED ORDER — CHLORHEXIDINE GLUCONATE 0.12 % MT SOLN
15.0000 mL | Freq: Once | OROMUCOSAL | Status: AC
  Administered 2020-05-07: 15 mL via OROMUCOSAL

## 2020-05-07 MED ORDER — MAGNESIUM CITRATE PO SOLN: 1.0000 | Freq: Once | ORAL | Status: DC | PRN

## 2020-05-07 MED ORDER — METOPROLOL SUCCINATE ER 50 MG PO TB24
100.0000 mg | ORAL_TABLET | Freq: Every day | ORAL | Status: DC
  Administered 2020-05-08: 100 mg via ORAL
  Filled 2020-05-07: qty 2

## 2020-05-07 MED ORDER — ALPRAZOLAM 1 MG PO TABS: 1.0000 mg | ORAL_TABLET | Freq: Three times a day (TID) | ORAL | Status: DC | PRN

## 2020-05-07 MED ORDER — PHENOL 1.4 % MT LIQD: 1.0000 | OROMUCOSAL | Status: DC | PRN

## 2020-05-07 MED ORDER — LORATADINE 10 MG PO TABS
10.0000 mg | ORAL_TABLET | Freq: Every day | ORAL | Status: DC
  Administered 2020-05-07: 10 mg via ORAL
  Filled 2020-05-07: qty 1

## 2020-05-07 MED ORDER — BUPIVACAINE LIPOSOME 1.3 % IJ SUSP
INTRAMUSCULAR | Status: DC | PRN
Start: 2020-05-07 — End: 2020-05-07
  Administered 2020-05-07: 10 mL via PERINEURAL

## 2020-05-07 MED ORDER — HYDROCHLOROTHIAZIDE 25 MG PO TABS
25.0000 mg | ORAL_TABLET | Freq: Every day | ORAL | Status: DC
  Administered 2020-05-07 – 2020-05-08 (×2): 25 mg via ORAL
  Filled 2020-05-07 (×2): qty 1

## 2020-05-07 MED ORDER — OXYCODONE-ACETAMINOPHEN 10-325 MG PO TABS: 1.0000 | ORAL_TABLET | Freq: Four times a day (QID) | ORAL | 0 refills | Status: DC | PRN

## 2020-05-07 MED ORDER — METOCLOPRAMIDE HCL 5 MG/ML IJ SOLN
5.0000 mg | Freq: Three times a day (TID) | INTRAMUSCULAR | Status: DC | PRN
  Filled 2020-05-07: qty 2

## 2020-05-07 MED ORDER — FENTANYL CITRATE (PF) 100 MCG/2ML IJ SOLN
INTRAMUSCULAR | Status: AC
  Filled 2020-05-07: qty 2

## 2020-05-07 MED ORDER — ALLOPURINOL 100 MG PO TABS
100.0000 mg | ORAL_TABLET | Freq: Every day | ORAL | Status: DC
  Administered 2020-05-08: 100 mg via ORAL
  Filled 2020-05-07: qty 1

## 2020-05-07 MED ORDER — SPIRONOLACTONE 25 MG PO TABS
25.0000 mg | ORAL_TABLET | Freq: Every day | ORAL | Status: DC
  Administered 2020-05-07 – 2020-05-08 (×2): 25 mg via ORAL
  Filled 2020-05-07 (×2): qty 1

## 2020-05-07 MED ORDER — CYCLOBENZAPRINE HCL 10 MG PO TABS
10.0000 mg | ORAL_TABLET | Freq: Three times a day (TID) | ORAL | 1 refills | Status: DC | PRN
Start: 2020-05-07 — End: 2020-08-17

## 2020-05-07 MED ORDER — METHOCARBAMOL 500 MG IVPB - SIMPLE MED
500.0000 mg | Freq: Four times a day (QID) | INTRAVENOUS | Status: DC | PRN
  Filled 2020-05-07: qty 50

## 2020-05-07 MED ORDER — PANTOPRAZOLE SODIUM 40 MG PO TBEC
40.0000 mg | DELAYED_RELEASE_TABLET | Freq: Every day | ORAL | Status: DC
  Administered 2020-05-07 – 2020-05-08 (×2): 40 mg via ORAL

## 2020-05-07 MED ORDER — ACETAMINOPHEN 325 MG PO TABS
325.0000 mg | ORAL_TABLET | Freq: Four times a day (QID) | ORAL | Status: DC | PRN
  Administered 2020-05-08: 650 mg via ORAL
  Filled 2020-05-07: qty 2

## 2020-05-07 MED ORDER — DOCUSATE SODIUM 100 MG PO CAPS
100.0000 mg | ORAL_CAPSULE | Freq: Two times a day (BID) | ORAL | Status: DC
  Filled 2020-05-07: qty 1

## 2020-05-07 MED ORDER — LACTATED RINGERS IV SOLN: INTRAVENOUS | Status: DC

## 2020-05-07 MED ORDER — OXYCODONE HCL 5 MG PO TABS: 5.0000 mg | ORAL_TABLET | Freq: Once | ORAL | Status: DC | PRN

## 2020-05-07 MED ORDER — BISACODYL 5 MG PO TBEC: 5.0000 mg | DELAYED_RELEASE_TABLET | Freq: Every day | ORAL | Status: DC | PRN

## 2020-05-07 MED ORDER — MENTHOL 3 MG MT LOZG: 1.0000 | LOZENGE | OROMUCOSAL | Status: DC | PRN

## 2020-05-07 MED ORDER — DEXAMETHASONE SODIUM PHOSPHATE 10 MG/ML IJ SOLN
INTRAMUSCULAR | Status: DC | PRN
  Administered 2020-05-07: 6 mg via INTRAVENOUS

## 2020-05-07 MED ORDER — METHOCARBAMOL 500 MG PO TABS
500.0000 mg | ORAL_TABLET | Freq: Four times a day (QID) | ORAL | Status: DC | PRN
  Administered 2020-05-08 (×2): 500 mg via ORAL
  Filled 2020-05-07 (×2): qty 1

## 2020-05-07 MED ORDER — SUGAMMADEX SODIUM 200 MG/2ML IV SOLN
INTRAVENOUS | Status: DC | PRN
  Administered 2020-05-07: 300 mg via INTRAVENOUS

## 2020-05-07 MED ORDER — INSULIN ASPART 100 UNIT/ML ~~LOC~~ SOLN
0.0000 [IU] | Freq: Three times a day (TID) | SUBCUTANEOUS | Status: DC
  Administered 2020-05-07: 5 [IU] via SUBCUTANEOUS

## 2020-05-07 MED ORDER — PANTOPRAZOLE SODIUM 40 MG PO TBEC
40.0000 mg | DELAYED_RELEASE_TABLET | Freq: Two times a day (BID) | ORAL | Status: DC
  Administered 2020-05-07: 40 mg via ORAL
  Filled 2020-05-07 (×3): qty 1

## 2020-05-07 MED ORDER — PROMETHAZINE HCL 25 MG/ML IJ SOLN
INTRAMUSCULAR | Status: AC
  Administered 2020-05-07: 6.25 mg via INTRAVENOUS
  Filled 2020-05-07: qty 1

## 2020-05-07 MED ORDER — SODIUM CHLORIDE 0.9 % IR SOLN
Status: DC | PRN
  Administered 2020-05-07: 3000 mL
  Administered 2020-05-07: 6000 mL
  Administered 2020-05-07: 3000 mL

## 2020-05-07 MED ORDER — FUROSEMIDE 20 MG PO TABS
20.0000 mg | ORAL_TABLET | Freq: Every day | ORAL | Status: DC
  Administered 2020-05-07 – 2020-05-08 (×2): 20 mg via ORAL
  Filled 2020-05-07 (×2): qty 1

## 2020-05-07 MED ORDER — PROMETHAZINE HCL 25 MG/ML IJ SOLN: 6.2500 mg | INTRAMUSCULAR | Status: DC | PRN

## 2020-05-07 MED ORDER — ROCURONIUM BROMIDE 10 MG/ML (PF) SYRINGE
PREFILLED_SYRINGE | INTRAVENOUS | Status: DC | PRN
  Administered 2020-05-07: 10 mg via INTRAVENOUS
  Administered 2020-05-07: 50 mg via INTRAVENOUS

## 2020-05-07 MED ORDER — ONDANSETRON HCL 4 MG PO TABS: 4.0000 mg | ORAL_TABLET | Freq: Four times a day (QID) | ORAL | Status: DC | PRN

## 2020-05-07 MED ORDER — METHOCARBAMOL 500 MG IVPB - SIMPLE MED
INTRAVENOUS | Status: AC
  Filled 2020-05-07: qty 50

## 2020-05-07 MED ORDER — SPIRONOLACTONE-HCTZ 25-25 MG PO TABS
1.0000 | ORAL_TABLET | Freq: Every day | ORAL | Status: DC
  Filled 2020-05-07 (×2): qty 1

## 2020-05-07 MED ORDER — POLYETHYLENE GLYCOL 3350 17 G PO PACK: 17.0000 g | PACK | Freq: Every day | ORAL | Status: DC | PRN

## 2020-05-07 MED ORDER — BUPIVACAINE HCL (PF) 0.5 % IJ SOLN
INTRAMUSCULAR | Status: DC | PRN
  Administered 2020-05-07: 15 mL via PERINEURAL

## 2020-05-07 MED ORDER — ONDANSETRON HCL 4 MG/2ML IJ SOLN: 4.0000 mg | Freq: Four times a day (QID) | INTRAMUSCULAR | Status: DC | PRN

## 2020-05-07 MED ORDER — OXYCODONE HCL 5 MG PO TABS
5.0000 mg | ORAL_TABLET | ORAL | Status: DC | PRN
  Administered 2020-05-07 – 2020-05-08 (×2): 10 mg via ORAL
  Filled 2020-05-07 (×2): qty 2

## 2020-05-07 MED ORDER — ZOLPIDEM TARTRATE 5 MG PO TABS
5.0000 mg | ORAL_TABLET | Freq: Every day | ORAL | Status: DC
  Administered 2020-05-07: 5 mg via ORAL
  Filled 2020-05-07: qty 1

## 2020-05-07 MED ORDER — FENTANYL CITRATE (PF) 100 MCG/2ML IJ SOLN
50.0000 ug | Freq: Once | INTRAMUSCULAR | Status: AC
  Administered 2020-05-07: 50 ug via INTRAVENOUS
  Filled 2020-05-07: qty 2

## 2020-05-07 MED ORDER — POTASSIUM CHLORIDE CRYS ER 10 MEQ PO TBCR
10.0000 meq | EXTENDED_RELEASE_TABLET | Freq: Three times a day (TID) | ORAL | Status: DC
  Administered 2020-05-07 – 2020-05-08 (×3): 10 meq via ORAL
  Filled 2020-05-07 (×3): qty 1

## 2020-05-07 MED ORDER — HYDROXYZINE HCL 50 MG PO TABS
100.0000 mg | ORAL_TABLET | Freq: Every day | ORAL | Status: DC
  Administered 2020-05-07: 100 mg via ORAL
  Filled 2020-05-07: qty 2

## 2020-05-07 SURGICAL SUPPLY — 56 items
ANCHOR PEEK 4.75X19.1 SWLK C (Anchor) ×6 IMPLANT
BLADE EXCALIBUR 4.0X13 (MISCELLANEOUS) ×2 IMPLANT
BOOTIES KNEE HIGH SLOAN (MISCELLANEOUS) ×4 IMPLANT
BURR OVAL 8 FLU 4.0X13 (MISCELLANEOUS) ×2 IMPLANT
BURR OVAL 8 FLU 5.0X13 (MISCELLANEOUS) ×2 IMPLANT
CANNULA ACUFLEX KIT 5X76 (CANNULA) ×2 IMPLANT
CANNULA DRILOCK 5.0X75 (CANNULA) IMPLANT
CANNULA TWIST IN 8.25X7CM (CANNULA) IMPLANT
CLSR STERI-STRIP ANTIMIC 1/2X4 (GAUZE/BANDAGES/DRESSINGS) ×2 IMPLANT
CONNECTOR 5 IN 1 STRAIGHT STRL (MISCELLANEOUS) ×2 IMPLANT
COOLER ICEMAN CLASSIC (MISCELLANEOUS) IMPLANT
COVER WAND RF STERILE (DRAPES) ×2 IMPLANT
DISSECTOR  3.8MM X 13CM (MISCELLANEOUS) ×1
DISSECTOR 3.8MM X 13CM (MISCELLANEOUS) ×1 IMPLANT
DRAPE INCISE 23X17 IOBAN STRL (DRAPES) ×1
DRAPE INCISE IOBAN 23X17 STRL (DRAPES) ×1 IMPLANT
DRAPE INCISE IOBAN 66X45 STRL (DRAPES) ×2 IMPLANT
DRAPE ORTHO SPLIT 77X108 STRL (DRAPES) ×2
DRAPE STERI 35X30 U-POUCH (DRAPES) ×2 IMPLANT
DRAPE SURG 17X11 SM STRL (DRAPES) ×2 IMPLANT
DRAPE SURG ORHT 6 SPLT 77X108 (DRAPES) ×2 IMPLANT
DRAPE U-SHAPE 47X51 STRL (DRAPES) ×2 IMPLANT
DRSG PAD ABDOMINAL 8X10 ST (GAUZE/BANDAGES/DRESSINGS) ×2 IMPLANT
DURAPREP 26ML APPLICATOR (WOUND CARE) ×2 IMPLANT
GAUZE SPONGE 4X4 12PLY STRL (GAUZE/BANDAGES/DRESSINGS) ×2 IMPLANT
GLOVE BIO SURGEON STRL SZ7.5 (GLOVE) ×2 IMPLANT
GLOVE BIO SURGEON STRL SZ8 (GLOVE) ×2 IMPLANT
GLOVE SS BIOGEL STRL SZ 7 (GLOVE) ×2 IMPLANT
GLOVE SS BIOGEL STRL SZ 7.5 (GLOVE) ×1 IMPLANT
GLOVE SUPERSENSE BIOGEL SZ 7 (GLOVE) ×2
GLOVE SUPERSENSE BIOGEL SZ 7.5 (GLOVE) ×1
GOWN STRL REUS W/TWL LRG LVL3 (GOWN DISPOSABLE) ×4 IMPLANT
KIT BASIN (CUSTOM PROCEDURE TRAY) ×2 IMPLANT
KIT SHOULDER TRACTION (DRAPES) ×2 IMPLANT
KIT TURNOVER KIT A (KITS) IMPLANT
MANIFOLD NEPTUNE II (INSTRUMENTS) ×2 IMPLANT
NEEDLE SCORPION MULTI FIRE (NEEDLE) IMPLANT
NS IRRIG 1000ML POUR BTL (IV SOLUTION) ×2 IMPLANT
PACK DSU ARTHROSCOPY (CUSTOM PROCEDURE TRAY) ×2 IMPLANT
PAD ARMBOARD 7.5X6 YLW CONV (MISCELLANEOUS) ×2 IMPLANT
PAD COLD SHLDR WRAP-ON (PAD) IMPLANT
PROBE APOLLO 90XL (SURGICAL WAND) ×2 IMPLANT
SLING ARM FOAM STRAP MED (SOFTGOODS) IMPLANT
SLING ULTRA III MED (ORTHOPEDIC SUPPLIES) ×2 IMPLANT
SPONGE LAP 4X18 RFD (DISPOSABLE) ×2 IMPLANT
STRIP CLOSURE SKIN 1/2X4 (GAUZE/BANDAGES/DRESSINGS) ×2 IMPLANT
SUT FIBERWIRE #2 38 T-5 BLUE (SUTURE)
SUT MNCRL AB 3-0 PS2 18 (SUTURE) ×2 IMPLANT
SUT PDS AB 0 CT 36 (SUTURE) IMPLANT
SUT TIGER TAPE 7 IN WHITE (SUTURE) ×6 IMPLANT
SUTURE FIBERWR #2 38 T-5 BLUE (SUTURE) IMPLANT
TAPE FIBER 2MM 7IN #2 BLUE (SUTURE) IMPLANT
TAPE PAPER 3X10 WHT MICROPORE (GAUZE/BANDAGES/DRESSINGS) ×2 IMPLANT
TOWEL OR 17X26 10 PK STRL BLUE (TOWEL DISPOSABLE) ×2 IMPLANT
TUBING ARTHROSCOPY IRRIG 16FT (MISCELLANEOUS) ×2 IMPLANT
WATER STERILE IRR 1000ML POUR (IV SOLUTION) ×2 IMPLANT

## 2020-05-07 NOTE — Transfer of Care (Signed)
Immediate Anesthesia Transfer of Care Note  Patient: PERSAIS ETHRIDGE  Procedure(s) Performed: Left shoulder arthroscopy, subacromial decompression, distal clavicle resection, rotator cuff repair (Left Shoulder)  Patient Location: PACU  Anesthesia Type:General  Level of Consciousness: awake, alert  and oriented  Airway & Oxygen Therapy: Patient Spontanous Breathing and Patient connected to face mask oxygen  Post-op Assessment: Report given to RN and Post -op Vital signs reviewed and stable  Post vital signs: Reviewed and stable  Last Vitals:  Vitals Value Taken Time  BP 115/66 05/07/20 1131  Temp    Pulse 64 05/07/20 1132  Resp 18 05/07/20 1132  SpO2 96 % 05/07/20 1132  Vitals shown include unvalidated device data.  Last Pain:  Vitals:   05/07/20 0910  TempSrc:   PainSc: 2          Complications: No complications documented.

## 2020-05-07 NOTE — Anesthesia Postprocedure Evaluation (Signed)
Anesthesia Post Note  Patient: NEFTALI THUROW  Procedure(s) Performed: Left shoulder arthroscopy, subacromial decompression, distal clavicle resection, rotator cuff repair (Left Shoulder)     Patient location during evaluation: PACU Anesthesia Type: General Level of consciousness: awake and alert Pain management: pain level controlled Vital Signs Assessment: post-procedure vital signs reviewed and stable Respiratory status: spontaneous breathing, nonlabored ventilation, respiratory function stable and patient connected to nasal cannula oxygen Cardiovascular status: blood pressure returned to baseline and stable Postop Assessment: no apparent nausea or vomiting Anesthetic complications: no   No complications documented.  Last Vitals:  Vitals:   05/07/20 1245 05/07/20 1300  BP: 111/64 116/62  Pulse: (!) 54 (!) 53  Resp: (!) 9 16  Temp:  (!) 36.1 C  SpO2: 96% 96%    Last Pain:  Vitals:   05/07/20 1300  TempSrc:   PainSc: 3                  Beryle Lathe

## 2020-05-07 NOTE — H&P (Signed)
[name]    Chief Complaint: Left shoulder impingement acromioclavicular osteoarthritis, rotator cuff tear HPI: The patient is a 54 y.o. female with chronic and progressively increasing left shoulder pain related to an impingement syndrome with symptomatic AC joint arthritis and MRI scan evidence of a full-thickness rotator cuff tear.  Due to her ongoing pain and functional imitations and failure to respond to conservative management she is brought to the operating this time for planned left shoulder arthroscopy as discussed with the patient in the office preoperatively  Past Medical History:  Diagnosis Date  . Anxiety   . Arthritis   . Asthma   . Chronic kidney disease    IGA  . Complication of anesthesia   . Depression   . Diabetes mellitus without complication (HCC)    type II   . GERD (gastroesophageal reflux disease)   . Heart murmur    since birth- no need to be concern  . History of hiatal hernia   . History of kidney stones    hx of   . Hypertension   . PONV (postoperative nausea and vomiting)   . Shortness of breath    With exertion  . Sleep apnea    severe sleep apnea uses oxygen 2l at nite     Past Surgical History:  Procedure Laterality Date  . ABDOMINAL HYSTERECTOMY    . BREAST SURGERY Right    biospy x2  . CATARACT EXTRACTION     right eye  . devaited spetum surgery     . DILATION AND CURETTAGE OF UTERUS    . ESOPHAGOGASTRODUODENOSCOPY (EGD) WITH PROPOFOL N/A 12/26/2016   Procedure: ESOPHAGOGASTRODUODENOSCOPY (EGD) WITH PROPOFOL;  Surgeon: Luretha Murphy, MD;  Location: [facility] END[facility]COPY;  Service: General;  Laterality: N/A;  . ESOPHAGOGASTRODUODENOSCOPY (EGD) WITH PROPOFOL N/A 12/07/2017   Procedure: ESOPHAGOGASTRODUODENOSCOPY (EGD) WITH PROPOFOL;  Surgeon: Carman Ching, MD;  Location: [facility] ENDOSCOPY;  Service: Endoscopy;  Laterality: N/A;  . EYE SURGERY  04/23/2019   right,Pupil is dilated  . LAPAROSCOPIC NISSEN FUNDOPLICATION N/A 01/16/2017   Procedure:  LAPAROSCOPIC ENTEROLYSIS TAKEDOWN OF PRIOR NISSEN FUNDOPLICATION WITH UPPER ENDOSCOPY;  Surgeon: Luretha Murphy, MD;  Location: [facility] ORS;  Service: General;  Laterality: N/A;  . LUMBAR LAMINECTOMY/DECOMPRESSION MICRODISCECTOMY  10/23/2013   L4 L5   DR Shon Baton  . LUMBAR LAMINECTOMY/DECOMPRESSION MICRODISCECTOMY N/A 10/23/2013   Procedure: DECOMPRESSION AND FACET REMOVAL L4 - L5 1 LEVEL;  Surgeon: Venita Lick, MD;  Location: [facility] OR;  Service: Orthopedics;  Laterality: N/A;  . nasal repair collapse      both sides of nose and 2 plates added   . NASAL SINUS SURGERY    . NISSEN FUNDOPLICATION    . right foot surgery     . SEPTOPLASTY  2013  . SHOULDER ARTHROSCOPY WITH ROTATOR CUFF REPAIR Right    x 2  . WRIST SURGERY Left    work injury    Family History  Problem Relation Age of Onset  . Alcohol abuse Brother   . Drug abuse Other   . Stroke Mother   . Hypertension Mother   . Diabetes Mother   . Kidney disease Mother   . Lung cancer Father   . Heart attack Sister   . Lung cancer Brother     Social History:  reports that she has never smoked. She has never used smokeless tobacco. She reports that she does not drink alcohol and does not use drugs.   Medications Prior to Admission  Medication Sig Dispense  Refill  . albuterol (PROVENTIL HFA;VENTOLIN HFA) 108 (90 Base) MCG/ACT inhaler Inhale 2 puffs into the lungs every 4 (four) hours as needed for wheezing or shortness of breath.    . allopurinol (ZYLOPRIM) 100 MG tablet Take 100 mg by mouth daily with breakfast.     . ALPRAZolam (XANAX) 1 MG tablet Take 1 tablet (1 mg total) by mouth 3 (three) times daily. (Patient taking differently: Take 1 mg by mouth 3 (three) times daily as needed for anxiety. ) 270 tablet 2  . Ascorbic Acid (VITAMIN C) 1000 MG tablet Take 1,000 mg by mouth daily.    . brimonidine (ALPHAGAN) 0.2 % ophthalmic solution Place 1 drop into the right eye in the morning, at noon, and at bedtime.    . Cholecalciferol  (VITAMIN D-3) 5000 units TABS Take 5,000 Units by mouth daily.    . dorzolamide-timolol (COSOPT) 22.3-6.8 MG/ML ophthalmic solution Place 1 drop into the right eye in the morning and at bedtime.    . DULoxetine (CYMBALTA) 60 MG capsule Take 1 capsule (60 mg total) by mouth daily. 90 capsule 2  . folic acid (FOLVITE) 1 MG tablet Take 1 mg by mouth 2 (two) times daily.    . furosemide (LASIX) 20 MG tablet Take 20 mg by mouth daily.    Marland Kitchen glipiZIDE (GLUCOTROL XL) 2.5 MG 24 hr tablet Take 2.5 mg by mouth daily with breakfast.    . hydrOXYzine (VISTARIL) 100 MG capsule Take 1 capsule (100 mg total) by mouth at bedtime. 90 capsule 2  . levocetirizine (XYZAL) 5 MG tablet Take 5 mg by mouth at bedtime.    Marland Kitchen linaclotide (LINZESS) 290 MCG CAPS capsule Take 290 mcg by mouth daily as needed (constipation).     . metFORMIN (GLUCOPHAGE-XR) 500 MG 24 hr tablet Take 500 mg by mouth daily with supper.    . metoprolol succinate (TOPROL-XL) 100 MG 24 hr tablet Take 100 mg by mouth daily. Take with or immediately following a meal.    . ondansetron (ZOFRAN) 4 MG tablet Take 4 mg by mouth every 8 (eight) hours as needed for nausea or vomiting.    . OXYGEN Inhale 2 L into the lungs at bedtime.    . pantoprazole (PROTONIX) 40 MG tablet Take 40 mg by mouth 2 (two) times daily.     . polyethylene glycol (MIRALAX / GLYCOLAX) 17 g packet Take 17 g by mouth 4 (four) times a week.     . potassium chloride (K-DUR) 10 MEQ tablet Take 10 mEq by mouth 3 (three) times daily.     . prednisoLONE acetate (PRED FORTE) 1 % ophthalmic suspension Place 1 drop into the right eye in the morning and at bedtime.     Marland Kitchen spironolactone-hydrochlorothiazide (ALDACTAZIDE) 25-25 MG tablet Take 1 tablet by mouth daily.    Marland Kitchen zinc gluconate 50 MG tablet Take 50 mg by mouth daily.    Marland Kitchen zolpidem (AMBIEN) 10 MG tablet Take 1 tablet (10 mg total) by mouth at bedtime. 90 tablet 2     Physical Exam: Left shoulder demonstrates painful and guarded motion.   Global weakness.  Positive impingement sign.  Locally tender about the [facility] joint with positive crossover.  Preoperative MRI scan confirms significant bony impingement as well as a full-thickness rotator cuff tear.  Vitals  Temp:  [98.2 F (36.8 C)] 98.2 F (36.8 C) (06/24 0828) Pulse Rate:  [72] 72 (06/24 0828) Resp:  [16-19] 19 (06/24 0903) BP: (136-137)/(80-82) 137/80 (06/24 0910) SpO2:  [  100 %] 100 % (06/24 0828) Weight:  [106.8 kg] 106.8 kg (06/24 1586)  Assessment/Plan  Impression: Left shoulder impingement acromioclavicular osteoarthritis, rotator cuff tear  Plan of Action: Procedure(s): Left shoulder arthroscopy, subacromial decompression, distal clavicle resection, rotator cuff repair  Raidyn Breiner M Evaleigh Mccamy 05/07/2020, 9:24 AM Contact # [phone]

## 2020-05-07 NOTE — Progress Notes (Signed)
AssistedDr. Brock with left, ultrasound guided, interscalene  block. Side rails up, monitors on throughout procedure. See vital signs in flow sheet. Tolerated Procedure well.  

## 2020-05-07 NOTE — Anesthesia Procedure Notes (Signed)
Anesthesia Regional Block: Interscalene brachial plexus block   Pre-Anesthetic Checklist: ,, timeout performed, Correct Patient, Correct Site, Correct Laterality, Correct Procedure, Correct Position, site marked, Risks and benefits discussed,  Surgical consent,  Pre-op evaluation,  At surgeon's request and post-op pain management  Laterality: Left  Prep: chloraprep       Needles:  Injection technique: Single-shot  Needle Type: Echogenic Needle     Needle Length: 5cm  Needle Gauge: 21     Additional Needles:   Narrative:  Start time: 05/07/2020 9:04 AM End time: 05/07/2020 9:08 AM Injection made incrementally with aspirations every 5 mL.  Performed by: Personally  Anesthesiologist: Beryle Lathe, MD  Additional Notes: No pain on injection. No increased resistance to injection. Injection made in 5cc increments. Good needle visualization. Patient tolerated the procedure well.

## 2020-05-07 NOTE — Anesthesia Preprocedure Evaluation (Addendum)
Anesthesia Evaluation  Patient identified by MRN, date of birth, ID band Patient awake    Reviewed: Allergy & Precautions, NPO status , Patient's Chart, lab work & pertinent test results, reviewed documented beta blocker date and time   History of Anesthesia Complications (+) PONV and history of anesthetic complications  Airway Mallampati: II  TM Distance: >3 FB Neck ROM: Full    Dental  (+) Upper Dentures, Lower Dentures   Pulmonary asthma , sleep apnea and Oxygen sleep apnea ,    Pulmonary exam normal        Cardiovascular hypertension, Pt. on medications and Pt. on home beta blockers Normal cardiovascular exam     Neuro/Psych PSYCHIATRIC DISORDERS Anxiety Depression negative neurological ROS     GI/Hepatic Neg liver ROS, hiatal hernia, GERD  Medicated and Controlled,  Endo/Other  diabetes, Type 2, Oral Hypoglycemic Agents Obesity   Renal/GU CRFRenal disease     Musculoskeletal  (+) Arthritis ,   Abdominal   Peds  Hematology negative hematology ROS (+)   Anesthesia Other Findings Covid test negative   Reproductive/Obstetrics                            Anesthesia Physical Anesthesia Plan  ASA: III  Anesthesia Plan: General   Post-op Pain Management:  Regional for Post-op pain   Induction: Intravenous  PONV Risk Score and Plan: 4 or greater and Treatment may vary due to age or medical condition, Ondansetron, Dexamethasone, Midazolam and Scopolamine patch - Pre-op  Airway Management Planned: Oral ETT  Additional Equipment: None  Intra-op Plan:   Post-operative Plan: Extubation in OR  Informed Consent: I have reviewed the patients History and Physical, chart, labs and discussed the procedure including the risks, benefits and alternatives for the proposed anesthesia with the patient or authorized representative who has indicated his/her understanding and acceptance.      Dental advisory given  Plan Discussed with: CRNA and Anesthesiologist  Anesthesia Plan Comments:        Anesthesia Quick Evaluation

## 2020-05-07 NOTE — Anesthesia Procedure Notes (Signed)
Procedure Name: Intubation Date/Time: 05/07/2020 10:27 AM Performed by: Lavina Hamman, CRNA Pre-anesthesia Checklist: Patient identified, Emergency Drugs available, Suction available, Patient being monitored and Timeout performed Patient Re-evaluated:Patient Re-evaluated prior to induction Oxygen Delivery Method: Circle system utilized Preoxygenation: Pre-oxygenation with 100% oxygen Induction Type: IV induction Ventilation: Mask ventilation without difficulty Laryngoscope Size: Mac and 3 Grade View: Grade I Tube type: Oral Tube size: 7.0 mm Number of attempts: 1 Airway Equipment and Method: Stylet Placement Confirmation: ETT inserted through vocal cords under direct vision,  positive ETCO2,  CO2 detector and breath sounds checked- equal and bilateral Secured at: 21 cm Tube secured with: Tape Dental Injury: Teeth and Oropharynx as per pre-operative assessment  Comments: ATOI

## 2020-05-07 NOTE — Discharge Instructions (Signed)
   Kevin M. Supple, M.D., F.A.A.O.S. Orthopaedic Surgery Specializing in Arthroscopic and Reconstructive Surgery of the Shoulder [phone] [address] - [place], [place] [address] - Fax [phone]  POST-OP SHOULDER ARTHROSCOPIC ROTATOR CUFF  REPAIR INSTRUCTIONS  1. Call the office at [phone] to schedule your first post-op appointment 7-10 days from the date of your surgery.  2. Leave the steri-strips in place over your incisions when performing dressing changes and showering. You may remove your dressings and begin showering 72 hours from surgery. You can expect drainage that is clear to bloody in nature that occasionally will soak through your dressings. If this occurs go ahead and perform a dressing change. The drainage should lessen daily and when there is no drainage from your incisions feel free to go without a dressing.  3. Wear your sling/immobilizer at all times except to perform the exercises below or to occasionally let your arm dangle by your side to stretch your elbow. You also need to sleep in your sling immobilizer until instructed otherwise.  4. Range of motion to your elbow, wrist, and hand are encouraged 3-5 times daily. Exercise to your hand and fingers helps to reduce swelling you may experience.  5. Utilize ice to the shoulder 3-4 times minimum a day and additionally if you are experiencing pain.  6. You may one-armed drive when safely off of narcotics and muscle relaxants. You may use your hand that is in the sling to support the steering wheel only. However, should it be your right arm that is in the sling it is not to be used for gear shifting in a manual transmission.  7. Pain control following an exparel block  To help control your post-operative pain you received a nerve block  performed with Exparel which is a long acting anesthetic (numbing agent) which can provide pain relief and sensations of numbness (and relief of pain) in the operative  shoulder and arm for up to 3 days. Sometimes it provides mixed relief, meaning you may still have numbness in certain areas of the arm but can still be able to move  parts of that arm, hand, and fingers. We recommend that your prescribed pain medications  be used as needed. We do not feel it is necessary to "pre medicate" and "stay ahead" of pain.  Taking narcotic pain medications when you are not having any pain can lead to unnecessary and potentially dangerous side effects.    8. Pain medications can produce constipation along with their use. If you experience this, the use of an over the counter stool softener or laxative daily is recommended.   9. For additional questions or concerns, please do not hesitate to call the office. If after hours there is an answering service to forward your concerns to the physician on call.  POST-OP EXERCISES  Pendulum Exercises  Perform pendulum exercises while standing and bending at the waist. Support your uninvolved arm on a table or chair and allow your operated arm to hang freely. Make sure to do these exercises passively - not using you shoulder muscle.  Repeat 20 times. Do 3 sessions per day.   

## 2020-05-07 NOTE — Op Note (Signed)
05/07/2020  11:31 AM  PATIENT:   [name]  54 y.o. female  PRE-OPERATIVE DIAGNOSIS:  Left shoulder impingement acromioclavicular osteoarthritis, rotator cuff tear  POST-OPERATIVE DIAGNOSIS: Same with additional finding of adhesive capsulitis  PROCEDURE:  1.  Left shoulder examination under anesthesia.  2.  Left shoulder manipulation under anesthesia.  3.  Left shoulder glenohumeral joint diagnostic arthroscopy  4.  Limited synovectomy, labral debridement, rotator cuff debridement  5.  Arthroscopic subacromial decompression and bursectomy  6.  Arthroscopic distal clavicle resection  7.  Arthroscopic rotator cuff repair using a double row suture bridge repair construct    SURGEON:  Keonta Monceaux, Vania Rea M.D.  ASSISTANTS: Ralene Bathe, PA-C  ANESTHESIA:   General endotracheal and interscalene block with Exparel  EBL: Minimal  SPECIMEN: None  Drains: None   PATIENT DISPOSITION:  PACU - hemodynamically stable.    PLAN OF CARE: Admit for overnight observation  Brief history:  [name] has been followed for chronic left shoulder pain with restricted mobility and functional rotations that have been refractory to prolonged attempts at conservative management.  Her preoperative MRI scan demonstrates a full-thickness rotator cuff tear as well as severe bony impingement and AC joint arthritis.  Due to her ongoing pain and functional mentation she is brought to the operating this time for planned left shoulder arthroscopy as described below.  Preoperatively I counseled [name] regarding treatment options as well as the potential risks versus benefits thereof.  Possible surgical complications were reviewed including bleeding, infection, neurovascular injury, persistent pain, loss of motion, anesthetic complication, recurrence of rotator cuff tear, and possible need for additional surgery.  She understands, and accepts, and agrees with our planned procedure.  Procedure in  detail:  After undergoing routine preop evaluation patient received prophylactic antibiotics and interscalene block with Exparel established in the holding area by the anesthesia department.  Patient subsequently placed supine on the operating table and underwent the smooth induction of a general endotracheal anesthesia.  Subsequent turned to the right lateral decubitus position on a beanbag and appropriately padded protected.  Left shoulder examination under anesthesia revealed restricted flexion to approximate 150 degrees.  Manipulation was performed with palpable and audible release of adhesions and rotation also showed release of adhesions.  Full motion was achieved.  Left arm was then suspended at 70 degrees of abduction with 15 pounds of traction in the left shoulder girdle region was sterilely prepped and draped in standard fashion.  Timeout was called.  A posterior portal established into the glenohumeral joint and an anterior portal was established under direct visualization.  The articular surfaces were found to be in good condition.  Diffuse synovitis was noted.  Capsule was noted to have been disrupted related to the manipulation.  Limited synovectomy was performed.  The labrum showed degenerative tearing primarily superiorly which was debrided with shaver.  The biceps tendon showed normal caliber with no evidence for proximal or distal instability.  The rotator cuff showed a significant articular sided tear which we debrided and ultimately passed a tag suture for identification on the bursal side.  The balance of the glenohumeral joint showed no additional pathologies.  Fluid and instruments were then removed.  The arm was dropped down to 30 degrees of abduction with the arthroscope introduced in the subacromial space of the posterior portal and a direct lateral portal established in the subacromial space.  Abundant dense bursal tissue and multiple adhesions were encountered and these were all divided  and excised with combination  the shaver the Stryker wand.  The wand was then used to remove the periosteum from the undersurface of the anterior half of the acromion and a subacromial decompression was performed with a bur creating a type I morphology.  A portal was then established directly anterior to the distal clavicle and a distal clavicle resection was performed with a bur.  Care was taken to permit visualization of the entire circumference of the distal clavicle to ensure adequate removal of bone.  We then returned our attention to the subacromial space where the rotator cuff was carefully probed and the area surrounding the tag suture showed severe attenuation and the probe easily passed through.  With this we debrided the rotator cuff tendon back to healthy tissue defect with a footprint approximate 2 cm in length.  The greater tuberosity was prepared removing soft tissue and abrading the bone to bleeding bed.  Through a stab wound off the lateral margin of the acromion placed an Arthrex peek corkscrew suture anchor loaded with 2 fiber tapes.  All 6 suture limbs were then passed equidistant across the width of the rotator cuff tear using the scorpion suture passer and these were then shuttled in an alternating fashion into 2 lateral anchors creating a double row repair which allowed excellent reapposition of the rotator cuff margin to the bony bed on the tuberosity.  Suture limbs were all then clipped.  Final hemostasis was obtained.  Fluid and instruments removed.  The portals were closed with a Monocryl and a Steri-Strip.  A dry dressing taped about the left shoulder left arm is placed into a sling immobilizer and patient was awakened, extubated, and taken to recovery in stable addition.  Jenetta Loges, PA-C was utilized as an Environmental consultant throughout this case, essential for help with positioning the patient, positioning extremity, tissue manipulation, implantation of the prosthesis, suture management,  wound closure, and intraoperative decision-making.  Marin Shutter MD   Contact # [phone]

## 2020-05-08 ENCOUNTER — Encounter (HOSPITAL_COMMUNITY): Payer: Self-pay | Admitting: Orthopedic Surgery

## 2020-05-08 DIAGNOSIS — M19012 Primary osteoarthritis, left shoulder: Secondary | ICD-10-CM | POA: Diagnosis not present

## 2020-05-08 LAB — GLUCOSE, CAPILLARY
Glucose-Capillary: 97 mg/dL (ref 70–99)
Glucose-Capillary: 108 mg/dL — ABNORMAL HIGH (ref 70–99)

## 2020-05-08 MED ORDER — OXYCODONE-ACETAMINOPHEN 10-325 MG PO TABS: 1.0000 | ORAL_TABLET | Freq: Four times a day (QID) | ORAL | 0 refills | Status: AC | PRN

## 2020-05-08 NOTE — Plan of Care (Signed)
  Problem: Education: Goal: Knowledge of General Education information will improve Description Including pain rating scale, medication(s)/side effects and non-pharmacologic comfort measures Outcome: Progressing   

## 2020-05-08 NOTE — Discharge Summary (Signed)
PATIENT ID:      [name]  MRN:     [id number] DOB/AGE:    04-02-1966 / 54 y.o. y.o.     DISCHARGE SUMMARY  ADMISSION DATE:    05/07/2020 DISCHARGE DATE:    ADMISSION DIAGNOSIS: Left shoulder impingement acromioclavicular osteoarthritis, rotator cuff tear Past Medical History:  Diagnosis Date  . Anxiety   . Arthritis   . Asthma   . Chronic kidney disease    IGA  . Complication of anesthesia   . Depression   . Diabetes mellitus without complication (HCC)    type II   . GERD (gastroesophageal reflux disease)   . Heart murmur    since birth- no need to be concern  . History of hiatal hernia   . History of kidney stones    hx of   . Hypertension   . PONV (postoperative nausea and vomiting)   . Shortness of breath    With exertion  . Sleep apnea    severe sleep apnea uses oxygen 2l at nite     DISCHARGE DIAGNOSIS:   Active Problems:   S/P left rotator cuff repair   PROCEDURE: Procedure(s): Left shoulder arthroscopy, subacromial decompression, distal clavicle resection, rotator cuff repair on 05/07/2020  CONSULTS:    HISTORY:  See H&P in chart.  HOSPITAL COURSE:  [name] is a 54 y.o. admitted on 05/07/2020 with a diagnosis of Left shoulder impingement acromioclavicular osteoarthritis, rotator cuff tear.  They were brought to the operating room on 05/07/2020 and underwent Procedure(s): Left shoulder arthroscopy, subacromial decompression, distal clavicle resection, rotator cuff repair.    They were given perioperative antibiotics:  Anti-infectives (From admission, onward)   Start     Dose/Rate Route Frequency Ordered Stop   05/07/20 1000  vancomycin (VANCOREADY) IVPB 1500 mg/300 mL        1,500 mg 150 mL/hr over 120 Minutes Intravenous  Once 05/06/20 0740 05/07/20 1129   05/07/20 0800  ceFAZolin (ANCEF) IVPB 2g/100 mL premix        2 g 200 mL/hr over 30 Minutes Intravenous On call to O.R. 05/07/20 0757 05/07/20 1011    .  Patient underwent the above named  procedure and tolerated it well. The following day they were hemodynamically stable and pain was controlled on oral analgesics. They were neurovascularly intact to the operative extremity. . They were medically and orthopaedically stable for discharge on .    DIAGNOSTIC STUDIES:  RECENT RADIOGRAPHIC STUDIES :  No results found.  RECENT VITAL SIGNS:   Patient Vitals for the past 24 hrs:  BP Temp Temp src Pulse Resp SpO2 Height Weight  05/08/20 0547 111/64 97.7 F (36.5 C) Oral (!) 55 16 100 % -- --  05/08/20 0147 123/63 (!) 97.5 F (36.4 C) Oral (!) 53 16 98 % -- --  05/07/20 2138 124/74 97.7 F (36.5 C) Oral (!) 56 16 100 % -- --  05/07/20 1804 -- -- -- -- -- -- 5\' 6"  (1.676 m) 106.8 kg  05/07/20 1717 107/68 97.7 F (36.5 C) Oral (!) 55 16 98 % -- --  05/07/20 1526 109/70 97.7 F (36.5 C) Oral 66 16 93 % -- --  05/07/20 1300 116/62 (!) 97 F (36.1 C) -- (!) 53 16 96 % -- --  05/07/20 1245 111/64 -- -- (!) 54 (!) 9 96 % -- --  05/07/20 1230 125/73 -- -- 63 13 96 % -- --  05/07/20 1215 121/76 -- -- 61 14 100 % -- --  05/07/20 1200 116/67 -- -- (!) 59 16 100 % -- --  05/07/20 1145 114/69 -- -- 63 19 99 % -- --  05/07/20 1131 115/66 (!) 97.5 F (36.4 C) -- 66 18 96 % -- --  05/07/20 0910 137/80 -- -- -- -- -- -- --  05/07/20 0903 -- -- -- -- 19 -- -- --  05/07/20 0828 136/82 98.2 F (36.8 C) Oral 72 16 100 % -- --  05/07/20 0177 -- -- -- -- -- -- -- 106.8 kg  .  RECENT EKG RESULTS:    Orders placed or performed during the hospital encounter of 01/16/17  . EKG 12-Lead  . EKG 12-Lead  . EKG 12-Lead  . EKG 12-Lead  . EKG 12-Lead  . EKG 12-Lead    DISCHARGE INSTRUCTIONS:    DISCHARGE MEDICATIONS:   Allergies as of 05/08/2020      Reactions   Bacitracin Rash   Adhesive [tape] Other (See Comments)   Makes skin pull off *Band-aids and Plastic tape" pls use paper tape   Codeine Hives, Itching   Macrobid [nitrofurantoin Monohyd Macro] Hives, Itching   Neosporin  [neomycin-bacitracin Zn-polymyx] Rash      Medication List    TAKE these medications   albuterol 108 (90 Base) MCG/ACT inhaler Commonly known as: VENTOLIN HFA Inhale 2 puffs into the lungs every 4 (four) hours as needed for wheezing or shortness of breath.   allopurinol 100 MG tablet Commonly known as: ZYLOPRIM Take 100 mg by mouth daily with breakfast.   ALPRAZolam 1 MG tablet Commonly known as: Xanax Take 1 tablet (1 mg total) by mouth 3 (three) times daily. What changed:   when to take this  reasons to take this   brimonidine 0.2 % ophthalmic solution Commonly known as: ALPHAGAN Place 1 drop into the right eye in the morning, at noon, and at bedtime.   cyclobenzaprine 10 MG tablet Commonly known as: FLEXERIL Take 1 tablet (10 mg total) by mouth 3 (three) times daily as needed for muscle spasms.   dorzolamide-timolol 22.3-6.8 MG/ML ophthalmic solution Commonly known as: COSOPT Place 1 drop into the right eye in the morning and at bedtime.   DULoxetine 60 MG capsule Commonly known as: CYMBALTA Take 1 capsule (60 mg total) by mouth daily.   folic acid 1 MG tablet Commonly known as: FOLVITE Take 1 mg by mouth 2 (two) times daily.   furosemide 20 MG tablet Commonly known as: LASIX Take 20 mg by mouth daily.   glipiZIDE 2.5 MG 24 hr tablet Commonly known as: GLUCOTROL XL Take 2.5 mg by mouth daily with breakfast.   hydrOXYzine 100 MG capsule Commonly known as: VISTARIL Take 1 capsule (100 mg total) by mouth at bedtime.   levocetirizine 5 MG tablet Commonly known as: XYZAL Take 5 mg by mouth at bedtime.   Linzess 290 MCG Caps capsule Generic drug: linaclotide Take 290 mcg by mouth daily as needed (constipation).   metFORMIN 500 MG 24 hr tablet Commonly known as: GLUCOPHAGE-XR Take 500 mg by mouth daily with supper.   metoprolol succinate 100 MG 24 hr tablet Commonly known as: TOPROL-XL Take 100 mg by mouth daily. Take with or immediately following a  meal.   ondansetron 4 MG tablet Commonly known as: ZOFRAN Take 4 mg by mouth every 8 (eight) hours as needed for nausea or vomiting.   oxyCODONE-acetaminophen 10-325 MG tablet Commonly known as: Percocet Take 1 tablet by mouth every 6 (six) hours as needed for pain.  OXYGEN Inhale 2 L into the lungs at bedtime.   pantoprazole 40 MG tablet Commonly known as: PROTONIX Take 40 mg by mouth 2 (two) times daily.   polyethylene glycol 17 g packet Commonly known as: MIRALAX / GLYCOLAX Take 17 g by mouth 4 (four) times a week.   potassium chloride 10 MEQ tablet Commonly known as: KLOR-CON Take 10 mEq by mouth 3 (three) times daily.   prednisoLONE acetate 1 % ophthalmic suspension Commonly known as: PRED FORTE Place 1 drop into the right eye in the morning and at bedtime.   spironolactone-hydrochlorothiazide 25-25 MG tablet Commonly known as: ALDACTAZIDE Take 1 tablet by mouth daily.   vitamin C 1000 MG tablet Take 1,000 mg by mouth daily.   Vitamin D-3 125 MCG (5000 UT) Tabs Take 5,000 Units by mouth daily.   zinc gluconate 50 MG tablet Take 50 mg by mouth daily.   zolpidem 10 MG tablet Commonly known as: AMBIEN Take 1 tablet (10 mg total) by mouth at bedtime.       FOLLOW UP VISIT:    Follow-up Information    Francena Hanly, MD.   Specialty: Orthopedic Surgery Why: follow up 7-10 days Contact information: [address] [place] [address] [phone]               DISCHARGE TO: Home      DISCHARGE CONDITION:  Eustaquio Maize Tabathia Knoche for Dr. Francena Hanly 05/08/2020, 6:58 AM

## 2020-05-19 ENCOUNTER — Telehealth (INDEPENDENT_AMBULATORY_CARE_PROVIDER_SITE_OTHER): Payer: Medicare HMO | Admitting: Psychiatry

## 2020-05-19 ENCOUNTER — Encounter (HOSPITAL_COMMUNITY): Payer: Self-pay | Admitting: Psychiatry

## 2020-05-19 ENCOUNTER — Other Ambulatory Visit: Payer: Self-pay

## 2020-05-19 DIAGNOSIS — F332 Major depressive disorder, recurrent severe without psychotic features: Secondary | ICD-10-CM

## 2020-05-19 MED ORDER — ALPRAZOLAM 1 MG PO TABS: 1.0000 mg | ORAL_TABLET | Freq: Three times a day (TID) | ORAL | 2 refills | Status: DC | PRN

## 2020-05-19 MED ORDER — ZOLPIDEM TARTRATE 10 MG PO TABS: 10.0000 mg | ORAL_TABLET | Freq: Every day | ORAL | 2 refills | Status: DC

## 2020-05-19 MED ORDER — DULOXETINE HCL 60 MG PO CPEP: 60.0000 mg | ORAL_CAPSULE | Freq: Every day | ORAL | 2 refills | Status: DC

## 2020-05-19 MED ORDER — HYDROXYZINE PAMOATE 100 MG PO CAPS: 100.0000 mg | ORAL_CAPSULE | Freq: Every day | ORAL | 2 refills | Status: DC

## 2020-05-19 NOTE — Progress Notes (Signed)
Virtual Visit via Telephone Note  I connected with Marcelino Scot on 05/19/20 at  1:20 PM EDT by telephone and verified that I am speaking with the correct person using two identifiers.   I discussed the limitations, risks, security and privacy concerns of performing an evaluation and management service by telephone and the availability of in person appointments. I also discussed with the patient that there may be a patient responsible charge related to this service. The patient expressed understanding and agreed to proceed.   I discussed the assessment and treatment plan with the patient. The patient was provided an opportunity to ask questions and all were answered. The patient agreed with the plan and demonstrated an understanding of the instructions.   The patient was advised to call back or seek an in-person evaluation if the symptoms worsen or if the condition fails to improve as anticipated.  I provided 15 minutes of non-face-to-face time during this encounter. Location: Provider office, patient home  [name], MD  [facility] MD/PA/NP OP Progress Note  05/19/2020 1:50 PM TAMANA HATFIELD  MRN:  [id number]  Chief Complaint:  Chief Complaint    Depression; Anxiety; Follow-up     HPI: this patient is a 54year-old divorced white female who lives alone in [place]. She has no children and is unemployed. She is applying for disability.  The patient was returned referred by Sharyn Dross, her therapist, for further assessment and treatment of depression and anxiety.  The patient is a very poor historian made virtually no eye contact and answered questions in monosyllables. She did relate that she's been depressed for at least 17 years. In the intervening time she has gone through a divorce her sister died in her father died. She's been in therapy most of this time and is either seen psychiatrist or been treated by her primary doctor for depression. She's been on numerous  antidepressants but doesn't remember any of the names except Zoloft. She's currently on a combination of Cymbalta and Wellbutrin which she states isn't working. Xanax is given at night and she states it helps a little bit  The patient returns for follow-up after 3 months.  She has had a lot of medical issues.  She had arthroscopic surgery couple weeks ago on her right shoulder.  She is also still experiencing visual problems and will need laser surgery for that.  She also was found to have a possible thyroid nodule and is going to be seeing an endocrinologist for work-up.  She is very fatigued lately and thinks it may be due to the thyroid problems.  She does not have any recent labs on record.  She states her mood has been fairly stable.  She has had trouble sleeping because she is uncomfortable with her arm in a sling and she is sleeping in a recliner.  She is interacting with her family and her best friend a little bit.  She denies thoughts of self-harm or suicidal ideation Visit Diagnosis:    ICD-10-CM   1. 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.2     Past Psychiatric History: Long-term outpatient treatment  Past Medical History:  Past Medical History:  Diagnosis Date  . Anxiety   . Arthritis   . Asthma   . Chronic kidney disease    IGA  . Complication of anesthesia   . Depression   . Diabetes mellitus without complication (HCC)    type II   . GERD (gastroesophageal reflux  disease)   . Heart murmur    since birth- no need to be concern  . History of hiatal hernia   . History of kidney stones    hx of   . Hypertension   . PONV (postoperative nausea and vomiting)   . Shortness of breath    With exertion  . Sleep apnea    severe sleep apnea uses oxygen 2l at nite     Past Surgical History:  Procedure Laterality Date  . ABDOMINAL HYSTERECTOMY    . BREAST SURGERY Right    biospy x2  . CATARACT EXTRACTION     right eye  . devaited  spetum surgery     . DILATION AND CURETTAGE OF UTERUS    . ESOPHAGOGASTRODUODENOSCOPY (EGD) WITH PROPOFOL N/A 12/26/2016   Procedure: ESOPHAGOGASTRODUODENOSCOPY (EGD) WITH PROPOFOL;  Surgeon: Luretha Murphy, MD;  Location: [facility] ENDOSCOPY;  Service: General;  Laterality: N/A;  . ESOPHAGOGASTRODUODENOSCOPY (EGD) WITH PROPOFOL N/A 12/07/2017   Procedure: ESOPHAGOGASTRODUODENOSCOPY (EGD) WITH PROPOFOL;  Surgeon: Carman Ching, MD;  Location: [facility] ENDOSCOPY;  Service: Endoscopy;  Laterality: N/A;  . EYE SURGERY  04/23/2019   right,Pupil is dilated  . LAPAROSCOPIC NISSEN FUNDOPLICATION N/A 01/16/2017   Procedure: LAPAROSCOPIC ENTEROLYSIS TAKEDOWN OF PRIOR NISSEN FUNDOPLICATION WITH UPPER ENDOSCOPY;  Surgeon: Luretha Murphy, MD;  Location: [facility] ORS;  Service: General;  Laterality: N/A;  . LUMBAR LAMINECTOMY/DECOMPRESSION MICRODISCECTOMY  10/23/2013   L4 L5   DR Shon Baton  . LUMBAR LAMINECTOMY/DECOMPRESSION MICRODISCECTOMY N/A 10/23/2013   Procedure: DECOMPRESSION AND FACET REMOVAL L4 - L5 1 LEVEL;  Surgeon: Venita Lick, MD;  Location: [facility] OR;  Service: Orthopedics;  Laterality: N/A;  . nasal repair collapse      both sides of nose and 2 plates added   . NASAL SINUS SURGERY    . NISSEN FUNDOPLICATION    . right foot surgery     . SEPTOPLASTY  2013  . SHOULDER ARTHROSCOPY WITH ROTATOR CUFF REPAIR Right    x 2  . SHOULDER ARTHROSCOPY WITH ROTATOR CUFF REPAIR Left 05/07/2020   Procedure: Left shoulder arthroscopy, subacromial decompression, distal clavicle resection, rotator cuff repair;  Surgeon: Francena Hanly, MD;  Location: [facility] ORS;  Service: Orthopedics;  Laterality: Left;   . WRIST SURGERY Left    work injury    Family Psychiatric History: see below  Family History:  Family History  Problem Relation Age of Onset  . Alcohol abuse Brother   . Drug abuse Other   . Stroke Mother   . Hypertension Mother   . Diabetes Mother   . Kidney disease Mother   . Lung cancer Father   . Heart attack  Sister   . Lung cancer Brother     Social History:  Social History   Socioeconomic History  . Marital status: Single    Spouse name: Not on file  . Number of children: Not on file  . Years of education: Not on file  . Highest education level: Not on file  Occupational History  . Not on file  Tobacco Use  . Smoking status: Never Smoker  . Smokeless tobacco: Never Used  Vaping Use  . Vaping Use: Never used  Substance and Sexual Activity  . Alcohol use: No  . Drug use: No  . Sexual activity: Never  Other Topics Concern  . Not on file  Social History Narrative  . Not on file   Social Determinants of Health   Financial Resource Strain:   . Difficulty of Paying  Living Expenses:   Food Insecurity:   . Worried About Programme researcher, broadcasting/film/video in the Last Year:   . Barista in the Last Year:   Transportation Needs:   . Freight forwarder (Medical):   Marland Kitchen Lack of Transportation (Non-Medical):   Physical Activity:   . Days of Exercise per Week:   . Minutes of Exercise per Session:   Stress:   . Feeling of Stress :   Social Connections:   . Frequency of Communication with Friends and Family:   . Frequency of Social Gatherings with Friends and Family:   . Attends Religious Services:   . Active Member of Clubs or Organizations:   . Attends Banker Meetings:   Marland Kitchen Marital Status:     Allergies:  Allergies  Allergen Reactions  . Bacitracin Rash  . Adhesive [Tape] Other (See Comments)    Makes skin pull off *Band-aids and Plastic tape" pls use paper tape  . Codeine Hives and Itching  . Macrobid [Nitrofurantoin Monohyd Macro] Hives and Itching  . Neosporin [Neomycin-Bacitracin Zn-Polymyx] Rash    Metabolic Disorder Labs: Lab Results  Component Value Date   HGBA1C 6.1 (H) 05/04/2020   MPG 128 05/04/2020   MPG 137 11/25/2016   No results found for: PROLACTIN No results found for: CHOL, TRIG, HDL, CHOLHDL, VLDL, LDLCALC No results found for:  TSH  Therapeutic Level Labs: No results found for: LITHIUM No results found for: VALPROATE No components found for:  CBMZ  Current Medications: Current Outpatient Medications  Medication Sig Dispense Refill  . albuterol (PROVENTIL HFA;VENTOLIN HFA) 108 (90 Base) MCG/ACT inhaler Inhale 2 puffs into the lungs every 4 (four) hours as needed for wheezing or shortness of breath.    . allopurinol (ZYLOPRIM) 100 MG tablet Take 100 mg by mouth daily with breakfast.     . ALPRAZolam (XANAX) 1 MG tablet Take 1 tablet (1 mg total) by mouth 3 (three) times daily as needed for anxiety. 90 tablet 2  . Ascorbic Acid (VITAMIN C) 1000 MG tablet Take 1,000 mg by mouth daily.    . brimonidine (ALPHAGAN) 0.2 % ophthalmic solution Place 1 drop into the right eye in the morning, at noon, and at bedtime.    . Cholecalciferol (VITAMIN D-3) 5000 units TABS Take 5,000 Units by mouth daily.    . cyclobenzaprine (FLEXERIL) 10 MG tablet Take 1 tablet (10 mg total) by mouth 3 (three) times daily as needed for muscle spasms. 30 tablet 1  . dorzolamide-timolol (COSOPT) 22.3-6.8 MG/ML ophthalmic solution Place 1 drop into the right eye in the morning and at bedtime.    . DULoxetine (CYMBALTA) 60 MG capsule Take 1 capsule (60 mg total) by mouth daily. 90 capsule 2  . folic acid (FOLVITE) 1 MG tablet Take 1 mg by mouth 2 (two) times daily.    . furosemide (LASIX) 20 MG tablet Take 20 mg by mouth daily.    Marland Kitchen glipiZIDE (GLUCOTROL XL) 2.5 MG 24 hr tablet Take 2.5 mg by mouth daily with breakfast.    . hydrOXYzine (VISTARIL) 100 MG capsule Take 1 capsule (100 mg total) by mouth at bedtime. 90 capsule 2  . levocetirizine (XYZAL) 5 MG tablet Take 5 mg by mouth at bedtime.    Marland Kitchen linaclotide (LINZESS) 290 MCG CAPS capsule Take 290 mcg by mouth daily as needed (constipation).     . metFORMIN (GLUCOPHAGE-XR) 500 MG 24 hr tablet Take 500 mg by mouth daily with supper.    Marland Kitchen  metoprolol succinate (TOPROL-XL) 100 MG 24 hr tablet Take 100 mg  by mouth daily. Take with or immediately following a meal.    . ondansetron (ZOFRAN) 4 MG tablet Take 4 mg by mouth every 8 (eight) hours as needed for nausea or vomiting.    Marland Kitchen. oxyCODONE-acetaminophen (PERCOCET) 10-325 MG tablet Take 1 tablet by mouth every 6 (six) hours as needed for pain. 28 tablet 0  . OXYGEN Inhale 2 L into the lungs at bedtime.    . pantoprazole (PROTONIX) 40 MG tablet Take 40 mg by mouth 2 (two) times daily.     . polyethylene glycol (MIRALAX / GLYCOLAX) 17 g packet Take 17 g by mouth 4 (four) times a week.     . potassium chloride (K-DUR) 10 MEQ tablet Take 10 mEq by mouth 3 (three) times daily.     . prednisoLONE acetate (PRED FORTE) 1 % ophthalmic suspension Place 1 drop into the right eye in the morning and at bedtime.     Marland Kitchen. spironolactone-hydrochlorothiazide (ALDACTAZIDE) 25-25 MG tablet Take 1 tablet by mouth daily.    Marland Kitchen. zinc gluconate 50 MG tablet Take 50 mg by mouth daily.    Marland Kitchen. zolpidem (AMBIEN) 10 MG tablet Take 1 tablet (10 mg total) by mouth at bedtime. 90 tablet 2   No current facility-administered medications for this visit.     Musculoskeletal: Strength & Muscle Tone: within normal limits Gait & Station: normal Patient leans: N/A  Psychiatric Specialty Exam: Review of Systems  Constitutional: Positive for fatigue.  Musculoskeletal: Positive for arthralgias and joint swelling.  Psychiatric/Behavioral: Positive for sleep disturbance.  All other systems reviewed and are negative.   There were no vitals taken for this visit.There is no height or weight on file to calculate BMI.  General Appearance: NA  Eye Contact:  NA  Speech:  Clear and Coherent  Volume:  Normal  Mood:  Anxious  Affect:  NA  Thought Process:  Goal Directed  Orientation:  Full (Time, Place, and Person)  Thought Content: Rumination   Suicidal Thoughts:  No  Homicidal Thoughts:  No  Memory:  Immediate;   Good Recent;   Good Remote;   Fair  Judgement:  Good  Insight:  Fair   Psychomotor Activity:  Decreased  Concentration:  Concentration: Good and Attention Span: Good  Recall:  Good  Fund of Knowledge: Good  Language: Good  Akathisia:  No  Handed:  Right  AIMS (if indicated): not done  Assets:  Communication Skills Desire for Improvement Resilience Social Support Talents/Skills  ADL's:  Intact  Cognition: WNL  Sleep:  Poor   Screenings:   Assessment and Plan: This patient is a 54 year old female with a history of chronic dysthymia depression and insomnia.  She does not sound as depressed as she has in the past and actually sounds rather hopeful about clearing up some of her medical issues.  She will continue hydroxyzine 100 mg as well as Ambien 10 mg at bedtime for sleep, Xanax 1 mg up to 3 times daily for anxiety and Cymbalta 60 mg daily for depression.  She will return to see me in 3 months   Diannia Rudereborah Florabelle Cardin, MD 05/19/2020, 1:50 PM

## 2020-08-17 ENCOUNTER — Encounter (HOSPITAL_COMMUNITY): Payer: Self-pay | Admitting: Psychiatry

## 2020-08-17 ENCOUNTER — Telehealth (INDEPENDENT_AMBULATORY_CARE_PROVIDER_SITE_OTHER): Payer: Medicare HMO | Admitting: Psychiatry

## 2020-08-17 ENCOUNTER — Other Ambulatory Visit: Payer: Self-pay

## 2020-08-17 DIAGNOSIS — F332 Major depressive disorder, recurrent severe without psychotic features: Secondary | ICD-10-CM

## 2020-08-17 MED ORDER — HYDROXYZINE PAMOATE 100 MG PO CAPS: 100.0000 mg | ORAL_CAPSULE | Freq: Every day | ORAL | 2 refills | Status: DC

## 2020-08-17 MED ORDER — ZOLPIDEM TARTRATE 10 MG PO TABS: 10.0000 mg | ORAL_TABLET | Freq: Every day | ORAL | 2 refills | Status: DC

## 2020-08-17 MED ORDER — DULOXETINE HCL 30 MG PO CPEP
30.0000 mg | ORAL_CAPSULE | Freq: Every day | ORAL | 2 refills | Status: DC
Start: 2020-08-17 — End: 2020-10-14

## 2020-08-17 MED ORDER — CYCLOBENZAPRINE HCL 10 MG PO TABS: 10.0000 mg | ORAL_TABLET | Freq: Three times a day (TID) | ORAL | 1 refills | Status: DC | PRN

## 2020-08-17 MED ORDER — DULOXETINE HCL 60 MG PO CPEP: 60.0000 mg | ORAL_CAPSULE | Freq: Every day | ORAL | 2 refills | Status: DC

## 2020-08-17 MED ORDER — ALPRAZOLAM 1 MG PO TABS: 1.0000 mg | ORAL_TABLET | Freq: Three times a day (TID) | ORAL | 2 refills | Status: DC | PRN

## 2020-08-17 NOTE — Progress Notes (Signed)
Virtual Visit via Telephone Note  I connected with [name] on 08/17/20 at  3:20 PM EDT by telephone and verified that I am speaking with the correct person using two identifiers.   I discussed the limitations, risks, security and privacy concerns of performing an evaluation and management service by telephone and the availability of in person appointments. I also discussed with the patient that there may be a patient responsible charge related to this service. The patient expressed understanding and agreed to proceed.    I discussed the assessment and treatment plan with the patient. The patient was provided an opportunity to ask questions and all were answered. The patient agreed with the plan and demonstrated an understanding of the instructions.   The patient was advised to call back or seek an in-person evaluation if the symptoms worsen or if the condition fails to improve as anticipated.  I provided 15 minutes of non-face-to-face time during this encounter. Location: Provider Home, patient home  [name], MD  [facility] MD/PA/NP OP Progress Note  08/17/2020 3:55 PM [name]  MRN:  [id number]  Chief Complaint:  Chief Complaint    Depression; Anxiety; Follow-up     HPI: this patient is a 54year-old divorced white female who lives alone in [place]. She has no children and is unemployed. She is applying for disability.  The patient was returned referred by Sharyn Dross, her therapist, for further assessment and treatment of depression and anxiety.  The patient is a very poor historian made virtually no eye contact and answered questions in monosyllables. She did relate that she's been depressed for at least 17 years. In the intervening time she has gone through a divorce her sister died in her father died. She's been in therapy most of this time and is either seen psychiatrist or been treated by her primary doctor for depression. She's been on numerous  antidepressants but doesn't remember any of the names except Zoloft. She's currently on a combination of Cymbalta and Wellbutrin which she states isn't working. Xanax is given at night and she states it helps a little bit  Patient returns after 3 months.  She is following up regarding her depression.  She states she is got more depressed lately because she lost 2 friends.  One died of Covid and 1 of heart disease.  This was in early September and she is still feeling very badly about this.  She does not feel like doing anything and has no energy.  She is not sleeping well.  She states the only thing that helped her sleep was the Flexeril that she got after her shoulder surgery.  I told her I could prescribe a little bit of this temporarily but she would really need to get this through orthopedics or family medicine.  She denies being suicidal but just feels sad all the time.  The 2 friends at died were people that she saw quite often.  She is seeing her therapist.  I suggested that we go up on her Cymbalta and she agrees Visit Diagnosis:    ICD-10-CM   1. 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.2     Past Psychiatric History: Long-term outpatient treatment  Past Medical History:  Past Medical History:  Diagnosis Date  . Anxiety   . Arthritis   . Asthma   . Chronic kidney disease    IGA  . Complication of anesthesia   . Depression   . Diabetes  mellitus without complication (HCC)    type II   . GERD (gastroesophageal reflux disease)   . Heart murmur    since birth- no need to be concern  . History of hiatal hernia   . History of kidney stones    hx of   . Hypertension   . PONV (postoperative nausea and vomiting)   . Shortness of breath    With exertion  . Sleep apnea    severe sleep apnea uses oxygen 2l at nite     Past Surgical History:  Procedure Laterality Date  . ABDOMINAL HYSTERECTOMY    . BREAST SURGERY Right    biospy x2  .  CATARACT EXTRACTION     right eye  . devaited spetum surgery     . DILATION AND CURETTAGE OF UTERUS    . ESOPHAGOGASTRODUODENOSCOPY (EGD) WITH PROPOFOL N/A 12/26/2016   Procedure: ESOPHAGOGASTRODUODENOSCOPY (EGD) WITH PROPOFOL;  Surgeon: Luretha Murphy, MD;  Location: [facility] ENDOSCOPY;  Service: General;  Laterality: N/A;  . ESOPHAGOGASTRODUODENOSCOPY (EGD) WITH PROPOFOL N/A 12/07/2017   Procedure: ESOPHAGOGASTRODUODENOSCOPY (EGD) WITH PROPOFOL;  Surgeon: Carman Ching, MD;  Location: [facility] ENDOSCOPY;  Service: Endoscopy;  Laterality: N/A;  . EYE SURGERY  04/23/2019   right,Pupil is dilated  . LAPAROSCOPIC NISSEN FUNDOPLICATION N/A 01/16/2017   Procedure: LAPAROSCOPIC ENTEROLYSIS TAKEDOWN OF PRIOR NISSEN FUNDOPLICATION WITH UPPER ENDOSCOPY;  Surgeon: Luretha Murphy, MD;  Location: [facility] ORS;  Service: General;  Laterality: N/A;  . LUMBAR LAMINECTOMY/DECOMPRESSION MICRODISCECTOMY  10/23/2013   L4 L5   DR Shon Baton  . LUMBAR LAMINECTOMY/DECOMPRESSION MICRODISCECTOMY N/A 10/23/2013   Procedure: DECOMPRESSION AND FACET REMOVAL L4 - L5 1 LEVEL;  Surgeon: Venita Lick, MD;  Location: [facility] OR;  Service: Orthopedics;  Laterality: N/A;  . nasal repair collapse      both sides of nose and 2 plates added   . NASAL SINUS SURGERY    . NISSEN FUNDOPLICATION    . right foot surgery     . SEPTOPLASTY  2013  . SHOULDER ARTHROSCOPY WITH ROTATOR CUFF REPAIR Right    x 2  . SHOULDER ARTHROSCOPY WITH ROTATOR CUFF REPAIR Left 05/07/2020   Procedure: Left shoulder arthroscopy, subacromial decompression, distal clavicle resection, rotator cuff repair;  Surgeon: Francena Hanly, MD;  Location: [facility] ORS;  Service: Orthopedics;  Laterality: Left;   . WRIST SURGERY Left    work injury    Family Psychiatric History: see below  Family History:  Family History  Problem Relation Age of Onset  . Alcohol abuse Brother   . Drug abuse Other   . Stroke Mother   . Hypertension Mother   . Diabetes Mother   . Kidney disease Mother    . Lung cancer Father   . Heart attack Sister   . Lung cancer Brother     Social History:  Social History   Socioeconomic History  . Marital status: Single    Spouse name: Not on file  . Number of children: Not on file  . Years of education: Not on file  . Highest education level: Not on file  Occupational History  . Not on file  Tobacco Use  . Smoking status: Never Smoker  . Smokeless tobacco: Never Used  Vaping Use  . Vaping Use: Never used  Substance and Sexual Activity  . Alcohol use: No  . Drug use: No  . Sexual activity: Never  Other Topics Concern  . Not on file  Social History Narrative  . Not on file  Social Determinants of Health   Financial Resource Strain:   . Difficulty of Paying Living Expenses: Not on file  Food Insecurity:   . Worried About Programme researcher, broadcasting/film/videounning Out of Food in the Last Year: Not on file  . Ran Out of Food in the Last Year: Not on file  Transportation Needs:   . Lack of Transportation (Medical): Not on file  . Lack of Transportation (Non-Medical): Not on file  Physical Activity:   . Days of Exercise per Week: Not on file  . Minutes of Exercise per Session: Not on file  Stress:   . Feeling of Stress : Not on file  Social Connections:   . Frequency of Communication with Friends and Family: Not on file  . Frequency of Social Gatherings with Friends and Family: Not on file  . Attends Religious Services: Not on file  . Active Member of Clubs or Organizations: Not on file  . Attends BankerClub or Organization Meetings: Not on file  . Marital Status: Not on file    Allergies:  Allergies  Allergen Reactions  . Bacitracin Rash  . Adhesive [Tape] Other (See Comments)    Makes skin pull off *Band-aids and Plastic tape" pls use paper tape  . Codeine Hives and Itching  . Macrobid [Nitrofurantoin Monohyd Macro] Hives and Itching  . Neosporin [Neomycin-Bacitracin Zn-Polymyx] Rash    Metabolic Disorder Labs: Lab Results  Component Value Date    HGBA1C 6.1 (H) 05/04/2020   MPG 128 05/04/2020   MPG 137 11/25/2016   No results found for: PROLACTIN No results found for: CHOL, TRIG, HDL, CHOLHDL, VLDL, LDLCALC No results found for: TSH  Therapeutic Level Labs: No results found for: LITHIUM No results found for: VALPROATE No components found for:  CBMZ  Current Medications: Current Outpatient Medications  Medication Sig Dispense Refill  . albuterol (PROVENTIL HFA;VENTOLIN HFA) 108 (90 Base) MCG/ACT inhaler Inhale 2 puffs into the lungs every 4 (four) hours as needed for wheezing or shortness of breath.    . allopurinol (ZYLOPRIM) 100 MG tablet Take 100 mg by mouth daily with breakfast.     . ALPRAZolam (XANAX) 1 MG tablet Take 1 tablet (1 mg total) by mouth 3 (three) times daily as needed for anxiety. 90 tablet 2  . Ascorbic Acid (VITAMIN C) 1000 MG tablet Take 1,000 mg by mouth daily.    . brimonidine (ALPHAGAN) 0.2 % ophthalmic solution Place 1 drop into the right eye in the morning, at noon, and at bedtime.    . Cholecalciferol (VITAMIN D-3) 5000 units TABS Take 5,000 Units by mouth daily.    . cyclobenzaprine (FLEXERIL) 10 MG tablet Take 1 tablet (10 mg total) by mouth 3 (three) times daily as needed for muscle spasms. 30 tablet 1  . dorzolamide-timolol (COSOPT) 22.3-6.8 MG/ML ophthalmic solution Place 1 drop into the right eye in the morning and at bedtime.    . DULoxetine (CYMBALTA) 30 MG capsule Take 1 capsule (30 mg total) by mouth daily. 90 capsule 2  . DULoxetine (CYMBALTA) 60 MG capsule Take 1 capsule (60 mg total) by mouth daily. 90 capsule 2  . folic acid (FOLVITE) 1 MG tablet Take 1 mg by mouth 2 (two) times daily.    . furosemide (LASIX) 20 MG tablet Take 20 mg by mouth daily.    Marland Kitchen. glipiZIDE (GLUCOTROL XL) 2.5 MG 24 hr tablet Take 2.5 mg by mouth daily with breakfast.    . hydrOXYzine (VISTARIL) 100 MG capsule Take 1 capsule (100 mg  total) by mouth at bedtime. 90 capsule 2  . levocetirizine (XYZAL) 5 MG tablet Take 5  mg by mouth at bedtime.    Marland Kitchen linaclotide (LINZESS) 290 MCG CAPS capsule Take 290 mcg by mouth daily as needed (constipation).     . metFORMIN (GLUCOPHAGE-XR) 500 MG 24 hr tablet Take 500 mg by mouth daily with supper.    . metoprolol succinate (TOPROL-XL) 100 MG 24 hr tablet Take 100 mg by mouth daily. Take with or immediately following a meal.    . ondansetron (ZOFRAN) 4 MG tablet Take 4 mg by mouth every 8 (eight) hours as needed for nausea or vomiting.    Marland Kitchen oxyCODONE-acetaminophen (PERCOCET) 10-325 MG tablet Take 1 tablet by mouth every 6 (six) hours as needed for pain. 28 tablet 0  . OXYGEN Inhale 2 L into the lungs at bedtime.    . pantoprazole (PROTONIX) 40 MG tablet Take 40 mg by mouth 2 (two) times daily.     . polyethylene glycol (MIRALAX / GLYCOLAX) 17 g packet Take 17 g by mouth 4 (four) times a week.     . potassium chloride (K-DUR) 10 MEQ tablet Take 10 mEq by mouth 3 (three) times daily.     . prednisoLONE acetate (PRED FORTE) 1 % ophthalmic suspension Place 1 drop into the right eye in the morning and at bedtime.     Marland Kitchen spironolactone-hydrochlorothiazide (ALDACTAZIDE) 25-25 MG tablet Take 1 tablet by mouth daily.    Marland Kitchen zinc gluconate 50 MG tablet Take 50 mg by mouth daily.    Marland Kitchen zolpidem (AMBIEN) 10 MG tablet Take 1 tablet (10 mg total) by mouth at bedtime. 90 tablet 2   No current facility-administered medications for this visit.     Musculoskeletal: Strength & Muscle Tone: within normal limits Gait & Station: normal Patient leans: N/A  Psychiatric Specialty Exam: Review of Systems  Eyes: Positive for visual disturbance.  Musculoskeletal: Positive for arthralgias and myalgias.  Psychiatric/Behavioral: Positive for dysphoric mood and sleep disturbance.  All other systems reviewed and are negative.   There were no vitals taken for this visit.There is no height or weight on file to calculate BMI.  General Appearance: NA  Eye Contact:  NA  Speech:  Clear and Coherent   Volume:  Normal  Mood:  Depressed  Affect:  NA  Thought Process:  Goal Directed  Orientation:  Full (Time, Place, and Person)  Thought Content: Rumination   Suicidal Thoughts:  No  Homicidal Thoughts:  No  Memory:  Immediate;   Good Recent;   Good Remote;   Good  Judgement:  Good  Insight:  Fair  Psychomotor Activity:  Decreased  Concentration:  Concentration: Good and Attention Span: Good  Recall:  Good  Fund of Knowledge: Good  Language: Good  Akathisia:  No  Handed:  Right  AIMS (if indicated): not done  Assets:  Communication Skills Desire for Improvement Resilience Social Support Talents/Skills  ADL's:  Intact  Cognition: WNL  Sleep:  Poor   Screenings:   Assessment and Plan: Patient is a 54 year old female with a history of chronic dysthymia depression insomnia.  She has gotten more depressed because of recent losses.  We will increase Cymbalta to 90 mg daily.  She will continue hydroxyzine 100 mg as well as Ambien 10 mg at bedtime for sleep.  We will also add cyclobenzaprine 10 mg as needed for muscle spasms.  She will continue Xanax 1 mg up to 3 times daily for anxiety and  Cymbalta 60 mg daily for depression.  She will return to see me in 6 weeks   [name], MD 08/17/2020, 3:55 PM

## 2020-09-30 ENCOUNTER — Telehealth (HOSPITAL_COMMUNITY): Payer: Medicare HMO | Admitting: Psychiatry

## 2020-10-14 ENCOUNTER — Telehealth (INDEPENDENT_AMBULATORY_CARE_PROVIDER_SITE_OTHER): Payer: Medicare HMO | Admitting: Psychiatry

## 2020-10-14 ENCOUNTER — Encounter (HOSPITAL_COMMUNITY): Payer: Self-pay | Admitting: Psychiatry

## 2020-10-14 ENCOUNTER — Other Ambulatory Visit: Payer: Self-pay

## 2020-10-14 DIAGNOSIS — F332 Major depressive disorder, recurrent severe without psychotic features: Secondary | ICD-10-CM | POA: Diagnosis not present

## 2020-10-14 MED ORDER — DULOXETINE HCL 60 MG PO CPEP: 60.0000 mg | ORAL_CAPSULE | Freq: Every day | ORAL | 2 refills | Status: DC

## 2020-10-14 MED ORDER — ZOLPIDEM TARTRATE 10 MG PO TABS: 10.0000 mg | ORAL_TABLET | Freq: Every day | ORAL | 2 refills | Status: DC

## 2020-10-14 MED ORDER — HYDROXYZINE PAMOATE 100 MG PO CAPS: 100.0000 mg | ORAL_CAPSULE | Freq: Every day | ORAL | 2 refills | Status: DC

## 2020-10-14 MED ORDER — CYCLOBENZAPRINE HCL 10 MG PO TABS: 10.0000 mg | ORAL_TABLET | Freq: Three times a day (TID) | ORAL | 2 refills | Status: DC | PRN

## 2020-10-14 MED ORDER — DULOXETINE HCL 30 MG PO CPEP: 30.0000 mg | ORAL_CAPSULE | Freq: Every day | ORAL | 2 refills | Status: DC

## 2020-10-14 MED ORDER — CYCLOBENZAPRINE HCL 10 MG PO TABS: 10.0000 mg | ORAL_TABLET | Freq: Three times a day (TID) | ORAL | 1 refills | Status: DC | PRN

## 2020-10-14 MED ORDER — ALPRAZOLAM 1 MG PO TABS: 1.0000 mg | ORAL_TABLET | Freq: Three times a day (TID) | ORAL | 2 refills | Status: DC | PRN

## 2020-10-14 NOTE — Progress Notes (Signed)
Virtual Visit via Telephone Note  I connected with [name] on 10/14/20 at  2:00 PM EST by telephone and verified that I am speaking with the correct person using two identifiers.  Location: Patient: home Provider: office   I discussed the limitations, risks, security and privacy concerns of performing an evaluation and management service by telephone and the availability of in person appointments. I also discussed with the patient that there may be a patient responsible charge related to this service. The patient expressed understanding and agreed to proceed.     I discussed the assessment and treatment plan with the patient. The patient was provided an opportunity to ask questions and all were answered. The patient agreed with the plan and demonstrated an understanding of the instructions.   The patient was advised to call back or seek an in-person evaluation if the symptoms worsen or if the condition fails to improve as anticipated.  I provided 15 minutes of non-face-to-face time during this encounter.   [name], MD  [facility] MD/PA/NP OP Progress Note  10/14/2020 2:22 PM [name]  MRN:  [id number]  Chief Complaint:  Chief Complaint    Depression; Anxiety; Follow-up     HPI: this patient is a 54year-old divorced white female who lives alone in [place]. She has no children and is unemployed. She is applying for disability.  The patient was returned referred by Sharyn Dross, her therapist, for further assessment and treatment of depression and anxiety.  The patient is a very poor historian made virtually no eye contact and answered questions in monosyllables. She did relate that she's been depressed for at least 17 years. In the intervening time she has gone through a divorce her sister died in her father died. She's been in therapy most of this time and is either seen psychiatrist or been treated by her primary doctor for depression. She's been on  numerous antidepressants but doesn't remember any of the names except Zoloft. She's currently on a combination of Cymbalta and Wellbutrin which she states isn't working. Xanax is given at night and she states it helps a little bit  The patient returns after 2 months.  She states that the holidays have been difficult for her.  She lost one of her friends who is 82 years old and she states this woman always spend holidays with her.  She is hoping to possibly get together with her brother.  Her health has been fairly stable and actually her diabetic control is very good.  Her vision has been up and down.  She states she is sleeping a little bit better since I allowed her to get some Flexeril.  I again mentioned that it would be better if her primary care or orthopedic prescribed this for her shoulder pain.  She is also going to physical therapy.  She denies suicidal ideation but his usual status to see things in a very negative light.  She continues to work on this with her therapist. Visit Diagnosis:    ICD-10-CM   1. 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.2     Past Psychiatric History: Long-term outpatient treatment  Past Medical History:  Past Medical History:  Diagnosis Date  . Anxiety   . Arthritis   . Asthma   . Chronic kidney disease    IGA  . Complication of anesthesia   . Depression   . Diabetes mellitus without complication (HCC)    type II   .  GERD (gastroesophageal reflux disease)   . Heart murmur    since birth- no need to be concern  . History of hiatal hernia   . History of kidney stones    hx of   . Hypertension   . PONV (postoperative nausea and vomiting)   . Shortness of breath    With exertion  . Sleep apnea    severe sleep apnea uses oxygen 2l at nite     Past Surgical History:  Procedure Laterality Date  . ABDOMINAL HYSTERECTOMY    . BREAST SURGERY Right    biospy x2  . CATARACT EXTRACTION     right eye  .  devaited spetum surgery     . DILATION AND CURETTAGE OF UTERUS    . ESOPHAGOGASTRODUODENOSCOPY (EGD) WITH PROPOFOL N/A 12/26/2016   Procedure: ESOPHAGOGASTRODUODENOSCOPY (EGD) WITH PROPOFOL;  Surgeon: Luretha Murphy, MD;  Location: [facility];  Service: General;  Laterality: N/A;  . ESOPHAGOGASTRODUODENOSCOPY (EGD) WITH PROPOFOL N/A 12/07/2017   Procedure: ESOPHAGOGASTRODUODENOSCOPY (EGD) WITH PROPOFOL;  Surgeon: Carman Ching, MD;  Location: [facility];  Service: Endoscopy;  Laterality: N/A;  . EYE SURGERY  04/23/2019   right,Pupil is dilated  . LAPAROSCOPIC NISSEN FUNDOPLICATION N/A 01/16/2017   Procedure: LAPAROSCOPIC ENTEROLYSIS TAKEDOWN OF PRIOR NISSEN FUNDOPLICATION WITH UPPER ENDOSCOPY;  Surgeon: Luretha Murphy, MD;  Location: [facility];  Service: General;  Laterality: N/A;  . LUMBAR LAMINECTOMY/DECOMPRESSION MICRODISCECTOMY  10/23/2013   L4 L5   DR Shon Baton  . LUMBAR LAMINECTOMY/DECOMPRESSION MICRODISCECTOMY N/A 10/23/2013   Procedure: DECOMPRESSION AND FACET REMOVAL L4 - L5 1 LEVEL;  Surgeon: Venita Lick, MD;  Location: [facility] OR;  Service: Orthopedics;  Laterality: N/A;  . nasal repair collapse      both sides of nose and 2 plates added   . NASAL SINUS SURGERY    . NISSEN FUNDOPLICATION    . right foot surgery     . SEPTOPLASTY  2013  . SHOULDER ARTHROSCOPY WITH ROTATOR CUFF REPAIR Right    x 2  . SHOULDER ARTHROSCOPY WITH ROTATOR CUFF REPAIR Left 05/07/2020   Procedure: Left shoulder arthroscopy, subacromial decompression, distal clavicle resection, rotator cuff repair;  Surgeon: Francena Hanly, MD;  Location: [facility];  Service: Orthopedics;  Laterality: Left;   . WRIST SURGERY Left    work injury    Family Psychiatric History: see below  Family History:  Family History  Problem Relation Age of Onset  . Alcohol abuse Brother   . Drug abuse Other   . Stroke Mother   . Hypertension Mother   . Diabetes Mother   . Kidney disease Mother   . Lung cancer Father   . Heart  attack Sister   . Lung cancer Brother     Social History:  Social History   Socioeconomic History  . Marital status: Single    Spouse name: Not on file  . Number of children: Not on file  . Years of education: Not on file  . Highest education level: Not on file  Occupational History  . Not on file  Tobacco Use  . Smoking status: Never Smoker  . Smokeless tobacco: Never Used  Vaping Use  . Vaping Use: Never used  Substance and Sexual Activity  . Alcohol use: No  . Drug use: No  . Sexual activity: Never  Other Topics Concern  . Not on file  Social History Narrative  . Not on file   Social Determinants of Health   Financial Resource Strain:   .  Difficulty of Paying Living Expenses: Not on file  Food Insecurity:   . Worried About Programme researcher, broadcasting/film/videounning Out of Food in the Last Year: Not on file  . Ran Out of Food in the Last Year: Not on file  Transportation Needs:   . Lack of Transportation (Medical): Not on file  . Lack of Transportation (Non-Medical): Not on file  Physical Activity:   . Days of Exercise per Week: Not on file  . Minutes of Exercise per Session: Not on file  Stress:   . Feeling of Stress : Not on file  Social Connections:   . Frequency of Communication with Friends and Family: Not on file  . Frequency of Social Gatherings with Friends and Family: Not on file  . Attends Religious Services: Not on file  . Active Member of Clubs or Organizations: Not on file  . Attends BankerClub or Organization Meetings: Not on file  . Marital Status: Not on file    Allergies:  Allergies  Allergen Reactions  . Bacitracin Rash  . Adhesive [Tape] Other (See Comments)    Makes skin pull off *Band-aids and Plastic tape" pls use paper tape  . Codeine Hives and Itching  . Macrobid [Nitrofurantoin Monohyd Macro] Hives and Itching  . Neosporin [Neomycin-Bacitracin Zn-Polymyx] Rash    Metabolic Disorder Labs: Lab Results  Component Value Date   HGBA1C 6.1 (H) 05/04/2020   MPG 128  05/04/2020   MPG 137 11/25/2016   No results found for: PROLACTIN No results found for: CHOL, TRIG, HDL, CHOLHDL, VLDL, LDLCALC No results found for: TSH  Therapeutic Level Labs: No results found for: LITHIUM No results found for: VALPROATE No components found for:  CBMZ  Current Medications: Current Outpatient Medications  Medication Sig Dispense Refill  . albuterol (PROVENTIL HFA;VENTOLIN HFA) 108 (90 Base) MCG/ACT inhaler Inhale 2 puffs into the lungs every 4 (four) hours as needed for wheezing or shortness of breath.    . allopurinol (ZYLOPRIM) 100 MG tablet Take 100 mg by mouth daily with breakfast.     . ALPRAZolam (XANAX) 1 MG tablet Take 1 tablet (1 mg total) by mouth 3 (three) times daily as needed for anxiety. 90 tablet 2  . Ascorbic Acid (VITAMIN C) 1000 MG tablet Take 1,000 mg by mouth daily.    . brimonidine (ALPHAGAN) 0.2 % ophthalmic solution Place 1 drop into the right eye in the morning, at noon, and at bedtime.    . Cholecalciferol (VITAMIN D-3) 5000 units TABS Take 5,000 Units by mouth daily.    . cyclobenzaprine (FLEXERIL) 10 MG tablet Take 1 tablet (10 mg total) by mouth 3 (three) times daily as needed for muscle spasms. 30 tablet 2  . dorzolamide-timolol (COSOPT) 22.3-6.8 MG/ML ophthalmic solution Place 1 drop into the right eye in the morning and at bedtime.    . DULoxetine (CYMBALTA) 30 MG capsule Take 1 capsule (30 mg total) by mouth daily. 90 capsule 2  . DULoxetine (CYMBALTA) 60 MG capsule Take 1 capsule (60 mg total) by mouth daily. 90 capsule 2  . folic acid (FOLVITE) 1 MG tablet Take 1 mg by mouth 2 (two) times daily.    . furosemide (LASIX) 20 MG tablet Take 20 mg by mouth daily.    Marland Kitchen. glipiZIDE (GLUCOTROL XL) 2.5 MG 24 hr tablet Take 2.5 mg by mouth daily with breakfast.    . hydrOXYzine (VISTARIL) 100 MG capsule Take 1 capsule (100 mg total) by mouth at bedtime. 90 capsule 2  . levocetirizine (XYZAL)  5 MG tablet Take 5 mg by mouth at bedtime.    Marland Kitchen  linaclotide (LINZESS) 290 MCG CAPS capsule Take 290 mcg by mouth daily as needed (constipation).     . metFORMIN (GLUCOPHAGE-XR) 500 MG 24 hr tablet Take 500 mg by mouth daily with supper.    . metoprolol succinate (TOPROL-XL) 100 MG 24 hr tablet Take 100 mg by mouth daily. Take with or immediately following a meal.    . ondansetron (ZOFRAN) 4 MG tablet Take 4 mg by mouth every 8 (eight) hours as needed for nausea or vomiting.    Marland Kitchen oxyCODONE-acetaminophen (PERCOCET) 10-325 MG tablet Take 1 tablet by mouth every 6 (six) hours as needed for pain. 28 tablet 0  . OXYGEN Inhale 2 L into the lungs at bedtime.    . pantoprazole (PROTONIX) 40 MG tablet Take 40 mg by mouth 2 (two) times daily.     . polyethylene glycol (MIRALAX / GLYCOLAX) 17 g packet Take 17 g by mouth 4 (four) times a week.     . potassium chloride (K-DUR) 10 MEQ tablet Take 10 mEq by mouth 3 (three) times daily.     . prednisoLONE acetate (PRED FORTE) 1 % ophthalmic suspension Place 1 drop into the right eye in the morning and at bedtime.     Marland Kitchen spironolactone-hydrochlorothiazide (ALDACTAZIDE) 25-25 MG tablet Take 1 tablet by mouth daily.    Marland Kitchen zinc gluconate 50 MG tablet Take 50 mg by mouth daily.    Marland Kitchen zolpidem (AMBIEN) 10 MG tablet Take 1 tablet (10 mg total) by mouth at bedtime. 90 tablet 2   No current facility-administered medications for this visit.     Musculoskeletal: Strength & Muscle Tone: within normal limits Gait & Station: normal Patient leans: N/A  Psychiatric Specialty Exam: Review of Systems  Musculoskeletal: Positive for arthralgias and myalgias.  Psychiatric/Behavioral: Positive for dysphoric mood.  All other systems reviewed and are negative.   There were no vitals taken for this visit.There is no height or weight on file to calculate BMI.  General Appearance: NA  Eye Contact:  NA  Speech:  Clear and Coherent  Volume:  Normal  Mood:  Dysphoric  Affect:  NA  Thought Process:  Goal Directed  Orientation:   Full (Time, Place, and Person)  Thought Content: Rumination   Suicidal Thoughts:  No  Homicidal Thoughts:  No  Memory:  Immediate;   Good Recent;   Good Remote;   Good  Judgement:  Good  Insight:  Fair  Psychomotor Activity:  Decreased  Concentration:  Concentration: Good and Attention Span: Good  Recall:  Good  Fund of Knowledge: Good  Language: Good  Akathisia:  No  Handed:  right  AIMS (if indicated): not done  Assets:  Communication Skills Desire for Improvement Resilience Social Support Talents/Skills  ADL's:  Intact  Cognition: WNL  Sleep:  Fair   Screenings:   Assessment and Plan: This patient is a 54 year old female with a history of chronic dysthymia depression and insomnia.  She seems to be doing a little bit better than last visit.  She will continue Cymbalta 90 mg daily for depression, hydroxyzine 100 mg as well as Ambien 10 mg at bedtime for sleep.  She will continue Xanax 1 mg up to 3 times daily as needed for anxiety and cyclobenzaprine 10 mg as needed for muscle spasm.  She will return to see me in 3 months at her request   [name], MD 10/14/2020, 2:22 PM

## 2021-01-12 ENCOUNTER — Other Ambulatory Visit: Payer: Self-pay

## 2021-01-12 ENCOUNTER — Encounter (HOSPITAL_COMMUNITY): Payer: Self-pay | Admitting: Psychiatry

## 2021-01-12 ENCOUNTER — Encounter (HOSPITAL_COMMUNITY): Payer: Self-pay

## 2021-01-12 ENCOUNTER — Telehealth (INDEPENDENT_AMBULATORY_CARE_PROVIDER_SITE_OTHER): Payer: Medicare HMO | Admitting: Psychiatry

## 2021-01-12 DIAGNOSIS — F332 Major depressive disorder, recurrent severe without psychotic features: Secondary | ICD-10-CM | POA: Diagnosis not present

## 2021-01-12 MED ORDER — HYDROXYZINE PAMOATE 100 MG PO CAPS: 100.0000 mg | ORAL_CAPSULE | Freq: Every day | ORAL | 2 refills | Status: DC

## 2021-01-12 MED ORDER — ZOLPIDEM TARTRATE 10 MG PO TABS: 10.0000 mg | ORAL_TABLET | Freq: Every day | ORAL | 2 refills | Status: DC

## 2021-01-12 MED ORDER — ALPRAZOLAM 1 MG PO TABS: 1.0000 mg | ORAL_TABLET | Freq: Three times a day (TID) | ORAL | 2 refills | Status: DC | PRN

## 2021-01-12 MED ORDER — DULOXETINE HCL 60 MG PO CPEP: 60.0000 mg | ORAL_CAPSULE | Freq: Two times a day (BID) | ORAL | 2 refills | Status: DC

## 2021-01-12 NOTE — Progress Notes (Signed)
Virtual Visit via Telephone Note  I connected with Marcelino ScotPenny L Moynahan on 01/12/21 at  2:40 PM EST by telephone and verified that I am speaking with the correct person using two identifiers.  Location: Patient: home Provider: home   I discussed the limitations, risks, security and privacy concerns of performing an evaluation and management service by telephone and the availability of in person appointments. I also discussed with the patient that there may be a patient responsible charge related to this service. The patient expressed understanding and agreed to proceed.   I discussed the assessment and treatment plan with the patient. The patient was provided an opportunity to ask questions and all were answered. The patient agreed with the plan and demonstrated an understanding of the instructions.   The patient was advised to call back or seek an in-person evaluation if the symptoms worsen or if the condition fails to improve as anticipated.  I provided 15 minutes of non-face-to-face time during this encounter.   Diannia Rudereborah Macon Sandiford, MD  [facility]/NP OP Progress Note  01/12/2021 3:07 PM Marcelino Scotenny L Wey  MRN:  [id number]  Chief Complaint:  Chief Complaint    Depression; Anxiety; Follow-up     HPI: this patient is a 55year-old divorced white female who lives alone in [place]. She has no children and is unemployed. She is applying for disability.  The patient was returned referred by Sharyn Drossonstance Fletcher, her therapist, for further assessment and treatment of depression and anxiety.  The patient is a very poor historian made virtually no eye contact and answered questions in monosyllables. She did relate that she's been depressed for at least 17 years. In the intervening time she has gone through a divorce her sister died in her father died. She's been in therapy most of this time and is either seen psychiatrist or been treated by her primary doctor for depression. She's been on numerous  antidepressants but doesn't remember any of the names except Zoloft. She's currently on a combination of Cymbalta and Wellbutrin which she states isn't working. Xanax is given at night and she states it helps a little bit  The patient returns to follow-up after 3 months.  She states that she has been having a lot more pain.  For some reason her neck stiffened up and got very painful.  She is not sure why.  She is going to a [facility].  She is still going to physical therapy for her shoulder.  She feels more depressed and sad.  She still misses her 57 year old friend who died several months ago.  She states they were very close.  She is only talking to one of her brothers and the other one will speak to her.  She feels alone and isolated.  The most part she is sleeping okay but sometimes the pain wakes her up.  She denies suicidal ideation but does endorse anhedonia low mood low energy.  I suggest we keep going up on the Cymbalta to 120 mg. Visit Diagnosis:    ICD-10-CM   1. 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.2     Past Psychiatric History: Long-term outpatient treatment  Past Medical History:  Past Medical History:  Diagnosis Date  . Anxiety   . Arthritis   . Asthma   . Chronic kidney disease    IGA  . Complication of anesthesia   . Depression   . Diabetes mellitus without complication (HCC)    type II   .  GERD (gastroesophageal reflux disease)   . Heart murmur    since birth- no need to be concern  . History of hiatal hernia   . History of kidney stones    hx of   . Hypertension   . PONV (postoperative nausea and vomiting)   . Shortness of breath    With exertion  . Sleep apnea    severe sleep apnea uses oxygen 2l at nite     Past Surgical History:  Procedure Laterality Date  . ABDOMINAL HYSTERECTOMY    . BREAST SURGERY Right    biospy x2  . CATARACT EXTRACTION     right eye  . devaited spetum surgery     . DILATION AND  CURETTAGE OF UTERUS    . ESOPHAGOGASTRODUODENOSCOPY (EGD) WITH PROPOFOL N/A 12/26/2016   Procedure: ESOPHAGOGASTRODUODENOSCOPY (EGD) WITH PROPOFOL;  Surgeon: Luretha Murphy, MD;  Location: [facility] ENDOSCOPY;  Service: General;  Laterality: N/A;  . ESOPHAGOGASTRODUODENOSCOPY (EGD) WITH PROPOFOL N/A 12/07/2017   Procedure: ESOPHAGOGASTRODUODENOSCOPY (EGD) WITH PROPOFOL;  Surgeon: Carman Ching, MD;  Location: [facility] ENDOSCOPY;  Service: Endoscopy;  Laterality: N/A;  . EYE SURGERY  04/23/2019   right,Pupil is dilated  . LAPAROSCOPIC NISSEN FUNDOPLICATION N/A 01/16/2017   Procedure: LAPAROSCOPIC ENTEROLYSIS TAKEDOWN OF PRIOR NISSEN FUNDOPLICATION WITH UPPER ENDOSCOPY;  Surgeon: Luretha Murphy, MD;  Location: [facility] ORS;  Service: General;  Laterality: N/A;  . LUMBAR LAMINECTOMY/DECOMPRESSION MICRODISCECTOMY  10/23/2013   L4 L5   DR Shon Baton  . LUMBAR LAMINECTOMY/DECOMPRESSION MICRODISCECTOMY N/A 10/23/2013   Procedure: DECOMPRESSION AND FACET REMOVAL L4 - L5 1 LEVEL;  Surgeon: Venita Lick, MD;  Location: [facility] OR;  Service: Orthopedics;  Laterality: N/A;  . nasal repair collapse      both sides of nose and 2 plates added   . NASAL SINUS SURGERY    . NISSEN FUNDOPLICATION    . right foot surgery     . SEPTOPLASTY  2013  . SHOULDER ARTHROSCOPY WITH ROTATOR CUFF REPAIR Right    x 2  . SHOULDER ARTHROSCOPY WITH ROTATOR CUFF REPAIR Left 05/07/2020   Procedure: Left shoulder arthroscopy, subacromial decompression, distal clavicle resection, rotator cuff repair;  Surgeon: Francena Hanly, MD;  Location: [facility] ORS;  Service: Orthopedics;  Laterality: Left;   . WRIST SURGERY Left    work injury    Family Psychiatric History: see below  Family History:  Family History  Problem Relation Age of Onset  . Alcohol abuse Brother   . Drug abuse Other   . Stroke Mother   . Hypertension Mother   . Diabetes Mother   . Kidney disease Mother   . Lung cancer Father   . Heart attack Sister   . Lung cancer Brother      Social History:  Social History   Socioeconomic History  . Marital status: Single    Spouse name: Not on file  . Number of children: Not on file  . Years of education: Not on file  . Highest education level: Not on file  Occupational History  . Not on file  Tobacco Use  . Smoking status: Never Smoker  . Smokeless tobacco: Never Used  Vaping Use  . Vaping Use: Never used  Substance and Sexual Activity  . Alcohol use: No  . Drug use: No  . Sexual activity: Never  Other Topics Concern  . Not on file  Social History Narrative  . Not on file   Social Determinants of Health   Financial Resource Strain: Not on file  Food Insecurity: Not on file  Transportation Needs: Not on file  Physical Activity: Not on file  Stress: Not on file  Social Connections: Not on file    Allergies:  Allergies  Allergen Reactions  . Bacitracin Rash  . Adhesive [Tape] Other (See Comments)    Makes skin pull off *Band-aids and Plastic tape" pls use paper tape  . Codeine Hives and Itching  . Macrobid [Nitrofurantoin Monohyd Macro] Hives and Itching  . Neosporin [Neomycin-Bacitracin Zn-Polymyx] Rash    Metabolic Disorder Labs: Lab Results  Component Value Date   HGBA1C 6.1 (H) 05/04/2020   MPG 128 05/04/2020   MPG 137 11/25/2016   No results found for: PROLACTIN No results found for: CHOL, TRIG, HDL, CHOLHDL, VLDL, LDLCALC No results found for: TSH  Therapeutic Level Labs: No results found for: LITHIUM No results found for: VALPROATE No components found for:  CBMZ  Current Medications: Current Outpatient Medications  Medication Sig Dispense Refill  . albuterol (PROVENTIL HFA;VENTOLIN HFA) 108 (90 Base) MCG/ACT inhaler Inhale 2 puffs into the lungs every 4 (four) hours as needed for wheezing or shortness of breath.    . allopurinol (ZYLOPRIM) 100 MG tablet Take 100 mg by mouth daily with breakfast.     . ALPRAZolam (XANAX) 1 MG tablet Take 1 tablet (1 mg total) by mouth 3  (three) times daily as needed for anxiety. 90 tablet 2  . Ascorbic Acid (VITAMIN C) 1000 MG tablet Take 1,000 mg by mouth daily.    . brimonidine (ALPHAGAN) 0.2 % ophthalmic solution Place 1 drop into the right eye in the morning, at noon, and at bedtime.    . Cholecalciferol (VITAMIN D-3) 5000 units TABS Take 5,000 Units by mouth daily.    . cyclobenzaprine (FLEXERIL) 10 MG tablet Take 1 tablet (10 mg total) by mouth 3 (three) times daily as needed for muscle spasms. 30 tablet 2  . dorzolamide-timolol (COSOPT) 22.3-6.8 MG/ML ophthalmic solution Place 1 drop into the right eye in the morning and at bedtime.    . DULoxetine (CYMBALTA) 60 MG capsule Take 1 capsule (60 mg total) by mouth 2 (two) times daily. 360 capsule 2  . folic acid (FOLVITE) 1 MG tablet Take 1 mg by mouth 2 (two) times daily.    . furosemide (LASIX) 20 MG tablet Take 20 mg by mouth daily.    Marland Kitchen glipiZIDE (GLUCOTROL XL) 2.5 MG 24 hr tablet Take 2.5 mg by mouth daily with breakfast.    . hydrOXYzine (VISTARIL) 100 MG capsule Take 1 capsule (100 mg total) by mouth at bedtime. 90 capsule 2  . levocetirizine (XYZAL) 5 MG tablet Take 5 mg by mouth at bedtime.    Marland Kitchen linaclotide (LINZESS) 290 MCG CAPS capsule Take 290 mcg by mouth daily as needed (constipation).     . metFORMIN (GLUCOPHAGE-XR) 500 MG 24 hr tablet Take 500 mg by mouth daily with supper.    . metoprolol succinate (TOPROL-XL) 100 MG 24 hr tablet Take 100 mg by mouth daily. Take with or immediately following a meal.    . ondansetron (ZOFRAN) 4 MG tablet Take 4 mg by mouth every 8 (eight) hours as needed for nausea or vomiting.    Marland Kitchen oxyCODONE-acetaminophen (PERCOCET) 10-325 MG tablet Take 1 tablet by mouth every 6 (six) hours as needed for pain. 28 tablet 0  . OXYGEN Inhale 2 L into the lungs at bedtime.    . pantoprazole (PROTONIX) 40 MG tablet Take 40 mg by mouth 2 (two) times  daily.     . polyethylene glycol (MIRALAX / GLYCOLAX) 17 g packet Take 17 g by mouth 4 (four) times  a week.     . potassium chloride (K-DUR) 10 MEQ tablet Take 10 mEq by mouth 3 (three) times daily.     . prednisoLONE acetate (PRED FORTE) 1 % ophthalmic suspension Place 1 drop into the right eye in the morning and at bedtime.     Marland Kitchen spironolactone-hydrochlorothiazide (ALDACTAZIDE) 25-25 MG tablet Take 1 tablet by mouth daily.    Marland Kitchen zinc gluconate 50 MG tablet Take 50 mg by mouth daily.    Marland Kitchen zolpidem (AMBIEN) 10 MG tablet Take 1 tablet (10 mg total) by mouth at bedtime. 90 tablet 2   No current facility-administered medications for this visit.     Musculoskeletal: Strength & Muscle Tone: decreased Gait & Station: normal Patient leans: N/A  Psychiatric Specialty Exam: Review of Systems  Constitutional: Positive for fatigue.  Musculoskeletal: Positive for arthralgias, back pain, myalgias, neck pain and neck stiffness.  Psychiatric/Behavioral: Positive for dysphoric mood. The patient is nervous/anxious.     There were no vitals taken for this visit.There is no height or weight on file to calculate BMI.  General Appearance: NA  Eye Contact:  NA  Speech:  Clear and Coherent  Volume:  Normal  Mood:  Depressed  Affect:  NA  Thought Process:  Goal Directed  Orientation:  Full (Time, Place, and Person)  Thought Content: Rumination   Suicidal Thoughts:  No  Homicidal Thoughts:  No  Memory:  Immediate;   Good Recent;   Good Remote;   Fair  Judgement:  Good  Insight:  Fair  Psychomotor Activity:  Decreased  Concentration:  Concentration: Poor and Attention Span: Poor  Recall:  Good  Fund of Knowledge: Good  Language: Good  Akathisia:  No  Handed:  Right  AIMS (if indicated): not done  Assets:  Communication Skills Desire for Improvement Resilience Social Support Talents/Skills  ADL's:  Intact  Cognition: WNL  Sleep:  Fair   Screenings: PHQ2-9   Flowsheet Row [facility]12[facility]2021 [facility]  PHQ-2 Total Score 5  PHQ-9  Total Score 19    Flowsheet Row Video Visit from 01/12/2021 in [facility]  C-SSRS RISK CATEGORY No Risk       Assessment and Plan: This patient is a 55 year old female with a history of chronic dysthymia depression and insomnia.  Because of her new pain in the neck she is having a lot more issues with feeling well and tired.  We will increase Cymbalta to 120 mg daily for depression and chronic pain.  Continued hydroxyzine 100 mg as well as Ambien 10 mg at bedtime for sleep.  She will continue Xanax 1 mg up to 3 times daily as needed.  Her primary doctor is now prescribing the cyclobenzaprine for muscle spasm.  She will return to see me in 2 months   [name], MD 01/12/2021, 3:07 PM

## 2021-01-20 ENCOUNTER — Telehealth (HOSPITAL_COMMUNITY): Payer: Self-pay | Admitting: *Deleted

## 2021-01-20 NOTE — Telephone Encounter (Signed)
Patient called to ask why she was only getting a 30 day supply of Xanax with 2 refills instead of just a 90 day supply. The December script was written for 30 days with 2 refills. Prior to that the scripts were 90 days at a time. Patient has asked why this has changed.  Please review and advise.

## 2021-01-21 ENCOUNTER — Other Ambulatory Visit (HOSPITAL_COMMUNITY): Payer: Self-pay | Admitting: Psychiatry

## 2021-01-21 MED ORDER — ALPRAZOLAM 1 MG PO TABS: 1.0000 mg | ORAL_TABLET | Freq: Three times a day (TID) | ORAL | 1 refills | Status: DC | PRN

## 2021-01-21 NOTE — Telephone Encounter (Signed)
Medication management - Telephone call with patient to inform Dr. Tenny Craw had resent in a new Alprazolam order today for a 90 day order + 1 refill to [facility].  Patient to call back if any problems with order.

## 2021-01-21 NOTE — Telephone Encounter (Signed)
Tell her I resent in 90 day quantity

## 2021-03-15 ENCOUNTER — Encounter (HOSPITAL_COMMUNITY): Payer: Self-pay | Admitting: Psychiatry

## 2021-03-15 ENCOUNTER — Telehealth (INDEPENDENT_AMBULATORY_CARE_PROVIDER_SITE_OTHER): Payer: Medicare HMO | Admitting: Psychiatry

## 2021-03-15 ENCOUNTER — Other Ambulatory Visit: Payer: Self-pay

## 2021-03-15 DIAGNOSIS — F332 Major depressive disorder, recurrent severe without psychotic features: Secondary | ICD-10-CM

## 2021-03-15 MED ORDER — HYDROXYZINE PAMOATE 100 MG PO CAPS: 100.0000 mg | ORAL_CAPSULE | Freq: Every day | ORAL | 2 refills | Status: DC

## 2021-03-15 MED ORDER — CYCLOBENZAPRINE HCL 10 MG PO TABS: 10.0000 mg | ORAL_TABLET | Freq: Every day | ORAL | 2 refills | Status: DC

## 2021-03-15 MED ORDER — ALPRAZOLAM 1 MG PO TABS: 1.0000 mg | ORAL_TABLET | Freq: Three times a day (TID) | ORAL | 1 refills | Status: DC | PRN

## 2021-03-15 MED ORDER — DULOXETINE HCL 60 MG PO CPEP: 60.0000 mg | ORAL_CAPSULE | Freq: Two times a day (BID) | ORAL | 2 refills | Status: DC

## 2021-03-15 MED ORDER — ZOLPIDEM TARTRATE 10 MG PO TABS: 10.0000 mg | ORAL_TABLET | Freq: Every day | ORAL | 2 refills | Status: DC

## 2021-03-15 NOTE — Progress Notes (Signed)
Virtual Visit via Telephone Note  I connected with [name] on 03/15/21 at  1:40 PM EDT by telephone and verified that I am speaking with the correct person using two identifiers.  Location: Patient: home Provider:home office   I discussed the limitations, risks, security and privacy concerns of performing an evaluation and management service by telephone and the availability of in person appointments. I also discussed with the patient that there may be a patient responsible charge related to this service. The patient expressed understanding and agreed to proceed.   I discussed the assessment and treatment plan with the patient. The patient was provided an opportunity to ask questions and all were answered. The patient agreed with the plan and demonstrated an understanding of the instructions.   The patient was advised to call back or seek an in-person evaluation if the symptoms worsen or if the condition fails to improve as anticipated.  I provided 15 minutes of non-face-to-face time during this encounter.   [name], MD  [facility] MD/PA/NP OP Progress Note  03/15/2021 2:11 PM [name]  MRN:  [id number]  Chief Complaint:  Chief Complaint    Anxiety; Depression; Follow-up     HPI: this patient is a 55year-old divorced white female who lives alone in [place]. She has no children and is unemployed. She is applying for disability.  The patient was returned referred by Sharyn Dross, her therapist, for further assessment and treatment of depression and anxiety.  The patient is a very poor historian made virtually no eye contact and answered questions in monosyllables. She did relate that she's been depressed for at least 17 years. In the intervening time she has gone through a divorce her sister died in her father died. She's been in therapy most of this time and is either seen psychiatrist or been treated by her primary doctor for depression. She's been on  numerous antidepressants but doesn't remember any of the names except Zoloft. She's currently on a combination of Cymbalta and Wellbutrin which she states isn't working. Xanax is given at night and she states it helps a little bit  Patient returns for follow-up after 2 months.  She is still dealing with a lot of health issues.  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  She may need another surgery.  She also was found to have elevated B12.  She states she was tested for thyroid issues and her thyroid was at the low end of normal.  She is tired all the time and has to take naps every day.  She is still somewhat depressed but is trying to get out and do more things.  She is sleeping better.  I explained that until we figure out why she is so fatigued and one of the medical causes I really do not want to make too many changes. Visit Diagnosis:    ICD-10-CM   1. 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.2     Past Psychiatric History: Long-term outpatient treatment  Past Medical History:  Past Medical History:  Diagnosis Date  . Anxiety   . Arthritis   . Asthma   . Chronic kidney disease    IGA  . Complication of anesthesia   . Depression   . Diabetes mellitus without complication (HCC)    type II   . GERD (gastroesophageal reflux disease)   . Heart murmur    since birth- no need  to be concern  . History of hiatal hernia   . History of kidney stones    hx of   . Hypertension   . PONV (postoperative nausea and vomiting)   . Shortness of breath    With exertion  . Sleep apnea    severe sleep apnea uses oxygen 2l at nite     Past Surgical History:  Procedure Laterality Date  . ABDOMINAL HYSTERECTOMY    . BREAST SURGERY Right    biospy x2  . CATARACT EXTRACTION     right eye  . devaited spetum surgery     . DILATION AND CURETTAGE OF UTERUS    . ESOPHAGOGASTRODUODENOSCOPY (EGD) WITH PROPOFOL N/A  12/26/2016   Procedure: ESOPHAGOGASTRODUODENOSCOPY (EGD) WITH PROPOFOL;  Surgeon: Luretha Murphy, MD;  Location: [facility] ENDOSCOPY;  Service: General;  Laterality: N/A;  . ESOPHAGOGASTRODUODENOSCOPY (EGD) WITH PROPOFOL N/A 12/07/2017   Procedure: ESOPHAGOGASTRODUODENOSCOPY (EGD) WITH PROPOFOL;  Surgeon: Carman Ching, MD;  Location: [facility] ENDOSCOPY;  Service: Endoscopy;  Laterality: N/A;  . EYE SURGERY  04/23/2019   right,Pupil is dilated  . LAPAROSCOPIC NISSEN FUNDOPLICATION N/A 01/16/2017   Procedure: LAPAROSCOPIC ENTEROLYSIS TAKEDOWN OF PRIOR NISSEN FUNDOPLICATION WITH UPPER ENDOSCOPY;  Surgeon: Luretha Murphy, MD;  Location: [facility] ORS;  Service: General;  Laterality: N/A;  . LUMBAR LAMINECTOMY/DECOMPRESSION MICRODISCECTOMY  10/23/2013   L4 L5   DR Shon Baton  . LUMBAR LAMINECTOMY/DECOMPRESSION MICRODISCECTOMY N/A 10/23/2013   Procedure: DECOMPRESSION AND FACET REMOVAL L4 - L5 1 LEVEL;  Surgeon: Venita Lick, MD;  Location: [facility] OR;  Service: Orthopedics;  Laterality: N/A;  . nasal repair collapse      both sides of nose and 2 plates added   . NASAL SINUS SURGERY    . NISSEN FUNDOPLICATION    . right foot surgery     . SEPTOPLASTY  2013  . SHOULDER ARTHROSCOPY WITH ROTATOR CUFF REPAIR Right    x 2  . SHOULDER ARTHROSCOPY WITH ROTATOR CUFF REPAIR Left 05/07/2020   Procedure: Left shoulder arthroscopy, subacromial decompression, distal clavicle resection, rotator cuff repair;  Surgeon: Francena Hanly, MD;  Location: [facility] ORS;  Service: Orthopedics;  Laterality: Left;   . WRIST SURGERY Left    work injury    Family Psychiatric History: see below  Family History:  Family History  Problem Relation Age of Onset  . Alcohol abuse Brother   . Drug abuse Other   . Stroke Mother   . Hypertension Mother   . Diabetes Mother   . Kidney disease Mother   . Lung cancer Father   . Heart attack Sister   . Lung cancer Brother     Social History:  Social History   Socioeconomic History  . Marital status:  Single    Spouse name: Not on file  . Number of children: Not on file  . Years of education: Not on file  . Highest education level: Not on file  Occupational History  . Not on file  Tobacco Use  . Smoking status: Never Smoker  . Smokeless tobacco: Never Used  Vaping Use  . Vaping Use: Never used  Substance and Sexual Activity  . Alcohol use: No  . Drug use: No  . Sexual activity: Never  Other Topics Concern  . Not on file  Social History Narrative  . Not on file   Social Determinants of Health   Financial Resource Strain: Not on file  Food Insecurity: Not on file  Transportation Needs: Not on file  Physical Activity: Not  on file  Stress: Not on file  Social Connections: Not on file    Allergies:  Allergies  Allergen Reactions  . Bacitracin Rash  . Adhesive [Tape] Other (See Comments)    Makes skin pull off *Band-aids and Plastic tape" pls use paper tape  . Codeine Hives and Itching  . Macrobid [Nitrofurantoin Monohyd Macro] Hives and Itching  . Neosporin [Neomycin-Bacitracin Zn-Polymyx] Rash    Metabolic Disorder Labs: Lab Results  Component Value Date   HGBA1C 6.1 (H) 05/04/2020   MPG 128 05/04/2020   MPG 137 11/25/2016   No results found for: PROLACTIN No results found for: CHOL, TRIG, HDL, CHOLHDL, VLDL, LDLCALC No results found for: TSH  Therapeutic Level Labs: No results found for: LITHIUM No results found for: VALPROATE No components found for:  CBMZ  Current Medications: Current Outpatient Medications  Medication Sig Dispense Refill  . albuterol (PROVENTIL HFA;VENTOLIN HFA) 108 (90 Base) MCG/ACT inhaler Inhale 2 puffs into the lungs every 4 (four) hours as needed for wheezing or shortness of breath.    . allopurinol (ZYLOPRIM) 100 MG tablet Take 100 mg by mouth daily with breakfast.     . ALPRAZolam (XANAX) 1 MG tablet Take 1 tablet (1 mg total) by mouth 3 (three) times daily as needed for anxiety. 270 tablet 1  . Ascorbic Acid (VITAMIN C) 1000  MG tablet Take 1,000 mg by mouth daily.    . brimonidine (ALPHAGAN) 0.2 % ophthalmic solution Place 1 drop into the right eye in the morning, at noon, and at bedtime.    . Cholecalciferol (VITAMIN D-3) 5000 units TABS Take 5,000 Units by mouth daily.    . cyclobenzaprine (FLEXERIL) 10 MG tablet Take 1 tablet (10 mg total) by mouth at bedtime. 30 tablet 2  . dorzolamide-timolol (COSOPT) 22.3-6.8 MG/ML ophthalmic solution Place 1 drop into the right eye in the morning and at bedtime.    . DULoxetine (CYMBALTA) 60 MG capsule Take 1 capsule (60 mg total) by mouth 2 (two) times daily. 360 capsule 2  . folic acid (FOLVITE) 1 MG tablet Take 1 mg by mouth 2 (two) times daily.    . furosemide (LASIX) 20 MG tablet Take 20 mg by mouth daily.    Marland Kitchen. glipiZIDE (GLUCOTROL XL) 2.5 MG 24 hr tablet Take 2.5 mg by mouth daily with breakfast.    . hydrOXYzine (VISTARIL) 100 MG capsule Take 1 capsule (100 mg total) by mouth at bedtime. 90 capsule 2  . levocetirizine (XYZAL) 5 MG tablet Take 5 mg by mouth at bedtime.    Marland Kitchen. linaclotide (LINZESS) 290 MCG CAPS capsule Take 290 mcg by mouth daily as needed (constipation).     . metFORMIN (GLUCOPHAGE-XR) 500 MG 24 hr tablet Take 500 mg by mouth daily with supper.    . metoprolol succinate (TOPROL-XL) 100 MG 24 hr tablet Take 100 mg by mouth daily. Take with or immediately following a meal.    . ondansetron (ZOFRAN) 4 MG tablet Take 4 mg by mouth every 8 (eight) hours as needed for nausea or vomiting.    Marland Kitchen. oxyCODONE-acetaminophen (PERCOCET) 10-325 MG tablet Take 1 tablet by mouth every 6 (six) hours as needed for pain. 28 tablet 0  . OXYGEN Inhale 2 L into the lungs at bedtime.    . pantoprazole (PROTONIX) 40 MG tablet Take 40 mg by mouth 2 (two) times daily.     . polyethylene glycol (MIRALAX / GLYCOLAX) 17 g packet Take 17 g by mouth 4 (four) times  a week.     . potassium chloride (K-DUR) 10 MEQ tablet Take 10 mEq by mouth 3 (three) times daily.     . prednisoLONE acetate  (PRED FORTE) 1 % ophthalmic suspension Place 1 drop into the right eye in the morning and at bedtime.     Marland Kitchen spironolactone-hydrochlorothiazide (ALDACTAZIDE) 25-25 MG tablet Take 1 tablet by mouth daily.    Marland Kitchen zinc gluconate 50 MG tablet Take 50 mg by mouth daily.    Marland Kitchen zolpidem (AMBIEN) 10 MG tablet Take 1 tablet (10 mg total) by mouth at bedtime. 90 tablet 2   No current facility-administered medications for this visit.     Musculoskeletal: Strength & Muscle Tone: within normal limits Gait & Station: normal Patient leans: N/A  Psychiatric Specialty Exam: Review of Systems  Constitutional: Positive for fatigue.  Eyes: Positive for visual disturbance.  Gastrointestinal: Positive for abdominal pain.  Psychiatric/Behavioral: Positive for dysphoric mood.  All other systems reviewed and are negative.   There were no vitals taken for this visit.There is no height or weight on file to calculate BMI.  General Appearance: NA  Eye Contact:  NA  Speech:  Clear and Coherent  Volume:  Decreased  Mood:  Dysphoric and Irritable  Affect:  NA  Thought Process:  Goal Directed  Orientation:  Full (Time, Place, and Person)  Thought Content: Rumination   Suicidal Thoughts:  No  Homicidal Thoughts:  No  Memory:  Immediate;   Good Recent;   Good Remote;   Fair  Judgement:  Good  Insight:  Fair  Psychomotor Activity:  Decreased  Concentration:  Concentration: Fair and Attention Span: Fair  Recall:  [employer] of Knowledge: Good  Language: Good  Akathisia:  No  Handed:  Right  AIMS (if indicated): not done  Assets:  Communication Skills Desire for Improvement Resilience Social Support  ADL's:  Intact  Cognition: WNL  Sleep:  Good   Screenings: PHQ2-9   Flowsheet Row [facility]15[facility]2021 [facility] Video Visit from 01/12/2021 in [facility]  PHQ-2 Total Score 5 5  PHQ-9 Total Score 13 19     Flowsheet Row Video Visit from 03/15/2021 in [facility] Video Visit from 01/12/2021 in [facility]  C-SSRS RISK CATEGORY No Risk No Risk       Assessment and Plan: Patient is a 55 year old female with a history of chronic dysthymia depression insomnia.  It is unclear why she is still fatigued but this is being worked up by primary care and endocrine.  For now we will continue Cymbalta 120 mg daily for depression, hydroxyzine 100 mg as well as Ambien 10 mg at bedtime for sleep, Xanax 1 mg up to 3 times daily as needed and cyclobenzaprine 10 mg at bedtime only as needed for muscle spasm.  She will return to see me in 2 months   [name], MD 03/15/2021, 2:11 PM

## 2021-04-27 ENCOUNTER — Telehealth (HOSPITAL_COMMUNITY): Payer: Self-pay | Admitting: *Deleted

## 2021-04-27 NOTE — Telephone Encounter (Signed)
Patient is needing refills for all of her medications provider prescribes for her. Patient appt was cancelled due to provider being on medical leave. Message was left to call office to resch appt.  

## 2021-04-29 ENCOUNTER — Other Ambulatory Visit (HOSPITAL_COMMUNITY): Payer: Self-pay | Admitting: Psychiatry

## 2021-04-29 MED ORDER — HYDROXYZINE PAMOATE 100 MG PO CAPS: 100.0000 mg | ORAL_CAPSULE | Freq: Every day | ORAL | 2 refills | Status: DC

## 2021-04-29 MED ORDER — DULOXETINE HCL 60 MG PO CPEP: 60.0000 mg | ORAL_CAPSULE | Freq: Two times a day (BID) | ORAL | 2 refills | Status: DC

## 2021-04-29 MED ORDER — ZOLPIDEM TARTRATE 10 MG PO TABS: 10.0000 mg | ORAL_TABLET | Freq: Every day | ORAL | 2 refills | Status: DC

## 2021-04-29 MED ORDER — ALPRAZOLAM 1 MG PO TABS: 1.0000 mg | ORAL_TABLET | Freq: Three times a day (TID) | ORAL | 1 refills | Status: DC | PRN

## 2021-04-29 NOTE — Telephone Encounter (Signed)
sent 

## 2021-05-12 ENCOUNTER — Telehealth (HOSPITAL_COMMUNITY): Payer: Medicare HMO | Admitting: Psychiatry

## 2021-06-09 ENCOUNTER — Telehealth (INDEPENDENT_AMBULATORY_CARE_PROVIDER_SITE_OTHER): Payer: Medicare HMO | Admitting: Psychiatry

## 2021-06-09 ENCOUNTER — Other Ambulatory Visit: Payer: Self-pay

## 2021-06-09 ENCOUNTER — Encounter (HOSPITAL_COMMUNITY): Payer: Self-pay | Admitting: Psychiatry

## 2021-06-09 DIAGNOSIS — F332 Major depressive disorder, recurrent severe without psychotic features: Secondary | ICD-10-CM

## 2021-06-09 MED ORDER — ZOLPIDEM TARTRATE 10 MG PO TABS: 10.0000 mg | ORAL_TABLET | Freq: Every day | ORAL | 2 refills | Status: DC

## 2021-06-09 MED ORDER — DULOXETINE HCL 60 MG PO CPEP: 60.0000 mg | ORAL_CAPSULE | Freq: Two times a day (BID) | ORAL | 2 refills | Status: DC

## 2021-06-09 MED ORDER — ALPRAZOLAM 1 MG PO TABS: 1.0000 mg | ORAL_TABLET | Freq: Three times a day (TID) | ORAL | 1 refills | Status: DC | PRN

## 2021-06-09 MED ORDER — HYDROXYZINE PAMOATE 100 MG PO CAPS: 100.0000 mg | ORAL_CAPSULE | Freq: Every day | ORAL | 2 refills | Status: DC

## 2021-06-09 MED ORDER — CYCLOBENZAPRINE HCL 10 MG PO TABS: 10.0000 mg | ORAL_TABLET | Freq: Every day | ORAL | 2 refills | Status: DC

## 2021-06-09 NOTE — Progress Notes (Signed)
Virtual Visit via Telephone Note  I connected with [name] on 06/09/21 at  2:00 PM EDT by telephone and verified that I am speaking with the correct person using two identifiers.  Location: Patient: home Provider: home office   I discussed the limitations, risks, security and privacy concerns of performing an evaluation and management service by telephone and the availability of in person appointments. I also discussed with the patient that there may be a patient responsible charge related to this service. The patient expressed understanding and agreed to proceed.   I discussed the assessment and treatment plan with the patient. The patient was provided an opportunity to ask questions and all were answered. The patient agreed with the plan and demonstrated an understanding of the instructions.   The patient was advised to call back or seek an in-person evaluation if the symptoms worsen or if the condition fails to improve as anticipated.  I provided 15 minutes of non-face-to-face time during this encounter.   [name], MD  [facility] MD/PA/NP OP Progress Note  06/09/2021 2:19 PM [name]  MRN:  [id number]  Chief Complaint:  Chief Complaint   Anxiety; Depression; Follow-up    HPI: this patient is a 55 year-old divorced white female who lives alone in [place]. She has no children and is unemployed. She is applying for disability.   The patient was returned referred by Sharyn Dross, her therapist, for further assessment and treatment of depression and anxiety.   The patient is a very poor historian made virtually no eye contact and answered questions in monosyllables. She did relate that she's been depressed for at least 17 years. In the intervening time she has gone through a divorce her sister died in her father died. She's been in therapy most of this time and is either seen psychiatrist or been treated by her primary doctor for depression. She's been on  numerous antidepressants but doesn't remember any of the names except Zoloft. She's currently on a combination of Cymbalta and Wellbutrin which she states isn't working. Xanax is given at night and she states it helps a little bit  The patient returns for follow-up after 3 months.  She states that she is doing about the same.  She is often feels depressed and lonely.  She states that she tried to help one of her brothers and let them some money and he did not care to pare back it does not seem to really care about her.  She feels angry and disappointed.  She occasionally reaches out to friends but not very often.  In terms of her health she is tired all the time and her primary doctor has diagnosed her with low iron.  She is supposed to be eating iron rich foods.  Her diabetic control is good.  She is sleeping fairly well with the medications.  She is not sure that the medications have a problem with her movement more than disappointment with family members.  She denies suicidal ideation   Visit Diagnosis:    ICD-10-CM   1. 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.2       Past Psychiatric History: Long-term outpatient treatment  Past Medical History:  Past Medical History:  Diagnosis Date   Anxiety    Arthritis    Asthma    Chronic kidney disease    IGA   Complication of anesthesia    Depression    Diabetes mellitus without complication (HCC)  type II    GERD (gastroesophageal reflux disease)    Heart murmur    since birth- no need to be concern   History of hiatal hernia    History of kidney stones    hx of    Hypertension    PONV (postoperative nausea and vomiting)    Shortness of breath    With exertion   Sleep apnea    severe sleep apnea uses oxygen 2l at nite     Past Surgical History:  Procedure Laterality Date   ABDOMINAL HYSTERECTOMY     BREAST SURGERY Right    biospy x2   CATARACT EXTRACTION     right eye   devaited  spetum surgery      DILATION AND CURETTAGE OF UTERUS     ESOPHAGOGASTRODUODENOSCOPY (EGD) WITH PROPOFOL N/A 12/26/2016   Procedure: ESOPHAGOGASTRODUODENOSCOPY (EGD) WITH PROPOFOL;  Surgeon: Luretha MurphyMatthew Martin, MD;  [facility];  Service: General;  Laterality: N/A;   ESOPHAGOGASTRODUODENOSCOPY (EGD) WITH PROPOFOL N/A 12/07/2017   Procedure: ESOPHAGOGASTRODUODENOSCOPY (EGD) WITH PROPOFOL;  Surgeon: Carman ChingEdwards, James, MD;  [facility];  Service: Endoscopy;  Laterality: N/A;   EYE SURGERY  04/23/2019   right,Pupil is dilated   LAPAROSCOPIC NISSEN FUNDOPLICATION N/A 01/16/2017   Procedure: LAPAROSCOPIC ENTEROLYSIS TAKEDOWN OF PRIOR NISSEN FUNDOPLICATION WITH UPPER ENDOSCOPY;  Surgeon: Luretha MurphyMatthew Martin, MD;  [facility]: [facility];  Service: General;  Laterality: N/A;   LUMBAR LAMINECTOMY/DECOMPRESSION MICRODISCECTOMY  10/23/2013   L4 L5   DR Shon BatonBROOKS   LUMBAR LAMINECTOMY/DECOMPRESSION MICRODISCECTOMY N/A 10/23/2013   Procedure: DECOMPRESSION AND FACET REMOVAL L4 - L5 1 LEVEL;  Surgeon: Venita Lickahari Brooks, MD;  [facility];  Service: Orthopedics;  Laterality: N/A;   nasal repair collapse      both sides of nose and 2 plates added    NASAL SINUS SURGERY     NISSEN FUNDOPLICATION     right foot surgery      SEPTOPLASTY  2013   SHOULDER ARTHROSCOPY WITH ROTATOR CUFF REPAIR Right    x 2   SHOULDER ARTHROSCOPY WITH ROTATOR CUFF REPAIR Left 05/07/2020   Procedure: Left shoulder arthroscopy, subacromial decompression, distal clavicle resection, rotator cuff repair;  Surgeon: Francena HanlySupple, Kevin, MD;  [facility]: [facility];  Service: Orthopedics;  Laterality: Left;  120min   WRIST SURGERY Left    work injury    Family Psychiatric History: see below  Family History:  Family History  Problem Relation Age of Onset   Alcohol abuse Brother    Drug abuse Other    Stroke Mother    Hypertension Mother    Diabetes Mother    Kidney disease Mother    Lung cancer Father    Heart attack Sister    Lung cancer  Brother     Social History:  Social History   Socioeconomic History   Marital status: Single    Spouse name: Not on file   Number of children: Not on file   Years of education: Not on file   Highest education level: Not on file  Occupational History   Not on file  Tobacco Use   Smoking status: Never   Smokeless tobacco: Never  Vaping Use   Vaping Use: Never used  Substance and Sexual Activity   Alcohol use: No   Drug use: No   Sexual activity: Never  Other Topics Concern   Not on file  Social History Narrative   Not on file   Social Determinants of Health   Financial Resource Strain:  Not on file  Food Insecurity: Not on file  Transportation Needs: Not on file  Physical Activity: Not on file  Stress: Not on file  Social Connections: Not on file    Allergies:  Allergies  Allergen Reactions   Bacitracin Rash   Adhesive [Tape] Other (See Comments)    Makes skin pull off *Band-aids and Plastic tape" pls use paper tape   Codeine Hives and Itching   Macrobid [Nitrofurantoin Monohyd Macro] Hives and Itching   Neosporin [Neomycin-Bacitracin Zn-Polymyx] Rash    Metabolic Disorder Labs: Lab Results  Component Value Date   HGBA1C 6.1 (H) 05/04/2020   MPG 128 05/04/2020   MPG 137 11/25/2016   No results found for: PROLACTIN No results found for: CHOL, TRIG, HDL, CHOLHDL, VLDL, LDLCALC No results found for: TSH  Therapeutic Level Labs: No results found for: LITHIUM No results found for: VALPROATE No components found for:  CBMZ  Current Medications: Current Outpatient Medications  Medication Sig Dispense Refill   albuterol (PROVENTIL HFA;VENTOLIN HFA) 108 (90 Base) MCG/ACT inhaler Inhale 2 puffs into the lungs every 4 (four) hours as needed for wheezing or shortness of breath.     allopurinol (ZYLOPRIM) 100 MG tablet Take 100 mg by mouth daily with breakfast.      ALPRAZolam (XANAX) 1 MG tablet Take 1 tablet (1 mg total) by mouth 3 (three) times daily as needed  for anxiety. 270 tablet 1   Ascorbic Acid (VITAMIN C) 1000 MG tablet Take 1,000 mg by mouth daily.     brimonidine (ALPHAGAN) 0.2 % ophthalmic solution Place 1 drop into the right eye in the morning, at noon, and at bedtime.     Cholecalciferol (VITAMIN D-3) 5000 units TABS Take 5,000 Units by mouth daily.     cyclobenzaprine (FLEXERIL) 10 MG tablet Take 1 tablet (10 mg total) by mouth at bedtime. 30 tablet 2   dorzolamide-timolol (COSOPT) 22.3-6.8 MG/ML ophthalmic solution Place 1 drop into the right eye in the morning and at bedtime.     DULoxetine (CYMBALTA) 60 MG capsule Take 1 capsule (60 mg total) by mouth 2 (two) times daily. 360 capsule 2   folic acid (FOLVITE) 1 MG tablet Take 1 mg by mouth 2 (two) times daily.     furosemide (LASIX) 20 MG tablet Take 20 mg by mouth daily.     glipiZIDE (GLUCOTROL XL) 2.5 MG 24 hr tablet Take 2.5 mg by mouth daily with breakfast.     hydrOXYzine (VISTARIL) 100 MG capsule Take 1 capsule (100 mg total) by mouth at bedtime. 90 capsule 2   levocetirizine (XYZAL) 5 MG tablet Take 5 mg by mouth at bedtime.     linaclotide (LINZESS) 290 MCG CAPS capsule Take 290 mcg by mouth daily as needed (constipation).      metFORMIN (GLUCOPHAGE-XR) 500 MG 24 hr tablet Take 500 mg by mouth daily with supper.     metoprolol succinate (TOPROL-XL) 100 MG 24 hr tablet Take 100 mg by mouth daily. Take with or immediately following a meal.     ondansetron (ZOFRAN) 4 MG tablet Take 4 mg by mouth every 8 (eight) hours as needed for nausea or vomiting.     OXYGEN Inhale 2 L into the lungs at bedtime.     pantoprazole (PROTONIX) 40 MG tablet Take 40 mg by mouth 2 (two) times daily.      polyethylene glycol (MIRALAX / GLYCOLAX) 17 g packet Take 17 g by mouth 4 (four) times a week.  potassium chloride (K-DUR) 10 MEQ tablet Take 10 mEq by mouth 3 (three) times daily.      prednisoLONE acetate (PRED FORTE) 1 % ophthalmic suspension Place 1 drop into the right eye in the morning and  at bedtime.      spironolactone-hydrochlorothiazide (ALDACTAZIDE) 25-25 MG tablet Take 1 tablet by mouth daily.     zinc gluconate 50 MG tablet Take 50 mg by mouth daily.     zolpidem (AMBIEN) 10 MG tablet Take 1 tablet (10 mg total) by mouth at bedtime. 90 tablet 2   No current facility-administered medications for this visit.     Musculoskeletal: Strength & Muscle Tone: within normal limits Gait & Station: normal Patient leans: N/A  Psychiatric Specialty Exam: Review of Systems  Constitutional:  Positive for fatigue.  Eyes:  Positive for visual disturbance.  Psychiatric/Behavioral:  Positive for decreased concentration and dysphoric mood.   All other systems reviewed and are negative.  There were no vitals taken for this visit.There is no height or weight on file to calculate BMI.  General Appearance: NA  Eye Contact:  NA  Speech:  Clear and Coherent  Volume:  Normal  Mood:  Dysphoric  Affect:  NA  Thought Process:  Goal Directed  Orientation:  Full (Time, Place, and Person)  Thought Content: Rumination   Suicidal Thoughts:  No  Homicidal Thoughts:  No  Memory:  Immediate;   Good Recent;   Fair Remote;   Fair  Judgement:  Good  Insight:  Fair  Psychomotor Activity:  Decreased  Concentration:  Concentration: Fair and Attention Span: Fair  Recall:  Good  Fund of Knowledge: Good  Language: Good  Akathisia:  No  Handed:  Right  AIMS (if indicated): not done  Assets:  Communication Skills Desire for Improvement Resilience Social Support  ADL's:  Intact  Cognition: WNL  Sleep:  Fair   Screenings: PHQ2-9    Flowsheet Row [facility] Video Visit from 03/15/2021 in [facility] Video Visit from 01/12/2021 in [facility]  PHQ-2 Total Score 5 5 5   PHQ-9 Total Score 18 13 19       Flowsheet Row Video Visit from  06/09/2021 in [facility] Video Visit from 03/15/2021 in [facility] Video Visit from 01/12/2021 in [facility]  C-SSRS RISK CATEGORY No Risk No Risk No Risk        Assessment and Plan: This patient is a 55 year old female with a history of chronic dysthymia depression insomnia.  This has been going on for years and it seems difficult to make much headway as she is very entrenched in her current habits.  She is being worked up by endocrine and primary care to determine the nature of the fatigue and what can be done about it.  For now we will continue Cymbalta 100 mg daily for depression, hydroxyzine 100 mg as well as Ambien 10 mg at bedtime for sleep, Xanax 1 mg up to 3 times daily as needed for anxiety, and cyclobenzaprine 10 mg at bedtime only as needed for spasms.  She will return to see me in 3 months   03/14/2021, MD 06/09/2021, 2:19 PM

## 2021-09-14 ENCOUNTER — Other Ambulatory Visit (HOSPITAL_COMMUNITY): Payer: Self-pay | Admitting: Psychiatry

## 2021-09-14 ENCOUNTER — Telehealth (HOSPITAL_COMMUNITY): Payer: Medicare HMO | Admitting: Psychiatry

## 2021-09-14 MED ORDER — DULOXETINE HCL 60 MG PO CPEP: 60.0000 mg | ORAL_CAPSULE | Freq: Two times a day (BID) | ORAL | 2 refills | Status: DC

## 2021-09-22 ENCOUNTER — Encounter (HOSPITAL_COMMUNITY): Payer: Self-pay | Admitting: Psychiatry

## 2021-09-22 ENCOUNTER — Telehealth (INDEPENDENT_AMBULATORY_CARE_PROVIDER_SITE_OTHER): Payer: Medicare HMO | Admitting: Psychiatry

## 2021-09-22 ENCOUNTER — Other Ambulatory Visit: Payer: Self-pay

## 2021-09-22 DIAGNOSIS — F332 Major depressive disorder, recurrent severe without psychotic features: Secondary | ICD-10-CM

## 2021-09-22 MED ORDER — ZOLPIDEM TARTRATE 10 MG PO TABS: 10.0000 mg | ORAL_TABLET | Freq: Every day | ORAL | 2 refills | Status: DC

## 2021-09-22 MED ORDER — CYCLOBENZAPRINE HCL 10 MG PO TABS: 10.0000 mg | ORAL_TABLET | Freq: Every day | ORAL | 2 refills | Status: DC

## 2021-09-22 MED ORDER — HYDROXYZINE PAMOATE 100 MG PO CAPS: 100.0000 mg | ORAL_CAPSULE | Freq: Every day | ORAL | 2 refills | Status: DC

## 2021-09-22 MED ORDER — DULOXETINE HCL 60 MG PO CPEP: 60.0000 mg | ORAL_CAPSULE | Freq: Two times a day (BID) | ORAL | 2 refills | Status: DC

## 2021-09-22 MED ORDER — ALPRAZOLAM 1 MG PO TABS: 1.0000 mg | ORAL_TABLET | Freq: Three times a day (TID) | ORAL | 1 refills | Status: DC | PRN

## 2021-09-22 MED ORDER — VORTIOXETINE HBR 10 MG PO TABS: 10.0000 mg | ORAL_TABLET | Freq: Every day | ORAL | 30 refills | Status: DC

## 2021-09-22 NOTE — Progress Notes (Signed)
Virtual Visit via Telephone Note  I connected with [name] on 09/22/21 at  2:00 PM EST by telephone and verified that I am speaking with the correct person using two identifiers.  Location: Patient: home Provider: office   I discussed the limitations, risks, security and privacy concerns of performing an evaluation and management service by telephone and the availability of in person appointments. I also discussed with the patient that there may be a patient responsible charge related to this service. The patient expressed understanding and agreed to proceed.      I discussed the assessment and treatment plan with the patient. The patient was provided an opportunity to ask questions and all were answered. The patient agreed with the plan and demonstrated an understanding of the instructions.   The patient was advised to call back or seek an in-person evaluation if the symptoms worsen or if the condition fails to improve as anticipated.  I provided 15 minutes of non-face-to-face time during this encounter.   Levonne Spiller, MD  [facility] MD/PA/NP OP Progress Note  09/22/2021 2:15 PM [name]  MRN:  [id number]  Chief Complaint:  Chief Complaint   Depression; Anxiety; Follow-up    HPI: this patient is a 55 year-old divorced white female who lives alone in [place]. She has no children and is unemployed. She is applying for disability  Patient returns after 3 months regarding her depression and anxiety.  The patient states she feels a little worse.  She is not entirely sure why.  She fell outside her house a few weeks ago and has had constant neck pain.  She is waiting to get into the chiropractor.  This is keeping her up at night despite taking hydroxyzine and Ambien.  Overall her mood is low and she feels lonely and forgotten about.  I have urged her to reach out to friends and she tries at times.  She denies suicidal ideation but does seem more sad. Visit Diagnosis:     ICD-10-CM   1. Major depressive disorder, recurrent, severe without psychotic features ([place])  F33.2       Past Psychiatric History: Long-term outpatient treatment  Past Medical History:  Past Medical History:  Diagnosis Date   Anxiety    Arthritis    Asthma    Chronic kidney disease    IGA   Complication of anesthesia    Depression    Diabetes mellitus without complication (HCC)    type II    GERD (gastroesophageal reflux disease)    Heart murmur    since birth- no need to be concern   History of hiatal hernia    History of kidney stones    hx of    Hypertension    PONV (postoperative nausea and vomiting)    Shortness of breath    With exertion   Sleep apnea    severe sleep apnea uses oxygen 2l at nite     Past Surgical History:  Procedure Laterality Date   ABDOMINAL HYSTERECTOMY     BREAST SURGERY Right    biospy x2   CATARACT EXTRACTION     right eye   devaited spetum surgery      DILATION AND CURETTAGE OF UTERUS     ESOPHAGOGASTRODUODENOSCOPY (EGD) WITH PROPOFOL N/A 12/26/2016   Procedure: ESOPHAGOGASTRODUODENOSCOPY (EGD) WITH PROPOFOL;  Surgeon: Johnathan Hausen, MD;  [facility]: [facility];  Service: General;  Laterality: N/A;   ESOPHAGOGASTRODUODENOSCOPY (EGD) WITH PROPOFOL N/A 12/07/2017   Procedure: ESOPHAGOGASTRODUODENOSCOPY (  EGD) WITH PROPOFOL;  Surgeon: Laurence Spates, MD;  Location: [place];  Service: Endoscopy;  Laterality: N/A;   EYE SURGERY  04/23/2019   right,Pupil is dilated   LAPAROSCOPIC NISSEN FUNDOPLICATION N/A [id number]   Procedure: LAPAROSCOPIC ENTEROLYSIS TAKEDOWN OF PRIOR NISSEN FUNDOPLICATION WITH UPPER ENDOSCOPY;  Surgeon: Johnathan Hausen, MD;  [facility];  Service: General;  Laterality: N/A;   LUMBAR LAMINECTOMY/DECOMPRESSION MICRODISCECTOMY  10/23/2013   L4 L5   DR Rolena Infante   LUMBAR LAMINECTOMY/DECOMPRESSION MICRODISCECTOMY N/A 10/23/2013   Procedure: DECOMPRESSION AND FACET REMOVAL L4 - L5 1 LEVEL;  Surgeon: Melina Schools, MD;   Location: [place];  Service: Orthopedics;  Laterality: N/A;   nasal repair collapse      both sides of nose and 2 plates added    NASAL SINUS SURGERY     NISSEN FUNDOPLICATION     right foot surgery      SEPTOPLASTY  2013   SHOULDER ARTHROSCOPY WITH ROTATOR CUFF REPAIR Right    x 2   SHOULDER ARTHROSCOPY WITH ROTATOR CUFF REPAIR Left 05/07/2020   Procedure: Left shoulder arthroscopy, subacromial decompression, distal clavicle resection, rotator cuff repair;  Surgeon: Justice Britain, MD;  Location: [facility];  Service: Orthopedics;  Laterality: Left;  157min   WRIST SURGERY Left    work injury    Family Psychiatric History: see below  Family History:  Family History  Problem Relation Age of Onset   Alcohol abuse Brother    Drug abuse Other    Stroke Mother    Hypertension Mother    Diabetes Mother    Kidney disease Mother    Lung cancer Father    Heart attack Sister    Lung cancer Brother     Social History:  Social History   Socioeconomic History   Marital status: Single    Spouse name: Not on file   Number of children: Not on file   Years of education: Not on file   Highest education level: Not on file  Occupational History   Not on file  Tobacco Use   Smoking status: Never   Smokeless tobacco: Never  Vaping Use   Vaping Use: Never used  Substance and Sexual Activity   Alcohol use: No   Drug use: No   Sexual activity: Never  Other Topics Concern   Not on file  Social History Narrative   Not on file   Social Determinants of Health   Financial Resource Strain: Not on file  Food Insecurity: Not on file  Transportation Needs: Not on file  Physical Activity: Not on file  Stress: Not on file  Social Connections: Not on file    Allergies:  Allergies  Allergen Reactions   Bacitracin Rash   Adhesive [Tape] Other (See Comments)    Makes skin pull off *Band-aids and Plastic tape" pls use paper tape   Codeine Hives and Itching   Macrobid [Nitrofurantoin Monohyd  Macro] Hives and Itching   Neosporin [Neomycin-Bacitracin Zn-Polymyx] Rash    Metabolic Disorder Labs: Lab Results  Component Value Date   HGBA1C 6.1 (H) 05/04/2020   MPG 128 05/04/2020   MPG 137 11/25/2016   No results found for: PROLACTIN No results found for: CHOL, TRIG, HDL, CHOLHDL, VLDL, LDLCALC No results found for: TSH  Therapeutic Level Labs: No results found for: LITHIUM No results found for: VALPROATE No components found for:  CBMZ  Current Medications: Current Outpatient Medications  Medication Sig Dispense Refill   vortioxetine HBr (TRINTELLIX) 10 MG  TABS tablet Take 1 tablet (10 mg total) by mouth daily. 30 tablet 30   albuterol (PROVENTIL HFA;VENTOLIN HFA) 108 (90 Base) MCG/ACT inhaler Inhale 2 puffs into the lungs every 4 (four) hours as needed for wheezing or shortness of breath.     allopurinol (ZYLOPRIM) 100 MG tablet Take 100 mg by mouth daily with breakfast.      ALPRAZolam (XANAX) 1 MG tablet Take 1 tablet (1 mg total) by mouth 3 (three) times daily as needed for anxiety. 270 tablet 1   Ascorbic Acid (VITAMIN C) 1000 MG tablet Take 1,000 mg by mouth daily.     brimonidine (ALPHAGAN) 0.2 % ophthalmic solution Place 1 drop into the right eye in the morning, at noon, and at bedtime.     Cholecalciferol (VITAMIN D-3) 5000 units TABS Take 5,000 Units by mouth daily.     cyclobenzaprine (FLEXERIL) 10 MG tablet Take 1 tablet (10 mg total) by mouth at bedtime. 30 tablet 2   dorzolamide-timolol (COSOPT) 22.3-6.8 MG/ML ophthalmic solution Place 1 drop into the right eye in the morning and at bedtime.     DULoxetine (CYMBALTA) 60 MG capsule Take 1 capsule (60 mg total) by mouth 2 (two) times daily. 360 capsule 2   folic acid (FOLVITE) 1 MG tablet Take 1 mg by mouth 2 (two) times daily.     furosemide (LASIX) 20 MG tablet Take 20 mg by mouth daily.     glipiZIDE (GLUCOTROL XL) 2.5 MG 24 hr tablet Take 2.5 mg by mouth daily with breakfast.     hydrOXYzine (VISTARIL) 100  MG capsule Take 1 capsule (100 mg total) by mouth at bedtime. 90 capsule 2   levocetirizine (XYZAL) 5 MG tablet Take 5 mg by mouth at bedtime.     linaclotide (LINZESS) 290 MCG CAPS capsule Take 290 mcg by mouth daily as needed (constipation).      metFORMIN (GLUCOPHAGE-XR) 500 MG 24 hr tablet Take 500 mg by mouth daily with supper.     metoprolol succinate (TOPROL-XL) 100 MG 24 hr tablet Take 100 mg by mouth daily. Take with or immediately following a meal.     ondansetron (ZOFRAN) 4 MG tablet Take 4 mg by mouth every 8 (eight) hours as needed for nausea or vomiting.     OXYGEN Inhale 2 L into the lungs at bedtime.     pantoprazole (PROTONIX) 40 MG tablet Take 40 mg by mouth 2 (two) times daily.      polyethylene glycol (MIRALAX / GLYCOLAX) 17 g packet Take 17 g by mouth 4 (four) times a week.      potassium chloride (K-DUR) 10 MEQ tablet Take 10 mEq by mouth 3 (three) times daily.      prednisoLONE acetate (PRED FORTE) 1 % ophthalmic suspension Place 1 drop into the right eye in the morning and at bedtime.      spironolactone-hydrochlorothiazide (ALDACTAZIDE) 25-25 MG tablet Take 1 tablet by mouth daily.     zinc gluconate 50 MG tablet Take 50 mg by mouth daily.     zolpidem (AMBIEN) 10 MG tablet Take 1 tablet (10 mg total) by mouth at bedtime. 90 tablet 2   No current facility-administered medications for this visit.     Musculoskeletal: Strength & Muscle Tone: na Gait & Station: na Patient leans: N/A  Psychiatric Specialty Exam: Review of Systems  Eyes:  Positive for visual disturbance.  Musculoskeletal:  Positive for arthralgias, myalgias and neck pain.  Psychiatric/Behavioral:  Positive for dysphoric mood and  sleep disturbance.   All other systems reviewed and are negative.  There were no vitals taken for this visit.There is no height or weight on file to calculate BMI.  General Appearance: NA  Eye Contact:  NA  Speech:  Clear and Coherent  Volume:  Normal  Mood:  Dysphoric   Affect:  NA  Thought Process:  Goal Directed  Orientation:  Full (Time, Place, and Person)  Thought Content: Rumination   Suicidal Thoughts:  No  Homicidal Thoughts:  No  Memory:  Immediate;   Good Recent;   Good Remote;   Fair  Judgement:  Good  Insight:  Fair  Psychomotor Activity:  Decreased  Concentration:  Concentration: Good and Attention Span: Good  Recall:  Good  Fund of Knowledge: Good  Language: Good  Akathisia:  No  Handed:  Right  AIMS (if indicated): not done  Assets:  Communication Skills Desire for Improvement Resilience Social Support Talents/Skills  ADL's:  Intact  Cognition: WNL  Sleep:  Poor   Screenings: PHQ2-9    Flowsheet Row Video Visit from 09/22/2021 in [place] Video Visit from 06/09/2021 in [facility] Video Visit from 03/15/2021 in [facility] Video Visit from 01/12/2021 in [facility]  PHQ-2 Total Score 5 5 5 5   PHQ-9 Total Score 12 18 13 19       Flowsheet Row Video Visit from 09/22/2021 in [place] Video Visit from 06/09/2021 in [facility] Video Visit from 03/15/2021 in [place] No Risk No Risk No Risk        Assessment and Plan: This patient is a 55 year old female with a history of chronic dysthymia depression and insomnia.  Since she is more depressed we will add Trintellix 10 mg daily to the Cymbalta 100 mg twice daily for depression, continue hydroxyzine 100 mg as well as Ambien 10 mg at bedtime for sleep and Xanax 1 mg up to 3 times daily for anxiety and cyclobenzaprine 10 mg at bedtime only as needed for spasm.  She will return to see me in 6 weeks   Levonne Spiller, MD 09/22/2021, 2:15 PM

## 2021-09-23 ENCOUNTER — Other Ambulatory Visit (HOSPITAL_COMMUNITY): Payer: Self-pay | Admitting: Psychiatry

## 2021-10-18 ENCOUNTER — Telehealth (HOSPITAL_COMMUNITY): Payer: Self-pay | Admitting: *Deleted

## 2021-10-18 NOTE — Telephone Encounter (Signed)
Next month is good

## 2021-10-18 NOTE — Telephone Encounter (Signed)
Patient called stating she would like to let provider know that she did not pick up or get the vortioxetine HBr (TRINTELLIX) 10 MG TABS tablet. Per pt due to her not reaching her deductible, this medication is going to cost a lost and she can not afford it. Per pt nothing has changed since she talked to her in November. Per pt she is not feeling worse.   Per pt she would like to know if its okay to push out her appt and what month should she return.

## 2021-10-19 NOTE — Telephone Encounter (Signed)
Patient aware.

## 2021-11-02 ENCOUNTER — Other Ambulatory Visit (HOSPITAL_COMMUNITY): Payer: Self-pay | Admitting: Psychiatry

## 2021-11-02 MED ORDER — HYDROXYZINE PAMOATE 100 MG PO CAPS
100.0000 mg | ORAL_CAPSULE | Freq: Every day | ORAL | 2 refills | Status: DC
Start: 2021-11-02 — End: 2021-11-22

## 2021-11-02 MED ORDER — ALPRAZOLAM 1 MG PO TABS: 1.0000 mg | ORAL_TABLET | Freq: Three times a day (TID) | ORAL | 1 refills | Status: DC | PRN

## 2021-11-02 MED ORDER — ZOLPIDEM TARTRATE 10 MG PO TABS: 10.0000 mg | ORAL_TABLET | Freq: Every day | ORAL | 2 refills | Status: DC

## 2021-11-16 ENCOUNTER — Telehealth (HOSPITAL_COMMUNITY): Payer: Medicare HMO | Admitting: Psychiatry

## 2021-11-22 ENCOUNTER — Other Ambulatory Visit: Payer: Self-pay

## 2021-11-22 ENCOUNTER — Encounter (HOSPITAL_COMMUNITY): Payer: Self-pay | Admitting: Psychiatry

## 2021-11-22 ENCOUNTER — Telehealth (INDEPENDENT_AMBULATORY_CARE_PROVIDER_SITE_OTHER): Payer: Medicare HMO | Admitting: Psychiatry

## 2021-11-22 DIAGNOSIS — F332 Major depressive disorder, recurrent severe without psychotic features: Secondary | ICD-10-CM

## 2021-11-22 MED ORDER — ZOLPIDEM TARTRATE 10 MG PO TABS: 10.0000 mg | ORAL_TABLET | Freq: Every day | ORAL | 2 refills | Status: DC

## 2021-11-22 MED ORDER — HYDROXYZINE PAMOATE 100 MG PO CAPS: 100.0000 mg | ORAL_CAPSULE | Freq: Every day | ORAL | 2 refills | Status: DC

## 2021-11-22 MED ORDER — VORTIOXETINE HBR 10 MG PO TABS: 10.0000 mg | ORAL_TABLET | Freq: Every day | ORAL | 30 refills | Status: DC

## 2021-11-22 MED ORDER — ALPRAZOLAM 1 MG PO TABS: 1.0000 mg | ORAL_TABLET | Freq: Three times a day (TID) | ORAL | 1 refills | Status: DC | PRN

## 2021-11-22 MED ORDER — CYCLOBENZAPRINE HCL 10 MG PO TABS: 10.0000 mg | ORAL_TABLET | Freq: Every day | ORAL | 2 refills | Status: DC

## 2021-11-22 MED ORDER — DULOXETINE HCL 60 MG PO CPEP: 60.0000 mg | ORAL_CAPSULE | Freq: Two times a day (BID) | ORAL | 2 refills | Status: DC

## 2021-11-22 NOTE — Progress Notes (Signed)
Virtual Visit via Telephone Note  I connected with [name] on 11/22/21 at  2:00 PM EST by telephone and verified that I am speaking with the correct person using two identifiers.  Location: Patient: home Provider: home office   I discussed the limitations, risks, security and privacy concerns of performing an evaluation and management service by telephone and the availability of in person appointments. I also discussed with the patient that there may be a patient responsible charge related to this service. The patient expressed understanding and agreed to proceed.      I discussed the assessment and treatment plan with the patient. The patient was provided an opportunity to ask questions and all were answered. The patient agreed with the plan and demonstrated an understanding of the instructions.   The patient was advised to call back or seek an in-person evaluation if the symptoms worsen or if the condition fails to improve as anticipated.  I provided 18 minutes of non-face-to-face time during this encounter.   [name], MD  [facility] MD/PA/NP OP Progress Note  11/22/2021 2:14 PM [name]  MRN:  [id number]  Chief Complaint: depression, anxiety HPI:  this patient is a 56 year-old divorced white female who lives alone in [place]. She has no children and is unemployed. She is applying for disability  The patient returns for follow-up after 2 months.  Last time we had tried adding Trintellix to her regimen but she called back stating it was too expensive.  Currently she is dealing with a lot of fatigue.  She states that her iron binding capacity is low.  I urged her to call her primary doctor as she has been taking iron with no improvement for 3 months.  In the past she has had iron infusions which have helped.  She still is somewhat depressed but feels better now that the holidays are over.  Her anxiety is fairly well controlled and she is sleeping okay most of the time.   As usual she is very negative.  She has a lot of medical issues such as chronic neck pain and gastroparesis and nausea.  She is inadvertently lost about 15 pounds.  She denies thoughts of self-harm or suicidal ideation. Visit Diagnosis:    ICD-10-CM   1. 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.2       Past Psychiatric History: Long-term outpatient treatment  Past Medical History:  Past Medical History:  Diagnosis Date   Anxiety    Arthritis    Asthma    Chronic kidney disease    IGA   Complication of anesthesia    Depression    Diabetes mellitus without complication (HCC)    type II    GERD (gastroesophageal reflux disease)    Heart murmur    since birth- no need to be concern   History of hiatal hernia    History of kidney stones    hx of    Hypertension    PONV (postoperative nausea and vomiting)    Shortness of breath    With exertion   Sleep apnea    severe sleep apnea uses oxygen 2l at nite     Past Surgical History:  Procedure Laterality Date   ABDOMINAL HYSTERECTOMY     BREAST SURGERY Right    biospy x2   CATARACT EXTRACTION     right eye   devaited spetum surgery      DILATION AND CURETTAGE OF UTERUS  ESOPHAGOGASTRODUODENOSCOPY (EGD) WITH PROPOFOL N/A 12/26/2016   Procedure: ESOPHAGOGASTRODUODENOSCOPY (EGD) WITH PROPOFOL;  Surgeon: Luretha MurphyMatthew Martin, MD;  [facility];  Service: General;  Laterality: N/A;   ESOPHAGOGASTRODUODENOSCOPY (EGD) WITH PROPOFOL N/A 12/07/2017   Procedure: ESOPHAGOGASTRODUODENOSCOPY (EGD) WITH PROPOFOL;  Surgeon: Carman ChingEdwards, James, MD;  [facility];  Service: Endoscopy;  Laterality: N/A;   EYE SURGERY  04/23/2019   right,Pupil is dilated   LAPAROSCOPIC NISSEN FUNDOPLICATION N/A 01/16/2017   Procedure: LAPAROSCOPIC ENTEROLYSIS TAKEDOWN OF PRIOR NISSEN FUNDOPLICATION WITH UPPER ENDOSCOPY;  Surgeon: Luretha MurphyMatthew Martin, MD;  Location: [facility];  Service: General;  Laterality: N/A;    LUMBAR LAMINECTOMY/DECOMPRESSION MICRODISCECTOMY  10/23/2013   L4 L5   DR Shon BatonBROOKS   LUMBAR LAMINECTOMY/DECOMPRESSION MICRODISCECTOMY N/A 10/23/2013   Procedure: DECOMPRESSION AND FACET REMOVAL L4 - L5 1 LEVEL;  Surgeon: Venita Lickahari Brooks, MD;  Location: [facility];  Service: Orthopedics;  Laterality: N/A;   nasal repair collapse      both sides of nose and 2 plates added    NASAL SINUS SURGERY     NISSEN FUNDOPLICATION     right foot surgery      SEPTOPLASTY  2013   SHOULDER ARTHROSCOPY WITH ROTATOR CUFF REPAIR Right    x 2   SHOULDER ARTHROSCOPY WITH ROTATOR CUFF REPAIR Left 05/07/2020   Procedure: Left shoulder arthroscopy, subacromial decompression, distal clavicle resection, rotator cuff repair;  Surgeon: Francena HanlySupple, Kevin, MD;  Location: [facility];  Service: Orthopedics;  Laterality: Left;  120min   WRIST SURGERY Left    work injury    Family Psychiatric History: see below  Family History:  Family History  Problem Relation Age of Onset   Alcohol abuse Brother    Drug abuse Other    Stroke Mother    Hypertension Mother    Diabetes Mother    Kidney disease Mother    Lung cancer Father    Heart attack Sister    Lung cancer Brother     Social History:  Social History   Socioeconomic History   Marital status: Single    Spouse name: Not on file   Number of children: Not on file   Years of education: Not on file   Highest education level: Not on file  Occupational History   Not on file  Tobacco Use   Smoking status: Never   Smokeless tobacco: Never  Vaping Use   Vaping Use: Never used  Substance and Sexual Activity   Alcohol use: No   Drug use: No   Sexual activity: Never  Other Topics Concern   Not on file  Social History Narrative   Not on file   Social Determinants of Health   Financial Resource Strain: Not on file  Food Insecurity: Not on file  Transportation Needs: Not on file  Physical Activity: Not on file  Stress: Not on file  Social Connections: Not on file     Allergies:  Allergies  Allergen Reactions   Bacitracin Rash   Adhesive [Tape] Other (See Comments)    Makes skin pull off *Band-aids and Plastic tape" pls use paper tape   Codeine Hives and Itching   Macrobid [Nitrofurantoin Monohyd Macro] Hives and Itching   Neosporin [Neomycin-Bacitracin Zn-Polymyx] Rash    Metabolic Disorder Labs: Lab Results  Component Value Date   HGBA1C 6.1 (H) 05/04/2020   MPG 128 05/04/2020   MPG 137 11/25/2016   No results found for: PROLACTIN No results found for: CHOL, TRIG, HDL, CHOLHDL, VLDL, LDLCALC No results found  for: TSH  Therapeutic Level Labs: No results found for: LITHIUM No results found for: VALPROATE No components found for:  CBMZ  Current Medications: Current Outpatient Medications  Medication Sig Dispense Refill   albuterol (PROVENTIL HFA;VENTOLIN HFA) 108 (90 Base) MCG/ACT inhaler Inhale 2 puffs into the lungs every 4 (four) hours as needed for wheezing or shortness of breath.     allopurinol (ZYLOPRIM) 100 MG tablet Take 100 mg by mouth daily with breakfast.      ALPRAZolam (XANAX) 1 MG tablet Take 1 tablet (1 mg total) by mouth 3 (three) times daily as needed for anxiety. 270 tablet 1   Ascorbic Acid (VITAMIN C) 1000 MG tablet Take 1,000 mg by mouth daily.     brimonidine (ALPHAGAN) 0.2 % ophthalmic solution Place 1 drop into the right eye in the morning, at noon, and at bedtime.     Cholecalciferol (VITAMIN D-3) 5000 units TABS Take 5,000 Units by mouth daily.     cyclobenzaprine (FLEXERIL) 10 MG tablet TAKE 1 TABLET (10 MG TOTAL) BY MOUTH AT BEDTIME. 90 tablet 2   dorzolamide-timolol (COSOPT) 22.3-6.8 MG/ML ophthalmic solution Place 1 drop into the right eye in the morning and at bedtime.     DULoxetine (CYMBALTA) 60 MG capsule Take 1 capsule (60 mg total) by mouth 2 (two) times daily. 360 capsule 2   folic acid (FOLVITE) 1 MG tablet Take 1 mg by mouth 2 (two) times daily.     furosemide (LASIX) 20 MG tablet Take 20 mg by  mouth daily.     glipiZIDE (GLUCOTROL XL) 2.5 MG 24 hr tablet Take 2.5 mg by mouth daily with breakfast.     hydrOXYzine (VISTARIL) 100 MG capsule Take 1 capsule (100 mg total) by mouth at bedtime. 90 capsule 2   levocetirizine (XYZAL) 5 MG tablet Take 5 mg by mouth at bedtime.     linaclotide (LINZESS) 290 MCG CAPS capsule Take 290 mcg by mouth daily as needed (constipation).      metFORMIN (GLUCOPHAGE-XR) 500 MG 24 hr tablet Take 500 mg by mouth daily with supper.     metoprolol succinate (TOPROL-XL) 100 MG 24 hr tablet Take 100 mg by mouth daily. Take with or immediately following a meal.     ondansetron (ZOFRAN) 4 MG tablet Take 4 mg by mouth every 8 (eight) hours as needed for nausea or vomiting.     OXYGEN Inhale 2 L into the lungs at bedtime.     pantoprazole (PROTONIX) 40 MG tablet Take 40 mg by mouth 2 (two) times daily.      polyethylene glycol (MIRALAX / GLYCOLAX) 17 g packet Take 17 g by mouth 4 (four) times a week.      potassium chloride (K-DUR) 10 MEQ tablet Take 10 mEq by mouth 3 (three) times daily.      prednisoLONE acetate (PRED FORTE) 1 % ophthalmic suspension Place 1 drop into the right eye in the morning and at bedtime.      spironolactone-hydrochlorothiazide (ALDACTAZIDE) 25-25 MG tablet Take 1 tablet by mouth daily.     vortioxetine HBr (TRINTELLIX) 10 MG TABS tablet Take 1 tablet (10 mg total) by mouth daily. 30 tablet 30   zinc gluconate 50 MG tablet Take 50 mg by mouth daily.     zolpidem (AMBIEN) 10 MG tablet Take 1 tablet (10 mg total) by mouth at bedtime. 90 tablet 2   No current facility-administered medications for this visit.     Musculoskeletal: Strength & Muscle Tone:  na Gait & Station: na Patient leans: N/A  Psychiatric Specialty Exam: Review of Systems  Constitutional:  Positive for appetite change and fatigue.  Gastrointestinal:  Positive for nausea.  Musculoskeletal:  Positive for neck pain.  Psychiatric/Behavioral:  Positive for dysphoric mood.    All other systems reviewed and are negative.  There were no vitals taken for this visit.There is no height or weight on file to calculate BMI.  General Appearance: NA  Eye Contact:  NA  Speech:  Clear and Coherent  Volume:  Normal  Mood:  Dysphoric  Affect:  NA  Thought Process:  Goal Directed  Orientation:  Full (Time, Place, and Person)  Thought Content: Rumination   Suicidal Thoughts:  No  Homicidal Thoughts:  No  Memory:  Immediate;   Good Recent;   Good Remote;   Good  Judgement:  Good  Insight:  Fair  Psychomotor Activity:  Decreased  Concentration:  Concentration: Fair and Attention Span: Fair  Recall:  Good  Fund of Knowledge: Good  Language: Good  Akathisia:  No  Handed:  Right  AIMS (if indicated): not done  Assets:  Communication Skills Desire for Improvement Resilience Social Support  ADL's:  Intact  Cognition: WNL  Sleep:  Fair   Screenings: PHQ2-9    Flowsheet Row [facility] Video Visit from 09/22/2021 in [facility] Video Visit from 06/09/2021 in [facility] Video Visit from 03/15/2021 in [facility] Video Visit from 01/12/2021 in [facility]  PHQ-2 Total Score 2 5 5 5 5   PHQ-9 Total Score 8 12 18 13 19       Flowsheet Row Video Visit from 09/22/2021 in [facility] Video Visit from 06/09/2021 in [facility] Video Visit from 03/15/2021 in [facility]  C-SSRS RISK CATEGORY No Risk No Risk No Risk        Assessment and Plan: Patient is a 56 year old female with a history of chronic dysthymia depression and insomnia and anxiety.  We will again try to add the Trintellix and she will use a good Rx  card.  If she cannot get it she will let 05/15/2021 know and we will try to get samples of the 10 mg dosage.  She will continue Cymbalta 60 mg twice daily for depression, hydroxyzine 100 mg as well as Ambien 10 mg at bedtime for sleep and Xanax 1 mg up to 3 times daily for anxiety and cyclobenzaprine 10 mg at bedtime only as needed for spasm.  She will return to see me in 3 months at her request   56, MD 11/22/2021, 2:14 PM

## 2022-02-22 ENCOUNTER — Telehealth (HOSPITAL_COMMUNITY): Payer: Medicare HMO | Admitting: Psychiatry

## 2022-02-23 ENCOUNTER — Encounter (HOSPITAL_COMMUNITY): Payer: Self-pay | Admitting: Psychiatry

## 2022-02-23 ENCOUNTER — Telehealth (INDEPENDENT_AMBULATORY_CARE_PROVIDER_SITE_OTHER): Payer: Medicare HMO | Admitting: Psychiatry

## 2022-02-23 DIAGNOSIS — F332 Major depressive disorder, recurrent severe without psychotic features: Secondary | ICD-10-CM | POA: Diagnosis not present

## 2022-02-23 MED ORDER — ZOLPIDEM TARTRATE 10 MG PO TABS: 10.0000 mg | ORAL_TABLET | Freq: Every day | ORAL | 2 refills | Status: DC

## 2022-02-23 MED ORDER — DULOXETINE HCL 60 MG PO CPEP: 60.0000 mg | ORAL_CAPSULE | Freq: Two times a day (BID) | ORAL | 2 refills | Status: DC

## 2022-02-23 MED ORDER — HYDROXYZINE PAMOATE 100 MG PO CAPS: 100.0000 mg | ORAL_CAPSULE | Freq: Every day | ORAL | 2 refills | Status: DC

## 2022-02-23 MED ORDER — ALPRAZOLAM 1 MG PO TABS: 1.0000 mg | ORAL_TABLET | Freq: Three times a day (TID) | ORAL | 1 refills | Status: DC | PRN

## 2022-02-23 NOTE — Progress Notes (Signed)
Virtual Visit via Telephone Note ? ?I connected with [name] on 02/23/22 at  2:00 PM EDT by telephone and verified that I am speaking with the correct person using two identifiers. ? ?Location: ?Patient: home ?Provider: office ?  ?I discussed the limitations, risks, security and privacy concerns of performing an evaluation and management service by telephone and the availability of in person appointments. I also discussed with the patient that there may be a patient responsible charge related to this service. The patient expressed understanding and agreed to proceed. ? ? ? ? ?  ?I discussed the assessment and treatment plan with the patient. The patient was provided an opportunity to ask questions and all were answered. The patient agreed with the plan and demonstrated an understanding of the instructions. ?  ?The patient was advised to call back or seek an in-person evaluation if the symptoms worsen or if the condition fails to improve as anticipated. ? ?I provided 15 minutes of non-face-to-face time during this encounter. ? ? ?Levonne Spiller, MD ? ?[facility] MD/PA/NP OP Progress Note ? ?02/23/2022 2:32 PM ?[name]  ?MRN:  [id number] ? ?Chief Complaint:  ?Chief Complaint  ?Patient presents with  ? Anxiety  ? Depression  ? Follow-up  ? ?HPI: This patient is a 56 year old divorced white female who lives alone in [place].  She has no children and is unemployed.  She is on disability. ? ?The patient returns for follow-up after 4 months.  She states that she recently had arthroscopic surgery in her knee.  This was only about 10 days ago and she is still recovering.  Her brother stayed for the first few days but now she is trying to manage on her own.  She is still somewhat depressed but is determined to get more mobile.  She did have iron infusions which helped her energy a little bit.  As usual she is somewhat negative about things.  She is still having significant visual problems and is going to get another  opinion at [facility].  She denies thoughts of self-harm or suicide and feels her medications are helpful for the most part she denies any psychotic symptoms such as auditory or visual hallucinations. ?Visit Diagnosis:  ?  ICD-10-CM   ?1. Major depressive disorder, recurrent, severe without psychotic features ([place])  F33.2   ?  ? ? ?Past Psychiatric History: Long-term outpatient treatment ? ?Past Medical History:  ?Past Medical History:  ?Diagnosis Date  ? Anxiety   ? Arthritis   ? Asthma   ? Chronic kidney disease   ? IGA  ? Complication of anesthesia   ? Depression   ? Diabetes mellitus without complication ([place])   ? type II   ? GERD (gastroesophageal reflux disease)   ? Heart murmur   ? since birth- no need to be concern  ? History of hiatal hernia   ? History of kidney stones   ? hx of   ? Hypertension   ? PONV (postoperative nausea and vomiting)   ? Shortness of breath   ? With exertion  ? Sleep apnea   ? severe sleep apnea uses oxygen 2l at nite   ?  ?Past Surgical History:  ?Procedure Laterality Date  ? ABDOMINAL HYSTERECTOMY    ? BREAST SURGERY Right   ? biospy x2  ? CATARACT EXTRACTION    ? right eye  ? devaited spetum surgery     ? DILATION AND CURETTAGE OF UTERUS    ? ESOPHAGOGASTRODUODENOSCOPY (  EGD) WITH PROPOFOL N/A 12/26/2016  ? Procedure: ESOPHAGOGASTRODUODENOSCOPY (EGD) WITH PROPOFOL;  Surgeon: Luretha Murphy, MD;  [facility]: [facility];  Service: General;  Laterality: N/A;  ? ESOPHAGOGASTRODUODENOSCOPY (EGD) WITH PROPOFOL N/A 12/07/2017  ? Procedure: ESOPHAGOGASTRODUODENOSCOPY (EGD) WITH PROPOFOL;  Surgeon: Carman Ching, MD;  [facility];  Service: Endoscopy;  Laterality: N/A;  ? EYE SURGERY  04/23/2019  ? right,Pupil is dilated  ? LAPAROSCOPIC NISSEN FUNDOPLICATION N/A 01/16/2017  ? Procedure: LAPAROSCOPIC ENTEROLYSIS TAKEDOWN OF PRIOR NISSEN FUNDOPLICATION WITH UPPER ENDOSCOPY;  Surgeon: Luretha Murphy, MD;  Location: [facility];  Service: General;  Laterality: N/A;  ? LUMBAR  LAMINECTOMY/DECOMPRESSION MICRODISCECTOMY  10/23/2013  ? L4 L5   DR Shon Baton  ? LUMBAR LAMINECTOMY/DECOMPRESSION MICRODISCECTOMY N/A 10/23/2013  ? Procedure: DECOMPRESSION AND FACET REMOVAL L4 - L5 1 LEVEL;  Surgeon: Venita Lick, MD;  [facility];  Service: Orthopedics;  Laterality: N/A;  ? nasal repair collapse     ? both sides of nose and 2 plates added   ? NASAL SINUS SURGERY    ? NISSEN FUNDOPLICATION    ? right foot surgery     ? SEPTOPLASTY  2013  ? SHOULDER ARTHROSCOPY WITH ROTATOR CUFF REPAIR Right   ? x 2  ? SHOULDER ARTHROSCOPY WITH ROTATOR CUFF REPAIR Left 05/07/2020  ? Procedure: Left shoulder arthroscopy, subacromial decompression, distal clavicle resection, rotator cuff repair;  Surgeon: Francena Hanly, MD;  Location: [facility];  Service: Orthopedics;  Laterality: Left;   ? WRIST SURGERY Left   ? work injury  ? ? ?Family Psychiatric History: see below ? ?Family History:  ?Family History  ?Problem Relation Age of Onset  ? Alcohol abuse Brother   ? Drug abuse Other   ? Stroke Mother   ? Hypertension Mother   ? Diabetes Mother   ? Kidney disease Mother   ? Lung cancer Father   ? Heart attack Sister   ? Lung cancer Brother   ? ? ?Social History:  ?Social History  ? ?Socioeconomic History  ? Marital status: Single  ?  Spouse name: Not on file  ? Number of children: Not on file  ? Years of education: Not on file  ? Highest education level: Not on file  ?Occupational History  ? Not on file  ?Tobacco Use  ? Smoking status: Never  ? Smokeless tobacco: Never  ?Vaping Use  ? Vaping Use: Never used  ?Substance and Sexual Activity  ? Alcohol use: No  ? Drug use: No  ? Sexual activity: Never  ?Other Topics Concern  ? Not on file  ?Social History Narrative  ? Not on file  ? ?Social Determinants of Health  ? ?Financial Resource Strain: Not on file  ?Food Insecurity: Not on file  ?Transportation Needs: Not on file  ?Physical Activity: Not on file  ?Stress: Not on file  ?Social Connections: Not on file   ? ? ?Allergies:  ?Allergies  ?Allergen Reactions  ? Bacitracin Rash  ? Adhesive [Tape] Other (See Comments)  ?  Makes skin pull off ?*Band-aids and Plastic tape" pls use paper tape  ? Codeine Hives and Itching  ? Macrobid [Nitrofurantoin Monohyd Macro] Hives and Itching  ? Neosporin [Neomycin-Bacitracin Zn-Polymyx] Rash  ? ? ?Metabolic Disorder Labs: ?Lab Results  ?Component Value Date  ? HGBA1C 6.1 (H) 05/04/2020  ? MPG 128 05/04/2020  ? MPG 137 11/25/2016  ? ?No results found for: PROLACTIN ?No results found for: CHOL, TRIG, HDL, CHOLHDL, VLDL, LDLCALC ?No results found for:  TSH ? ?Therapeutic Level Labs: ?No results found for: LITHIUM ?No results found for: VALPROATE ?No components found for:  CBMZ ? ?Current Medications: ?Current Outpatient Medications  ?Medication Sig Dispense Refill  ? albuterol (PROVENTIL HFA;VENTOLIN HFA) 108 (90 Base) MCG/ACT inhaler Inhale 2 puffs into the lungs every 4 (four) hours as needed for wheezing or shortness of breath.    ? allopurinol (ZYLOPRIM) 100 MG tablet Take 100 mg by mouth daily with breakfast.     ? ALPRAZolam (XANAX) 1 MG tablet Take 1 tablet (1 mg total) by mouth 3 (three) times daily as needed for anxiety. 270 tablet 1  ? Ascorbic Acid (VITAMIN C) 1000 MG tablet Take 1,000 mg by mouth daily.    ? brimonidine (ALPHAGAN) 0.2 % ophthalmic solution Place 1 drop into the right eye in the morning, at noon, and at bedtime.    ? Cholecalciferol (VITAMIN D-3) 5000 units TABS Take 5,000 Units by mouth daily.    ? cyclobenzaprine (FLEXERIL) 10 MG tablet Take 1 tablet (10 mg total) by mouth at bedtime. 90 tablet 2  ? dorzolamide-timolol (COSOPT) 22.3-6.8 MG/ML ophthalmic solution Place 1 drop into the right eye in the morning and at bedtime.    ? DULoxetine (CYMBALTA) 60 MG capsule Take 1 capsule (60 mg total) by mouth 2 (two) times daily. 360 capsule 2  ? folic acid (FOLVITE) 1 MG tablet Take 1 mg by mouth 2 (two) times daily.    ? furosemide (LASIX) 20 MG tablet Take 20 mg by  mouth daily.    ? glipiZIDE (GLUCOTROL XL) 2.5 MG 24 hr tablet Take 2.5 mg by mouth daily with breakfast.    ? hydrOXYzine (VISTARIL) 100 MG capsule Take 1 capsule (100 mg total) by mouth at bedtime. 90 capsule 2  ? levocetirizine (

## 2022-06-13 ENCOUNTER — Other Ambulatory Visit (HOSPITAL_COMMUNITY): Payer: Self-pay | Admitting: Psychiatry

## 2022-06-13 MED ORDER — ZOLPIDEM TARTRATE 10 MG PO TABS: 10.0000 mg | ORAL_TABLET | Freq: Every day | ORAL | 2 refills | Status: DC

## 2022-06-23 ENCOUNTER — Telehealth (HOSPITAL_COMMUNITY): Payer: Medicare HMO | Admitting: Psychiatry

## 2022-06-27 ENCOUNTER — Telehealth (INDEPENDENT_AMBULATORY_CARE_PROVIDER_SITE_OTHER): Payer: Medicare HMO | Admitting: Psychiatry

## 2022-06-27 ENCOUNTER — Encounter (HOSPITAL_COMMUNITY): Payer: Self-pay | Admitting: Psychiatry

## 2022-06-27 DIAGNOSIS — F332 Major depressive disorder, recurrent severe without psychotic features: Secondary | ICD-10-CM | POA: Diagnosis not present

## 2022-06-27 MED ORDER — DULOXETINE HCL 60 MG PO CPEP: 60.0000 mg | ORAL_CAPSULE | Freq: Two times a day (BID) | ORAL | 2 refills | Status: DC

## 2022-06-27 MED ORDER — ZOLPIDEM TARTRATE 10 MG PO TABS: 10.0000 mg | ORAL_TABLET | Freq: Every day | ORAL | 2 refills | Status: DC

## 2022-06-27 MED ORDER — ALPRAZOLAM 1 MG PO TABS
1.0000 mg | ORAL_TABLET | Freq: Three times a day (TID) | ORAL | 1 refills | Status: DC | PRN
Start: 2022-06-27 — End: 2022-11-30

## 2022-06-27 MED ORDER — HYDROXYZINE PAMOATE 100 MG PO CAPS: 100.0000 mg | ORAL_CAPSULE | Freq: Every day | ORAL | 2 refills | Status: DC

## 2022-06-27 NOTE — Progress Notes (Signed)
Virtual Visit via Telephone Note  I connected with [name] on 06/27/22 at  2:40 PM EDT by telephone and verified that I am speaking with the correct person using two identifiers.  Location: Patient: home Provider: office   I discussed the limitations, risks, security and privacy concerns of performing an evaluation and management service by telephone and the availability of in person appointments. I also discussed with the patient that there may be a patient responsible charge related to this service. The patient expressed understanding and agreed to proceed.      I discussed the assessment and treatment plan with the patient. The patient was provided an opportunity to ask questions and all were answered. The patient agreed with the plan and demonstrated an understanding of the instructions.   The patient was advised to call back or seek an in-person evaluation if the symptoms worsen or if the condition fails to improve as anticipated.  I provided 15 minutes of non-face-to-face time during this encounter.   [name], MD  [facility] MD/PA/NP OP Progress Note  06/27/2022 2:58 PM [name]  MRN:  [id number]  Chief Complaint:  Chief Complaint  Patient presents with   Anxiety   Depression   Follow-up   HPI: This patient is a 56 year old divorced white female who lives alone in [place].  She has no children and is unemployed.  She is on disability.  Patient returns for follow-up after 4 months.  She states that she is going to have to have another eye surgery to replace the lens in her right eye.  The when she is in now is working well and is causing irritation.  She is being seen at [facility].  This is put a limit on some of her activities.  Nevertheless she has been going to church and volunteering to stay in the children's room.  Yesterday she got very dizzy and could not go.  Her mood is still "up-and-down but better overall than it used to be.  She is sleeping somewhat  better and is no longer as fearful at night.  She denies suicidal ideation.  She continues to work with her therapist on self-esteem.  She states that she is lonely but it is hard for her to reach out to others.  I encouraged her to work on this with her therapist Visit Diagnosis:    ICD-10-CM   1. 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.2       Past Psychiatric History: Long-term outpatient treatment  Past Medical History:  Past Medical History:  Diagnosis Date   Anxiety    Arthritis    Asthma    Chronic kidney disease    IGA   Complication of anesthesia    Depression    Diabetes mellitus without complication (HCC)    type II    GERD (gastroesophageal reflux disease)    Heart murmur    since birth- no need to be concern   History of hiatal hernia    History of kidney stones    hx of    Hypertension    PONV (postoperative nausea and vomiting)    Shortness of breath    With exertion   Sleep apnea    severe sleep apnea uses oxygen 2l at nite     Past Surgical History:  Procedure Laterality Date   ABDOMINAL HYSTERECTOMY     BREAST SURGERY Right    biospy x2   CATARACT EXTRACTION  right eye   devaited spetum surgery      DILATION AND CURETTAGE OF UTERUS     ESOPHAGOGASTRODUODENOSCOPY (EGD) WITH PROPOFOL N/A 12/26/2016   Procedure: ESOPHAGOGASTRODUODENOSCOPY (EGD) WITH PROPOFOL;  Surgeon: Luretha Murphy, MD;  Location: [facility];  Service: General;  Laterality: N/A;   ESOPHAGOGASTRODUODENOSCOPY (EGD) WITH PROPOFOL N/A 12/07/2017   Procedure: ESOPHAGOGASTRODUODENOSCOPY (EGD) WITH PROPOFOL;  Surgeon: Carman Ching, MD;  Location: [facility] ENDOSCOPY;  Service: Endoscopy;  Laterality: N/A;   EYE SURGERY  04/23/2019   right,Pupil is dilated   LAPAROSCOPIC NISSEN FUNDOPLICATION N/A 01/16/2017   Procedure: LAPAROSCOPIC ENTEROLYSIS TAKEDOWN OF PRIOR NISSEN FUNDOPLICATION WITH UPPER ENDOSCOPY;  Surgeon: Luretha Murphy, MD;  Location: [facility] ORS;   Service: General;  Laterality: N/A;   LUMBAR LAMINECTOMY/DECOMPRESSION MICRODISCECTOMY  10/23/2013   L4 L5   DR Shon Baton   LUMBAR LAMINECTOMY/DECOMPRESSION MICRODISCECTOMY N/A 10/23/2013   Procedure: DECOMPRESSION AND FACET REMOVAL L4 - L5 1 LEVEL;  Surgeon: Venita Lick, MD;  Location: [facility] OR;  Service: Orthopedics;  Laterality: N/A;   nasal repair collapse      both sides of nose and 2 plates added    NASAL SINUS SURGERY     NISSEN FUNDOPLICATION     right foot surgery      SEPTOPLASTY  2013   SHOULDER ARTHROSCOPY WITH ROTATOR CUFF REPAIR Right    x 2   SHOULDER ARTHROSCOPY WITH ROTATOR CUFF REPAIR Left 05/07/2020   Procedure: Left shoulder arthroscopy, subacromial decompression, distal clavicle resection, rotator cuff repair;  Surgeon: Francena Hanly, MD;  Location: [facility];  Service: Orthopedics;  Laterality: Left;    WRIST SURGERY Left    work injury    Family Psychiatric History: see below  Family History:  Family History  Problem Relation Age of Onset   Alcohol abuse Brother    Drug abuse Other    Stroke Mother    Hypertension Mother    Diabetes Mother    Kidney disease Mother    Lung cancer Father    Heart attack Sister    Lung cancer Brother     Social History:  Social History   Socioeconomic History   Marital status: Single    Spouse name: Not on file   Number of children: Not on file   Years of education: Not on file   Highest education level: Not on file  Occupational History   Not on file  Tobacco Use   Smoking status: Never   Smokeless tobacco: Never  Vaping Use   Vaping Use: Never used  Substance and Sexual Activity   Alcohol use: No   Drug use: No   Sexual activity: Never  Other Topics Concern   Not on file  Social History Narrative   Not on file   Social Determinants of Health   Financial Resource Strain: Not on file  Food Insecurity: Not on file  Transportation Needs: Not on file  Physical Activity: Not on file  Stress: Not on  file  Social Connections: Not on file    Allergies:  Allergies  Allergen Reactions   Bacitracin Rash   Adhesive [Tape] Other (See Comments)    Makes skin pull off *Band-aids and Plastic tape" pls use paper tape   Codeine Hives and Itching   Macrobid [Nitrofurantoin Monohyd Macro] Hives and Itching   Neosporin [Neomycin-Bacitracin Zn-Polymyx] Rash    Metabolic Disorder Labs: Lab Results  Component Value Date   HGBA1C 6.1 (H) 05/04/2020   MPG 128 05/04/2020   MPG 137  11/25/2016   No results found for: "PROLACTIN" No results found for: "CHOL", "TRIG", "HDL", "CHOLHDL", "VLDL", "LDLCALC" No results found for: "TSH"  Therapeutic Level Labs: No results found for: "LITHIUM" No results found for: "VALPROATE" No results found for: "CBMZ"  Current Medications: Current Outpatient Medications  Medication Sig Dispense Refill   meloxicam (MOBIC) 15 MG tablet Take 1 tablet by mouth daily as needed.     albuterol (PROVENTIL HFA;VENTOLIN HFA) 108 (90 Base) MCG/ACT inhaler Inhale 2 puffs into the lungs every 4 (four) hours as needed for wheezing or shortness of breath.     allopurinol (ZYLOPRIM) 100 MG tablet Take 100 mg by mouth daily with breakfast.      ALPRAZolam (XANAX) 1 MG tablet Take 1 tablet (1 mg total) by mouth 3 (three) times daily as needed for anxiety. 270 tablet 1   Ascorbic Acid (VITAMIN C) 1000 MG tablet Take 1,000 mg by mouth daily.     brimonidine (ALPHAGAN) 0.2 % ophthalmic solution Place 1 drop into the right eye in the morning, at noon, and at bedtime.     Cholecalciferol (VITAMIN D-3) 5000 units TABS Take 5,000 Units by mouth daily.     cyclobenzaprine (FLEXERIL) 10 MG tablet Take 1 tablet (10 mg total) by mouth at bedtime. 90 tablet 2   dorzolamide-timolol (COSOPT) 22.3-6.8 MG/ML ophthalmic solution Place 1 drop into the right eye in the morning and at bedtime.     DULoxetine (CYMBALTA) 60 MG capsule Take 1 capsule (60 mg total) by mouth 2 (two) times daily. 360  capsule 2   folic acid (FOLVITE) 1 MG tablet Take 1 mg by mouth 2 (two) times daily.     furosemide (LASIX) 20 MG tablet Take 20 mg by mouth daily.     glipiZIDE (GLUCOTROL XL) 2.5 MG 24 hr tablet Take 2.5 mg by mouth daily with breakfast.     hydrOXYzine (VISTARIL) 100 MG capsule Take 1 capsule (100 mg total) by mouth at bedtime. 90 capsule 2   levocetirizine (XYZAL) 5 MG tablet Take 5 mg by mouth at bedtime.     linaclotide (LINZESS) 290 MCG CAPS capsule Take 290 mcg by mouth daily as needed (constipation).      metFORMIN (GLUCOPHAGE-XR) 500 MG 24 hr tablet Take 500 mg by mouth daily with supper.     metoprolol succinate (TOPROL-XL) 100 MG 24 hr tablet Take 100 mg by mouth daily. Take with or immediately following a meal.     ondansetron (ZOFRAN) 4 MG tablet Take 4 mg by mouth every 8 (eight) hours as needed for nausea or vomiting.     OXYGEN Inhale 2 L into the lungs at bedtime.     pantoprazole (PROTONIX) 40 MG tablet Take 40 mg by mouth 2 (two) times daily.      polyethylene glycol (MIRALAX / GLYCOLAX) 17 g packet Take 17 g by mouth 4 (four) times a week.      potassium chloride (K-DUR) 10 MEQ tablet Take 10 mEq by mouth 3 (three) times daily.      prednisoLONE acetate (PRED FORTE) 1 % ophthalmic suspension Place 1 drop into the right eye in the morning and at bedtime.      spironolactone-hydrochlorothiazide (ALDACTAZIDE) 25-25 MG tablet Take 1 tablet by mouth daily.     zinc gluconate 50 MG tablet Take 50 mg by mouth daily.     zolpidem (AMBIEN) 10 MG tablet Take 1 tablet (10 mg total) by mouth at bedtime. 90 tablet 2  No current facility-administered medications for this visit.     Musculoskeletal: Strength & Muscle Tone: na Gait & Station: na Patient leans: N/A  Psychiatric Specialty Exam: Review of Systems  Eyes:  Positive for visual disturbance.  Musculoskeletal:  Positive for arthralgias.  Psychiatric/Behavioral:  The patient is nervous/anxious.   All other systems  reviewed and are negative.   There were no vitals taken for this visit.There is no height or weight on file to calculate BMI.  General Appearance: NA  Eye Contact:  NA  Speech:  Clear and Coherent  Volume:  Normal  Mood:  Anxious  Affect:  NA  Thought Process:  Goal Directed  Orientation:  Full (Time, Place, and Person)  Thought Content: WDL   Suicidal Thoughts:  No  Homicidal Thoughts:  No  Memory:  Immediate;   Good Recent;   Good Remote;   Fair  Judgement:  Good  Insight:  Fair  Psychomotor Activity:  Decreased  Concentration:  Concentration: Poor and Attention Span: Poor  Recall:  Good  Fund of Knowledge: Good  Language: Good  Akathisia:  No  Handed:  Right  AIMS (if indicated): not done  Assets:  Communication Skills Desire for Improvement Resilience Social Support  ADL's:  Intact  Cognition: WNL  Sleep:  Fair   Screenings: PHQ2-9    Flowsheet Row [facility] Video Visit from 02/23/2022 in [facility] Video Visit from 11/22/2021 in [facility] Video Visit from 09[facility]22/2021 in [facility] Video Visit from 06/09/2021 in [facility]  PHQ-2 Total Score 2 2 2 5 5   PHQ-9 Total Score 9 8 8 12 18       Flowsheet Row Video Visit from 09/22/2021 in [facility] Video Visit from 06/09/2021 in [facility] Video Visit from 03/15/2021 in [facility]  C-SSRS RISK CATEGORY No Risk No Risk No Risk        Assessment and Plan: This patient is a 56 year old female with a history of chronic dysthymia depression insomnia and anxiety.  For the most part she is fairly stable but is making very incremental improvements in  socialization.  She will continue Cymbalta 60 mg twice daily for depression, hydroxyzine 100 mg as well as Ambien 10 mg at bedtime for sleep and Xanax 1 mg up to 3 times daily for anxiety.  She will return to see me in 4 months at her request  Collaboration of Care: Collaboration of Care: Primary Care Provider AEB notes will be shared with PCP at patient's request  Patient/Guardian was advised Release of Information must be obtained prior to any record release in order to collaborate their care with an outside provider. Patient/Guardian was advised if they have not already done so to contact the registration department to sign all necessary forms in order for 05/15/2021 to release information regarding their care.   Consent: Patient/Guardian gives verbal consent for treatment and assignment of benefits for services provided during this visit. Patient/Guardian expressed understanding and agreed to proceed.    41, MD 06/27/2022, 2:58 PM

## 2022-10-27 ENCOUNTER — Telehealth (HOSPITAL_COMMUNITY): Payer: Medicare HMO | Admitting: Psychiatry

## 2022-11-30 ENCOUNTER — Encounter (HOSPITAL_COMMUNITY): Payer: Self-pay | Admitting: Psychiatry

## 2022-11-30 ENCOUNTER — Telehealth (INDEPENDENT_AMBULATORY_CARE_PROVIDER_SITE_OTHER): Payer: Medicare HMO | Admitting: Psychiatry

## 2022-11-30 DIAGNOSIS — F332 Major depressive disorder, recurrent severe without psychotic features: Secondary | ICD-10-CM

## 2022-11-30 MED ORDER — ZOLPIDEM TARTRATE 10 MG PO TABS: 10.0000 mg | ORAL_TABLET | Freq: Every day | ORAL | 2 refills | Status: DC

## 2022-11-30 MED ORDER — DULOXETINE HCL 60 MG PO CPEP: 60.0000 mg | ORAL_CAPSULE | Freq: Two times a day (BID) | ORAL | 2 refills | Status: DC

## 2022-11-30 MED ORDER — ALPRAZOLAM 1 MG PO TABS: 1.0000 mg | ORAL_TABLET | Freq: Three times a day (TID) | ORAL | 1 refills | Status: DC | PRN

## 2022-11-30 MED ORDER — HYDROXYZINE PAMOATE 100 MG PO CAPS: 100.0000 mg | ORAL_CAPSULE | Freq: Every day | ORAL | 2 refills | Status: DC

## 2022-11-30 NOTE — Progress Notes (Signed)
Virtual Visit via Video Note  I connected with Marcelino Scot on 11/30/22 at  2:40 PM EST by a video enabled telemedicine application and verified that I am speaking with the correct person using two identifiers.  Location: Patient: home Provider: office   I discussed the limitations of evaluation and management by telemedicine and the availability of in person appointments. The patient expressed understanding and agreed to proceed.     I discussed the assessment and treatment plan with the patient. The patient was provided an opportunity to ask questions and all were answered. The patient agreed with the plan and demonstrated an understanding of the instructions.   The patient was advised to call back or seek an in-person evaluation if the symptoms worsen or if the condition fails to improve as anticipated.  I provided 15 minutes of non-face-to-face time during this encounter.   [name], MD  [facility] MD/PA/NP OP Progress Note  11/30/2022 2:51 PM MIAYA LAFONTANT  MRN:  [id number]  Chief Complaint:  Chief Complaint  Patient presents with   Depression   Anxiety   Follow-up   HPI:  This patient is a 57 year old divorced white female who lives alone in [place].  She has no children and is unemployed.  She is on disability.   The patient returns for follow-up after about 5 months.  She states that she had another surgery for lens replacement and her eyes are very slow to improve.  She is also dealing with chronic vertigo.  She is not really getting out much right now because of all these issues and because of the cold.  She had to spend Christmas alone because her brother's family had COVID.  However she does think her medications for depression and anxiety have helped.  She is sleeping fairly well although not as much as she would like.  She continues to work with her therapist.  She denies any thoughts of self-harm or suicide. Visit Diagnosis:    ICD-10-CM   1. Major  depressive disorder, recurrent, severe without psychotic features (HCC)  F33.2       Past Psychiatric History: Long-term outpatient treatment  Past Medical History:  Past Medical History:  Diagnosis Date   Anxiety    Arthritis    Asthma    Chronic kidney disease    IGA   Complication of anesthesia    Depression    Diabetes mellitus without complication (HCC)    type II    GERD (gastroesophageal reflux disease)    Heart murmur    since birth- no need to be concern   History of hiatal hernia    History of kidney stones    hx of    Hypertension    PONV (postoperative nausea and vomiting)    Shortness of breath    With exertion   Sleep apnea    severe sleep apnea uses oxygen 2l at nite     Past Surgical History:  Procedure Laterality Date   ABDOMINAL HYSTERECTOMY     BREAST SURGERY Right    biospy x2   CATARACT EXTRACTION     right eye   devaited spetum surgery      DILATION AND CURETTAGE OF UTERUS     ESOPHAGOGASTRODUODENOSCOPY (EGD) WITH PROPOFOL N/A 12/26/2016   Procedure: ESOPHAGOGASTRODUODENOSCOPY (EGD) WITH PROPOFOL;  Surgeon: Luretha Murphy, MD;  Location: [facility];  Service: General;  Laterality: N/A;   ESOPHAGOGASTRODUODENOSCOPY (EGD) WITH PROPOFOL N/A 12/07/2017   Procedure: ESOPHAGOGASTRODUODENOSCOPY (EGD) WITH PROPOFOL;  Surgeon: Laurence Spates, MD;  Location: [facility];  Service: Endoscopy;  Laterality: N/A;   EYE SURGERY  04/23/2019   right,Pupil is dilated   LAPAROSCOPIC NISSEN FUNDOPLICATION N/A 12/18/2681   Procedure: LAPAROSCOPIC ENTEROLYSIS TAKEDOWN OF PRIOR NISSEN FUNDOPLICATION WITH UPPER ENDOSCOPY;  Surgeon: Johnathan Hausen, MD;  Location: [facility];  Service: General;  Laterality: N/A;   LUMBAR LAMINECTOMY/DECOMPRESSION MICRODISCECTOMY  10/23/2013   L4 L5   DR Rolena Infante   LUMBAR LAMINECTOMY/DECOMPRESSION MICRODISCECTOMY N/A 10/23/2013   Procedure: DECOMPRESSION AND FACET REMOVAL L4 - L5 1 LEVEL;  Surgeon: Melina Schools, MD;  Location: [facility];  Service:  Orthopedics;  Laterality: N/A;   nasal repair collapse      both sides of nose and 2 plates added    NASAL SINUS SURGERY     NISSEN FUNDOPLICATION     right foot surgery      SEPTOPLASTY  2013   SHOULDER ARTHROSCOPY WITH ROTATOR CUFF REPAIR Right    x 2   SHOULDER ARTHROSCOPY WITH ROTATOR CUFF REPAIR Left 05/07/2020   Procedure: Left shoulder arthroscopy, subacromial decompression, distal clavicle resection, rotator cuff repair;  Surgeon: Justice Britain, MD;  Location: [facility];  Service: Orthopedics;  Laterality: Left;  173min   WRIST SURGERY Left    work injury    Family Psychiatric History: See below  Family History:  Family History  Problem Relation Age of Onset   Alcohol abuse Brother    Drug abuse Other    Stroke Mother    Hypertension Mother    Diabetes Mother    Kidney disease Mother    Lung cancer Father    Heart attack Sister    Lung cancer Brother     Social History:  Social History   Socioeconomic History   Marital status: Single    Spouse name: Not on file   Number of children: Not on file   Years of education: Not on file   Highest education level: Not on file  Occupational History   Not on file  Tobacco Use   Smoking status: Never   Smokeless tobacco: Never  Vaping Use   Vaping Use: Never used  Substance and Sexual Activity   Alcohol use: No   Drug use: No   Sexual activity: Never  Other Topics Concern   Not on file  Social History Narrative   Not on file   Social Determinants of Health   Financial Resource Strain: Not on file  Food Insecurity: Not on file  Transportation Needs: Not on file  Physical Activity: Not on file  Stress: Not on file  Social Connections: Not on file    Allergies:  Allergies  Allergen Reactions   Bacitracin Rash   Adhesive [Tape] Other (See Comments)    Makes skin pull off *Band-aids and Plastic tape" pls use paper tape   Codeine Hives and Itching   Macrobid [Nitrofurantoin Monohyd Macro] Hives and Itching    Neosporin [Neomycin-Bacitracin Zn-Polymyx] Rash    Metabolic Disorder Labs: Lab Results  Component Value Date   HGBA1C 6.1 (H) 05/04/2020   MPG 128 05/04/2020   MPG 137 11/25/2016   No results found for: "PROLACTIN" No results found for: "CHOL", "TRIG", "HDL", "CHOLHDL", "VLDL", "LDLCALC" No results found for: "TSH"  Therapeutic Level Labs: No results found for: "LITHIUM" No results found for: "VALPROATE" No results found for: "CBMZ"  Current Medications: Current Outpatient Medications  Medication Sig Dispense Refill   albuterol (PROVENTIL HFA;VENTOLIN HFA) 108 (90 Base) MCG/ACT inhaler Inhale  2 puffs into the lungs every 4 (four) hours as needed for wheezing or shortness of breath.     allopurinol (ZYLOPRIM) 100 MG tablet Take 100 mg by mouth daily with breakfast.      ALPRAZolam (XANAX) 1 MG tablet Take 1 tablet (1 mg total) by mouth 3 (three) times daily as needed for anxiety. 270 tablet 1   Ascorbic Acid (VITAMIN C) 1000 MG tablet Take 1,000 mg by mouth daily.     brimonidine (ALPHAGAN) 0.2 % ophthalmic solution Place 1 drop into the right eye in the morning, at noon, and at bedtime.     Cholecalciferol (VITAMIN D-3) 5000 units TABS Take 5,000 Units by mouth daily.     cyclobenzaprine (FLEXERIL) 10 MG tablet Take 1 tablet (10 mg total) by mouth at bedtime. 90 tablet 2   dorzolamide-timolol (COSOPT) 22.3-6.8 MG/ML ophthalmic solution Place 1 drop into the right eye in the morning and at bedtime.     DULoxetine (CYMBALTA) 60 MG capsule Take 1 capsule (60 mg total) by mouth 2 (two) times daily. 360 capsule 2   folic acid (FOLVITE) 1 MG tablet Take 1 mg by mouth 2 (two) times daily.     furosemide (LASIX) 20 MG tablet Take 20 mg by mouth daily.     glipiZIDE (GLUCOTROL XL) 2.5 MG 24 hr tablet Take 2.5 mg by mouth daily with breakfast.     hydrOXYzine (VISTARIL) 100 MG capsule Take 1 capsule (100 mg total) by mouth at bedtime. 90 capsule 2   levocetirizine (XYZAL) 5 MG tablet  Take 5 mg by mouth at bedtime.     linaclotide (LINZESS) 290 MCG CAPS capsule Take 290 mcg by mouth daily as needed (constipation).      meloxicam (MOBIC) 15 MG tablet Take 1 tablet by mouth daily as needed.     metFORMIN (GLUCOPHAGE-XR) 500 MG 24 hr tablet Take 500 mg by mouth daily with supper.     metoprolol succinate (TOPROL-XL) 100 MG 24 hr tablet Take 100 mg by mouth daily. Take with or immediately following a meal.     ondansetron (ZOFRAN) 4 MG tablet Take 4 mg by mouth every 8 (eight) hours as needed for nausea or vomiting.     OXYGEN Inhale 2 L into the lungs at bedtime.     pantoprazole (PROTONIX) 40 MG tablet Take 40 mg by mouth 2 (two) times daily.      polyethylene glycol (MIRALAX / GLYCOLAX) 17 g packet Take 17 g by mouth 4 (four) times a week.      potassium chloride (K-DUR) 10 MEQ tablet Take 10 mEq by mouth 3 (three) times daily.      prednisoLONE acetate (PRED FORTE) 1 % ophthalmic suspension Place 1 drop into the right eye in the morning and at bedtime.      spironolactone-hydrochlorothiazide (ALDACTAZIDE) 25-25 MG tablet Take 1 tablet by mouth daily.     zinc gluconate 50 MG tablet Take 50 mg by mouth daily.     zolpidem (AMBIEN) 10 MG tablet Take 1 tablet (10 mg total) by mouth at bedtime. 90 tablet 2   No current facility-administered medications for this visit.     Musculoskeletal: Strength & Muscle Tone: within normal limits Gait & Station: normal Patient leans: N/A  Psychiatric Specialty Exam: Review of Systems  Eyes:  Positive for visual disturbance.  Neurological:  Positive for light-headedness.  All other systems reviewed and are negative.   There were no vitals taken for this visit.There is  no height or weight on file to calculate BMI.  General Appearance: Casual and Fairly Groomed  Eye Contact:  Good  Speech:  Clear and Coherent  Volume:  Normal  Mood:  Dysphoric  Affect:  Congruent  Thought Process:  Goal Directed  Orientation:  Full (Time, Place,  and Person)  Thought Content: Rumination   Suicidal Thoughts:  No  Homicidal Thoughts:  No  Memory:  Immediate;   Good Recent;   Good Remote;   Good  Judgement:  Good  Insight:  Fair  Psychomotor Activity:  Decreased  Concentration:  Concentration: Good and Attention Span: Good  Recall:  Good  Fund of Knowledge: Good  Language: Good  Akathisia:  No  Handed:  Right  AIMS (if indicated): not done  Assets:  Communication Skills Desire for Improvement Resilience  ADL's:  Intact  Cognition: WNL  Sleep:  Fair   Screenings: PHQ2-9    Flowsheet Row Video Visit from 06/27/2022 in [facility] Video Visit from 02/23/2022 in [facility] Video Visit from 11/22/2021 in [facility] Video Visit from 09/22/2021 in [facility] Video Visit from 06/09/2021 in [facility]  PHQ-2 Total Score 2 2 2 5 5   PHQ-9 Total Score 9 8 8 12 18       Flowsheet Row Video Visit from 09/22/2021 in [facility] Video Visit from 06/09/2021 in [facility] Video Visit from 03/15/2021 in [facility] No Risk No Risk No Risk        Assessment and Plan: This patient is a 57 year old female with a history of chronic dysthymia depression insomnia and anxiety.  For the most part she is fairly stable.  She will continue Cymbalta 60 mg twice daily for depression, hydroxyzine 100 mg as well as Ambien 10 mg at bedtime for sleep and Xanax 1 mg up to 3 times daily for anxiety.  She will return to see me in 4 months  Collaboration of Care: Collaboration of Care: Primary Care Provider AEB notes will be shared with PCP at patient's request  Patient/Guardian was advised Release of  Information must be obtained prior to any record release in order to collaborate their care with an outside provider. Patient/Guardian was advised if they have not already done so to contact the registration department to sign all necessary forms in order for [place] to release information regarding their care.   Consent: Patient/Guardian gives verbal consent for treatment and assignment of benefits for services provided during this visit. Patient/Guardian expressed understanding and agreed to proceed.    Levonne Spiller, MD 11/30/2022, 2:51 PM

## 2023-01-29 ENCOUNTER — Other Ambulatory Visit (HOSPITAL_COMMUNITY): Payer: Self-pay | Admitting: Psychiatry

## 2023-02-07 ENCOUNTER — Telehealth (HOSPITAL_COMMUNITY): Payer: Self-pay | Admitting: *Deleted

## 2023-02-07 NOTE — Telephone Encounter (Signed)
Spoke with patient pharmacy and they stated that they can not recognize 360 dispense. Per pharmacy they need a 180 days supply with refills to get patient the mail order sent out. Please send a new script with 90 days supply to [facility]

## 2023-02-08 ENCOUNTER — Other Ambulatory Visit (HOSPITAL_COMMUNITY): Payer: Self-pay | Admitting: Psychiatry

## 2023-02-08 MED ORDER — DULOXETINE HCL 60 MG PO CPEP: 60.0000 mg | ORAL_CAPSULE | Freq: Two times a day (BID) | ORAL | 2 refills | Status: DC

## 2023-02-08 NOTE — Telephone Encounter (Signed)
DULoxetine (CYMBALTA) 60 MG capsule

## 2023-02-08 NOTE — Telephone Encounter (Signed)
sent 

## 2023-03-30 ENCOUNTER — Encounter (HOSPITAL_COMMUNITY): Payer: Self-pay | Admitting: Psychiatry

## 2023-03-30 ENCOUNTER — Telehealth (INDEPENDENT_AMBULATORY_CARE_PROVIDER_SITE_OTHER): Payer: Medicare HMO | Admitting: Psychiatry

## 2023-03-30 DIAGNOSIS — F332 Major depressive disorder, recurrent severe without psychotic features: Secondary | ICD-10-CM

## 2023-03-30 MED ORDER — HYDROXYZINE PAMOATE 100 MG PO CAPS: 100.0000 mg | ORAL_CAPSULE | Freq: Every day | ORAL | 2 refills | Status: DC

## 2023-03-30 MED ORDER — ALPRAZOLAM 1 MG PO TABS: 1.0000 mg | ORAL_TABLET | Freq: Three times a day (TID) | ORAL | 1 refills | Status: DC | PRN

## 2023-03-30 MED ORDER — DULOXETINE HCL 60 MG PO CPEP: 60.0000 mg | ORAL_CAPSULE | Freq: Two times a day (BID) | ORAL | 2 refills | Status: DC

## 2023-03-30 MED ORDER — ZOLPIDEM TARTRATE 10 MG PO TABS: 10.0000 mg | ORAL_TABLET | Freq: Every day | ORAL | 2 refills | Status: DC

## 2023-03-30 NOTE — Progress Notes (Signed)
Virtual Visit via Video Note  I connected with [name] on 03/30/23 at  2:00 PM EDT by a video enabled telemedicine application and verified that I am speaking with the correct person using two identifiers.  Location: Patient: home Provider: office   I discussed the limitations of evaluation and management by telemedicine and the availability of in person appointments. The patient expressed understanding and agreed to proceed.     I discussed the assessment and treatment plan with the patient. The patient was provided an opportunity to ask questions and all were answered. The patient agreed with the plan and demonstrated an understanding of the instructions.   The patient was advised to call back or seek an in-person evaluation if the symptoms worsen or if the condition fails to improve as anticipated.  I provided 15 minutes of non-face-to-face time during this encounter.   [name], MD  [facility] MD/PA/NP OP Progress Note  03/30/2023 2:17 PM [name]  MRN:  [id number]  Chief Complaint:  Chief Complaint  Patient presents with   Depression   Anxiety   Follow-up   HPI: This patient is a 57 year old divorced white female who lives alone in [place].  She has no children.  She is on disability.  The patient returns for follow-up after 4 months.  She states that she is doing about the same.  She still having problems with vertigo and is going to be getting a brain MRI.  As usual she is rather negative about everything.  She says her new neighbor has dogs the park all night and she is afraid to confront him.  She called animal control with she claims her not doing anything.  I urged her to stand up for herself because this is disrupting her sleep.  She could possibly call the sheriff if she can get up enough courage to do so.  She continues to work with her therapist.  She denies any thoughts of self-harm or suicide.  For the most part she thinks her depression and anxiety  are being helped by the medications. Visit Diagnosis:    ICD-10-CM   1. 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.2       Past Psychiatric History: Long-term outpatient treatment  Past Medical History:  Past Medical History:  Diagnosis Date   Anxiety    Arthritis    Asthma    Chronic kidney disease    IGA   Complication of anesthesia    Depression    Diabetes mellitus without complication (HCC)    type II    GERD (gastroesophageal reflux disease)    Heart murmur    since birth- no need to be concern   History of hiatal hernia    History of kidney stones    hx of    Hypertension    PONV (postoperative nausea and vomiting)    Shortness of breath    With exertion   Sleep apnea    severe sleep apnea uses oxygen 2l at nite     Past Surgical History:  Procedure Laterality Date   ABDOMINAL HYSTERECTOMY     BREAST SURGERY Right    biospy x2   CATARACT EXTRACTION     right eye   devaited spetum surgery      DILATION AND CURETTAGE OF UTERUS     ESOPHAGOGASTRODUODENOSCOPY (EGD) WITH PROPOFOL N/A 12/26/2016   Procedure: ESOPHAGOGASTRODUODENOSCOPY (EGD) WITH PROPOFOL;  Surgeon: Luretha Murphy, MD;  Location: [facility]  ENDOSCOPY;  Service: General;  Laterality: N/A;   ESOPHAGOGASTRODUODENOSCOPY (EGD) WITH PROPOFOL N/A 12/07/2017   Procedure: ESOPHAGOGASTRODUODENOSCOPY (EGD) WITH PROPOFOL;  Surgeon: Carman Ching, MD;  Location: [facility] ENDOSCOPY;  Service: Endoscopy;  Laterality: N/A;   EYE SURGERY  04/23/2019   right,Pupil is dilated   LAPAROSCOPIC NISSEN FUNDOPLICATION N/A 01/16/2017   Procedure: LAPAROSCOPIC ENTEROLYSIS TAKEDOWN OF PRIOR NISSEN FUNDOPLICATION WITH UPPER ENDOSCOPY;  Surgeon: Luretha Murphy, MD;  Location: [facility] ORS;  Service: General;  Laterality: N/A;   LUMBAR LAMINECTOMY/DECOMPRESSION MICRODISCECTOMY  10/23/2013   L4 L5   DR Shon Baton   LUMBAR LAMINECTOMY/DECOMPRESSION MICRODISCECTOMY N/A 10/23/2013   Procedure: DECOMPRESSION AND FACET  REMOVAL L4 - L5 1 LEVEL;  Surgeon: Venita Lick, MD;  Location: [facility] OR;  Service: Orthopedics;  Laterality: N/A;   nasal repair collapse      both sides of nose and 2 plates added    NASAL SINUS SURGERY     NISSEN FUNDOPLICATION     right foot surgery      SEPTOPLASTY  2013   SHOULDER ARTHROSCOPY WITH ROTATOR CUFF REPAIR Right    x 2   SHOULDER ARTHROSCOPY WITH ROTATOR CUFF REPAIR Left 05/07/2020   Procedure: Left shoulder arthroscopy, subacromial decompression, distal clavicle resection, rotator cuff repair;  Surgeon: Francena Hanly, MD;  Location: [facility] ORS;  Service: Orthopedics;  Laterality: Left;    WRIST SURGERY Left    work injury    Family Psychiatric History: See below  Family History:  Family History  Problem Relation Age of Onset   Alcohol abuse Brother    Drug abuse Other    Stroke Mother    Hypertension Mother    Diabetes Mother    Kidney disease Mother    Lung cancer Father    Heart attack Sister    Lung cancer Brother     Social History:  Social History   Socioeconomic History   Marital status: Single    Spouse name: Not on file   Number of children: Not on file   Years of education: Not on file   Highest education level: Not on file  Occupational History   Not on file  Tobacco Use   Smoking status: Never   Smokeless tobacco: Never  Vaping Use   Vaping Use: Never used  Substance and Sexual Activity   Alcohol use: No   Drug use: No   Sexual activity: Never  Other Topics Concern   Not on file  Social History Narrative   Not on file   Social Determinants of Health   Financial Resource Strain: Not on file  Food Insecurity: Not on file  Transportation Needs: Not on file  Physical Activity: Not on file  Stress: Not on file  Social Connections: Not on file    Allergies:  Allergies  Allergen Reactions   Bacitracin Rash   Adhesive [Tape] Other (See Comments)    Makes skin pull off *Band-aids and Plastic tape" pls use paper tape    Codeine Hives and Itching   Macrobid [Nitrofurantoin Monohyd Macro] Hives and Itching   Neosporin [Neomycin-Bacitracin Zn-Polymyx] Rash    Metabolic Disorder Labs: Lab Results  Component Value Date   HGBA1C 6.1 (H) 05/04/2020   MPG 128 05/04/2020   MPG 137 11/25/2016   No results found for: "PROLACTIN" No results found for: "CHOL", "TRIG", "HDL", "CHOLHDL", "VLDL", "LDLCALC" No results found for: "TSH"  Therapeutic Level Labs: No results found for: "LITHIUM" No results found for: "VALPROATE" No results found for: "CBMZ"  Current Medications: Current Outpatient Medications  Medication Sig Dispense Refill   albuterol (PROVENTIL HFA;VENTOLIN HFA) 108 (90 Base) MCG/ACT inhaler Inhale 2 puffs into the lungs every 4 (four) hours as needed for wheezing or shortness of breath.     allopurinol (ZYLOPRIM) 100 MG tablet Take 100 mg by mouth daily with breakfast.      ALPRAZolam (XANAX) 1 MG tablet Take 1 tablet (1 mg total) by mouth 3 (three) times daily as needed for anxiety. 270 tablet 1   Ascorbic Acid (VITAMIN C) 1000 MG tablet Take 1,000 mg by mouth daily.     brimonidine (ALPHAGAN) 0.2 % ophthalmic solution Place 1 drop into the right eye in the morning, at noon, and at bedtime.     Cholecalciferol (VITAMIN D-3) 5000 units TABS Take 5,000 Units by mouth daily.     cyclobenzaprine (FLEXERIL) 10 MG tablet Take 1 tablet (10 mg total) by mouth at bedtime. 90 tablet 2   dorzolamide-timolol (COSOPT) 22.3-6.8 MG/ML ophthalmic solution Place 1 drop into the right eye in the morning and at bedtime.     DULoxetine (CYMBALTA) 60 MG capsule Take 1 capsule (60 mg total) by mouth 2 (two) times daily. 180 capsule 2   folic acid (FOLVITE) 1 MG tablet Take 1 mg by mouth 2 (two) times daily.     furosemide (LASIX) 20 MG tablet Take 20 mg by mouth daily.     glipiZIDE (GLUCOTROL XL) 2.5 MG 24 hr tablet Take 2.5 mg by mouth daily with breakfast.     hydrOXYzine (VISTARIL) 100 MG capsule Take 1 capsule  (100 mg total) by mouth at bedtime. 90 capsule 2   levocetirizine (XYZAL) 5 MG tablet Take 5 mg by mouth at bedtime.     linaclotide (LINZESS) 290 MCG CAPS capsule Take 290 mcg by mouth daily as needed (constipation).      meloxicam (MOBIC) 15 MG tablet Take 1 tablet by mouth daily as needed.     metFORMIN (GLUCOPHAGE-XR) 500 MG 24 hr tablet Take 500 mg by mouth daily with supper.     metoprolol succinate (TOPROL-XL) 100 MG 24 hr tablet Take 100 mg by mouth daily. Take with or immediately following a meal.     ondansetron (ZOFRAN) 4 MG tablet Take 4 mg by mouth every 8 (eight) hours as needed for nausea or vomiting.     OXYGEN Inhale 2 L into the lungs at bedtime.     pantoprazole (PROTONIX) 40 MG tablet Take 40 mg by mouth 2 (two) times daily.      polyethylene glycol (MIRALAX / GLYCOLAX) 17 g packet Take 17 g by mouth 4 (four) times a week.      potassium chloride (K-DUR) 10 MEQ tablet Take 10 mEq by mouth 3 (three) times daily.      prednisoLONE acetate (PRED FORTE) 1 % ophthalmic suspension Place 1 drop into the right eye in the morning and at bedtime.      spironolactone-hydrochlorothiazide (ALDACTAZIDE) 25-25 MG tablet Take 1 tablet by mouth daily.     zinc gluconate 50 MG tablet Take 50 mg by mouth daily.     zolpidem (AMBIEN) 10 MG tablet Take 1 tablet (10 mg total) by mouth at bedtime. 90 tablet 2   No current facility-administered medications for this visit.     Musculoskeletal: Strength & Muscle Tone: within normal limits Gait & Station: normal Patient leans: N/A  Psychiatric Specialty Exam: Review of Systems  Eyes:  Positive for visual disturbance.  Neurological:  Vertigo  Psychiatric/Behavioral:  Positive for dysphoric mood and sleep disturbance.   All other systems reviewed and are negative.   There were no vitals taken for this visit.There is no height or weight on file to calculate BMI.  General Appearance: Casual and Fairly Groomed  Eye Contact:  Good   Speech:  Clear and Coherent  Volume:  Normal  Mood:  Dysphoric  Affect:  Flat  Thought Process:  Goal Directed  Orientation:  Full (Time, Place, and Person)  Thought Content: Rumination   Suicidal Thoughts:  No  Homicidal Thoughts:  No  Memory:  Immediate;   Good Recent;   Good Remote;   NA  Judgement:  Good  Insight:  Fair  Psychomotor Activity:  Decreased  Concentration:  Concentration: Fair and Attention Span: Fair  Recall:  Good  Fund of Knowledge: Good  Language: Good  Akathisia:  No  Handed:  Right  AIMS (if indicated): not done  Assets:  Communication Skills Desire for Improvement Resilience Social Support Talents/Skills  ADL's:  Intact  Cognition: WNL  Sleep:  Fair   Screenings: PHQ2-9    Flowsheet Row Video Visit from 06/27/2022 in [facility] [place] Video Visit from 02/23/2022 in [facility] at [place] Video Visit from 11/22/2021 in [facility] at [place] Video Visit from 09/22/2021 in [facility] at [place] Video Visit from 06/09/2021 in [facility] at [facility] Total Score 2 2 2 5 5   PHQ-9 Total Score 9 8 8 12 18       Flowsheet Row Video Visit from 09/22/2021 in [facility] at [place] Video Visit from 06/09/2021 in [facility] at [place] Video Visit from 03/15/2021 in [facility] at [place]  C-SSRS RISK CATEGORY No Risk No Risk No Risk        Assessment and Plan: This patient is a 57 year old female with a history of chronic dysthymia depression insomnia and anxiety.  For the most part she has been stable.  I urged her to stand up for herself in terms of her need for quiet at night.  For now she will continue Cymbalta 60 mg twice daily for depression, hydroxyzine 100 mg as well as Ambien 10 mg at bedtime  for sleep and Xanax 1 mg up to 3 times daily for anxiety.  She will return to see me in 4 months  Collaboration of Care: Collaboration of Care: Primary Care Provider AEB notes will be shared with PCP at patient's request  Patient/Guardian was advised Release of Information must be obtained prior to any record release in order to collaborate their care with an outside provider. Patient/Guardian was advised if they have not already done so to contact the registration department to sign all necessary forms in order for [place] to release information regarding their care.   Consent: Patient/Guardian gives verbal consent for treatment and assignment of benefits for services provided during this visit. Patient/Guardian expressed understanding and agreed to proceed.    [name], MD 03/30/2023, 2:17 PM

## 2023-08-07 ENCOUNTER — Encounter (HOSPITAL_COMMUNITY): Payer: Self-pay | Admitting: Psychiatry

## 2023-08-07 ENCOUNTER — Telehealth (INDEPENDENT_AMBULATORY_CARE_PROVIDER_SITE_OTHER): Payer: Medicare HMO | Admitting: Psychiatry

## 2023-08-07 DIAGNOSIS — F332 Major depressive disorder, recurrent severe without psychotic features: Secondary | ICD-10-CM | POA: Diagnosis not present

## 2023-08-07 MED ORDER — HYDROXYZINE PAMOATE 100 MG PO CAPS: 100.0000 mg | ORAL_CAPSULE | Freq: Every day | ORAL | 2 refills | Status: DC

## 2023-08-07 MED ORDER — ALPRAZOLAM 1 MG PO TABS: 1.0000 mg | ORAL_TABLET | Freq: Three times a day (TID) | ORAL | 1 refills | Status: DC | PRN

## 2023-08-07 MED ORDER — ZOLPIDEM TARTRATE 10 MG PO TABS: 10.0000 mg | ORAL_TABLET | Freq: Every day | ORAL | 2 refills | Status: DC

## 2023-08-07 MED ORDER — DULOXETINE HCL 60 MG PO CPEP: 60.0000 mg | ORAL_CAPSULE | Freq: Two times a day (BID) | ORAL | 2 refills | Status: DC

## 2023-08-07 NOTE — Progress Notes (Signed)
Virtual Visit via Video Note  I connected with [name] on 08/07/23 at  2:40 PM EDT by a video enabled telemedicine application and verified that I am speaking with the correct person using two identifiers.  Location: Patient: home Provider: office   I discussed the limitations of evaluation and management by telemedicine and the availability of in person appointments. The patient expressed understanding and agreed to proceed.    I discussed the assessment and treatment plan with the patient. The patient was provided an opportunity to ask questions and all were answered. The patient agreed with the plan and demonstrated an understanding of the instructions.   The patient was advised to call back or seek an in-person evaluation if the symptoms worsen or if the condition fails to improve as anticipated.  I provided 15 minutes of non-face-to-face time during this encounter.   [name], MD  [facility] MD/PA/NP OP Progress Note  08/07/2023 3:03 PM [name]  MRN:  [id number]  Chief Complaint:  Chief Complaint  Patient presents with   Depression   Anxiety   Follow-up   HPI: This patient is a 57 year old divorced white female who lives alone in [place].  She is on disability.  The patient returns for follow-up after 4 months.  She states that she is doing "about the same."  She still has health issues.  Her diabetes is under better control now that she is on Ozempic and she has also lost some weight.  She is still rather negative about a lot of things such as her neighbors dogs who parked and get into her yard.  She is also found that her lens replacement at her eye is not working even though it was done at [facility] and will probably have to be redone.  She is sleeping fairly well.  She is going to a church and has made a few friends.  She does still think that the medication has helped with her depression anxiety and sleep.  She denies any thoughts of suicide or self-harm Visit  Diagnosis:    ICD-10-CM   1. 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.2       Past Psychiatric History: Long-term outpatient treatment  Past Medical History:  Past Medical History:  Diagnosis Date   Anxiety    Arthritis    Asthma    Chronic kidney disease    IGA   Complication of anesthesia    Depression    Diabetes mellitus without complication (HCC)    type II    GERD (gastroesophageal reflux disease)    Heart murmur    since birth- no need to be concern   History of hiatal hernia    History of kidney stones    hx of    Hypertension    PONV (postoperative nausea and vomiting)    Shortness of breath    With exertion   Sleep apnea    severe sleep apnea uses oxygen 2l at nite     Past Surgical History:  Procedure Laterality Date   ABDOMINAL HYSTERECTOMY     BREAST SURGERY Right    biospy x2   CATARACT EXTRACTION     right eye   devaited spetum surgery      DILATION AND CURETTAGE OF UTERUS     ESOPHAGOGASTRODUODENOSCOPY (EGD) WITH PROPOFOL N/A 12/26/2016   Procedure: ESOPHAGOGASTRODUODENOSCOPY (EGD) WITH PROPOFOL;  Surgeon: Luretha Murphy, MD;  Location: [facility];  Service: General;  Laterality:  N/A;   ESOPHAGOGASTRODUODENOSCOPY (EGD) WITH PROPOFOL N/A 12/07/2017   Procedure: ESOPHAGOGASTRODUODENOSCOPY (EGD) WITH PROPOFOL;  Surgeon: Carman Ching, MD;  Location: [facility] ENDOSCOPY;  Service: Endoscopy;  Laterality: N/A;   EYE SURGERY  04/23/2019   right,Pupil is dilated   LAPAROSCOPIC NISSEN FUNDOPLICATION N/A 01/16/2017   Procedure: LAPAROSCOPIC ENTEROLYSIS TAKEDOWN OF PRIOR NISSEN FUNDOPLICATION WITH UPPER ENDOSCOPY;  Surgeon: Luretha Murphy, MD;  Location: [facility];  Service: General;  Laterality: N/A;   LUMBAR LAMINECTOMY/DECOMPRESSION MICRODISCECTOMY  10/23/2013   L4 L5   DR Shon Baton   LUMBAR LAMINECTOMY/DECOMPRESSION MICRODISCECTOMY N/A 10/23/2013   Procedure: DECOMPRESSION AND FACET REMOVAL L4 - L5 1 LEVEL;  Surgeon: Venita Lick, MD;  Location: [facility] OR;  Service: Orthopedics;  Laterality: N/A;   nasal repair collapse      both sides of nose and 2 plates added    NASAL SINUS SURGERY     NISSEN FUNDOPLICATION     right foot surgery      SEPTOPLASTY  2013   SHOULDER ARTHROSCOPY WITH ROTATOR CUFF REPAIR Right    x 2   SHOULDER ARTHROSCOPY WITH ROTATOR CUFF REPAIR Left 05/07/2020   Procedure: Left shoulder arthroscopy, subacromial decompression, distal clavicle resection, rotator cuff repair;  Surgeon: Francena Hanly, MD;  Location: [facility];  Service: Orthopedics;  Laterality: Left;    WRIST SURGERY Left    work injury    Family Psychiatric History: See below  Family History:  Family History  Problem Relation Age of Onset   Alcohol abuse Brother    Drug abuse Other    Stroke Mother    Hypertension Mother    Diabetes Mother    Kidney disease Mother    Lung cancer Father    Heart attack Sister    Lung cancer Brother     Social History:  Social History   Socioeconomic History   Marital status: Single    Spouse name: Not on file   Number of children: Not on file   Years of education: Not on file   Highest education level: Not on file  Occupational History   Not on file  Tobacco Use   Smoking status: Never   Smokeless tobacco: Never  Vaping Use   Vaping status: Never Used  Substance and Sexual Activity   Alcohol use: No   Drug use: No   Sexual activity: Never  Other Topics Concern   Not on file  Social History Narrative   Not on file   Social Determinants of Health   Financial Resource Strain: Not on file  Food Insecurity: Not on file  Transportation Needs: Not on file  Physical Activity: Not on file  Stress: Not on file  Social Connections: Unknown (03/27/2022)   Received from [facility]   Social Network    Social Network: Not on file    Allergies:  Allergies  Allergen Reactions   Bacitracin Rash   Adhesive [Tape] Other (See Comments)    Makes skin  pull off *Band-aids and Plastic tape" pls use paper tape   Codeine Hives and Itching   Macrobid [Nitrofurantoin Monohyd Macro] Hives and Itching   Neosporin [Neomycin-Bacitracin Zn-Polymyx] Rash    Metabolic Disorder Labs: Lab Results  Component Value Date   HGBA1C 6.1 (H) 05/04/2020   MPG 128 05/04/2020   MPG 137 11/25/2016   No results found for: "PROLACTIN" No results found for: "CHOL", "TRIG", "HDL", "CHOLHDL", "VLDL", "LDLCALC" No results found for: "TSH"  Therapeutic Level Labs: No results  found for: "LITHIUM" No results found for: "VALPROATE" No results found for: "CBMZ"  Current Medications: Current Outpatient Medications  Medication Sig Dispense Refill   albuterol (PROVENTIL HFA;VENTOLIN HFA) 108 (90 Base) MCG/ACT inhaler Inhale 2 puffs into the lungs every 4 (four) hours as needed for wheezing or shortness of breath.     allopurinol (ZYLOPRIM) 100 MG tablet Take 100 mg by mouth daily with breakfast.      ALPRAZolam (XANAX) 1 MG tablet Take 1 tablet (1 mg total) by mouth 3 (three) times daily as needed for anxiety. 270 tablet 1   Ascorbic Acid (VITAMIN C) 1000 MG tablet Take 1,000 mg by mouth daily.     brimonidine (ALPHAGAN) 0.2 % ophthalmic solution Place 1 drop into the right eye in the morning, at noon, and at bedtime.     Cholecalciferol (VITAMIN D-3) 5000 units TABS Take 5,000 Units by mouth daily.     cyclobenzaprine (FLEXERIL) 10 MG tablet Take 1 tablet (10 mg total) by mouth at bedtime. 90 tablet 2   dorzolamide-timolol (COSOPT) 22.3-6.8 MG/ML ophthalmic solution Place 1 drop into the right eye in the morning and at bedtime.     DULoxetine (CYMBALTA) 60 MG capsule Take 1 capsule (60 mg total) by mouth 2 (two) times daily. 180 capsule 2   folic acid (FOLVITE) 1 MG tablet Take 1 mg by mouth 2 (two) times daily.     furosemide (LASIX) 20 MG tablet Take 20 mg by mouth daily.     glipiZIDE (GLUCOTROL XL) 2.5 MG 24 hr tablet Take 2.5 mg by mouth daily with breakfast.      hydrOXYzine (VISTARIL) 100 MG capsule Take 1 capsule (100 mg total) by mouth at bedtime. 90 capsule 2   levocetirizine (XYZAL) 5 MG tablet Take 5 mg by mouth at bedtime.     linaclotide (LINZESS) 290 MCG CAPS capsule Take 290 mcg by mouth daily as needed (constipation).      meloxicam (MOBIC) 15 MG tablet Take 1 tablet by mouth daily as needed.     metFORMIN (GLUCOPHAGE-XR) 500 MG 24 hr tablet Take 500 mg by mouth daily with supper.     metoprolol succinate (TOPROL-XL) 100 MG 24 hr tablet Take 100 mg by mouth daily. Take with or immediately following a meal.     ondansetron (ZOFRAN) 4 MG tablet Take 4 mg by mouth every 8 (eight) hours as needed for nausea or vomiting.     OXYGEN Inhale 2 L into the lungs at bedtime.     pantoprazole (PROTONIX) 40 MG tablet Take 40 mg by mouth 2 (two) times daily.      polyethylene glycol (MIRALAX / GLYCOLAX) 17 g packet Take 17 g by mouth 4 (four) times a week.      potassium chloride (K-DUR) 10 MEQ tablet Take 10 mEq by mouth 3 (three) times daily.      prednisoLONE acetate (PRED FORTE) 1 % ophthalmic suspension Place 1 drop into the right eye in the morning and at bedtime.      spironolactone-hydrochlorothiazide (ALDACTAZIDE) 25-25 MG tablet Take 1 tablet by mouth daily.     zinc gluconate 50 MG tablet Take 50 mg by mouth daily.     zolpidem (AMBIEN) 10 MG tablet Take 1 tablet (10 mg total) by mouth at bedtime. 90 tablet 2   No current facility-administered medications for this visit.     Musculoskeletal: Strength & Muscle Tone: within normal limits Gait & Station: normal Patient leans: N/A  Psychiatric Specialty  Exam: Review of Systems  Eyes:  Positive for visual disturbance.  Neurological:  Positive for dizziness and headaches.  All other systems reviewed and are negative.   There were no vitals taken for this visit.There is no height or weight on file to calculate BMI.  General Appearance: Casual and Fairly Groomed  Eye Contact:  Good   Speech:  Clear and Coherent  Volume:  Decreased  Mood:  Dysphoric  Affect:  Flat  Thought Process:  Goal Directed  Orientation:  Full (Time, Place, and Person)  Thought Content: Rumination   Suicidal Thoughts:  No  Homicidal Thoughts:  No  Memory:  Immediate;   Good Recent;   Good Remote;   NA  Judgement:  Good  Insight:  Fair  Psychomotor Activity:  Decreased  Concentration:  Concentration: Good and Attention Span: Good  Recall:  Good  Fund of Knowledge: Good  Language: Good  Akathisia:  No  Handed:  Right  AIMS (if indicated): not done  Assets:  Communication Skills Desire for Improvement Resilience Social Support Talents/Skills  ADL's:  Intact  Cognition: WNL  Sleep:  Fair   Screenings: PHQ2-9    Flowsheet Row Video Visit from 06/27/2022 in [facility] [place] Video Visit from 02/23/2022 in [facility] [place] Video Visit from 11/22/2021 in [facility] at [place] Video Visit from 09/22/2021 in [facility] at [place] Video Visit from 06/09/2021 in [facility] at [facility] Total Score 2 2 2 5 5   PHQ-9 Total Score 9 8 8 12 18       Flowsheet Row Video Visit from 09/22/2021 in [facility] at [place] Video Visit from 06/09/2021 in [facility] at [place] Video Visit from 03/15/2021 in [facility] at [place]  C-SSRS RISK CATEGORY No Risk No Risk No Risk        Assessment and Plan: This patient is a 57 year old female with a history of chronic dysthymia depression insomnia and anxiety.  For the most part she has been stable.  She will continue Cymbalta 60 mg twice daily for depression, hydroxyzine 100 mg as well as Ambien 10 mg at bedtime for sleep and Xanax 1 mg up to 3 times daily for anxiety.  She will return to see me  in 4 months  Collaboration of Care: Collaboration of Care: Primary Care Provider AEB notes will be shared with PCP at patient's request  Patient/Guardian was advised Release of Information must be obtained prior to any record release in order to collaborate their care with an outside provider. Patient/Guardian was advised if they have not already done so to contact the registration department to sign all necessary forms in order for [place] to release information regarding their care.   Consent: Patient/Guardian gives verbal consent for treatment and assignment of benefits for services provided during this visit. Patient/Guardian expressed understanding and agreed to proceed.    [name], MD 08/07/2023, 3:03 PM

## 2023-12-07 ENCOUNTER — Encounter (HOSPITAL_COMMUNITY): Payer: Self-pay | Admitting: Psychiatry

## 2023-12-07 ENCOUNTER — Telehealth (HOSPITAL_COMMUNITY): Payer: Medicare HMO | Admitting: Psychiatry

## 2023-12-07 DIAGNOSIS — F332 Major depressive disorder, recurrent severe without psychotic features: Secondary | ICD-10-CM

## 2023-12-07 MED ORDER — ALPRAZOLAM 1 MG PO TABS: 1.0000 mg | ORAL_TABLET | Freq: Three times a day (TID) | ORAL | 1 refills | Status: DC | PRN

## 2023-12-07 MED ORDER — HYDROXYZINE PAMOATE 100 MG PO CAPS: 100.0000 mg | ORAL_CAPSULE | Freq: Every day | ORAL | 2 refills | Status: DC

## 2023-12-07 MED ORDER — DULOXETINE HCL 60 MG PO CPEP: 60.0000 mg | ORAL_CAPSULE | Freq: Two times a day (BID) | ORAL | 2 refills | Status: DC

## 2023-12-07 MED ORDER — ZOLPIDEM TARTRATE 10 MG PO TABS: 10.0000 mg | ORAL_TABLET | Freq: Every day | ORAL | 2 refills | Status: DC

## 2023-12-07 NOTE — Progress Notes (Signed)
Virtual Visit via Video Note  I connected with [name] on 12/07/23 at  1:20 PM EST by a video enabled telemedicine application and verified that I am speaking with the correct person using two identifiers.  Location: Patient: home Provider: office   I discussed the limitations of evaluation and management by telemedicine and the availability of in person appointments. The patient expressed understanding and agreed to proceed.      I discussed the assessment and treatment plan with the patient. The patient was provided an opportunity to ask questions and all were answered. The patient agreed with the plan and demonstrated an understanding of the instructions.   The patient was advised to call back or seek an in-person evaluation if the symptoms worsen or if the condition fails to improve as anticipated.  I provided 20 minutes of non-face-to-face time during this encounter.   [name], MD  [facility] MD/PA/NP OP Progress Note  12/07/2023 1:33 PM [name]  MRN:  [id number]  Chief Complaint:  Chief Complaint  Patient presents with   Depression   Anxiety   Follow-up   HPI: This patient is a 58 year old divorced white female who lives alone in [place].  She is on disability.  The patient returns for follow-up after 4 months regarding her depression and anxiety and insomnia.  She states that she is doing okay but not great.  She recently had lens replacement again on her right eye and she is starting to see a little bit better.  Her diabetes is under much better control.  She has been on Ozempic for several months and has lost 50 pounds.  However it does cause nausea for her.  She states her sleep is "up-and-down" and the medicines do help to some degree.  As usual she is rather negative but does admit that once he gets nicer out she will be going back to church.  She denies any thoughts of suicide or self-harm.  She is still seeing the same therapist Visit Diagnosis:     ICD-10-CM   1. 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.2       Past Psychiatric History: Long-term outpatient treatment  Past Medical History:  Past Medical History:  Diagnosis Date   Anxiety    Arthritis    Asthma    Chronic kidney disease    IGA   Complication of anesthesia    Depression    Diabetes mellitus without complication (HCC)    type II    GERD (gastroesophageal reflux disease)    Heart murmur    since birth- no need to be concern   History of hiatal hernia    History of kidney stones    hx of    Hypertension    PONV (postoperative nausea and vomiting)    Shortness of breath    With exertion   Sleep apnea    severe sleep apnea uses oxygen 2l at nite     Past Surgical History:  Procedure Laterality Date   ABDOMINAL HYSTERECTOMY     BREAST SURGERY Right    biospy x2   CATARACT EXTRACTION     right eye   devaited spetum surgery      DILATION AND CURETTAGE OF UTERUS     ESOPHAGOGASTRODUODENOSCOPY (EGD) WITH PROPOFOL N/A 12/26/2016   Procedure: ESOPHAGOGASTRODUODENOSCOPY (EGD) WITH PROPOFOL;  Surgeon: Luretha Murphy, MD;  Location: [facility];  Service: General;  Laterality: N/A;   ESOPHAGOGASTRODUODENOSCOPY (EGD) WITH  PROPOFOL N/A 12/07/2017   Procedure: ESOPHAGOGASTRODUODENOSCOPY (EGD) WITH PROPOFOL;  Surgeon: Carman Ching, MD;  Location: [facility];  Service: Endoscopy;  Laterality: N/A;   EYE SURGERY  04/23/2019   right,Pupil is dilated   LAPAROSCOPIC NISSEN FUNDOPLICATION N/A 01/16/2017   Procedure: LAPAROSCOPIC ENTEROLYSIS TAKEDOWN OF PRIOR NISSEN FUNDOPLICATION WITH UPPER ENDOSCOPY;  Surgeon: Luretha Murphy, MD;  Location: [facility];  Service: General;  Laterality: N/A;   LUMBAR LAMINECTOMY/DECOMPRESSION MICRODISCECTOMY  10/23/2013   L4 L5   DR Shon Baton   LUMBAR LAMINECTOMY/DECOMPRESSION MICRODISCECTOMY N/A 10/23/2013   Procedure: DECOMPRESSION AND FACET REMOVAL L4 - L5 1 LEVEL;  Surgeon: Venita Lick, MD;   Location: [facility] OR;  Service: Orthopedics;  Laterality: N/A;   nasal repair collapse      both sides of nose and 2 plates added    NASAL SINUS SURGERY     NISSEN FUNDOPLICATION     right foot surgery      SEPTOPLASTY  2013   SHOULDER ARTHROSCOPY WITH ROTATOR CUFF REPAIR Right    x 2   SHOULDER ARTHROSCOPY WITH ROTATOR CUFF REPAIR Left 05/07/2020   Procedure: Left shoulder arthroscopy, subacromial decompression, distal clavicle resection, rotator cuff repair;  Surgeon: Francena Hanly, MD;  Location: [facility];  Service: Orthopedics;  Laterality: Left;    WRIST SURGERY Left    work injury    Family Psychiatric History: See below  Family History:  Family History  Problem Relation Age of Onset   Alcohol abuse Brother    Drug abuse Other    Stroke Mother    Hypertension Mother    Diabetes Mother    Kidney disease Mother    Lung cancer Father    Heart attack Sister    Lung cancer Brother     Social History:  Social History   Socioeconomic History   Marital status: Single    Spouse name: Not on file   Number of children: Not on file   Years of education: Not on file   Highest education level: Not on file  Occupational History   Not on file  Tobacco Use   Smoking status: Never   Smokeless tobacco: Never  Vaping Use   Vaping status: Never Used  Substance and Sexual Activity   Alcohol use: No   Drug use: No   Sexual activity: Never  Other Topics Concern   Not on file  Social History Narrative   Not on file   Social Drivers of Health   Financial Resource Strain: Not on file  Food Insecurity: Not on file  Transportation Needs: Not on file  Physical Activity: Not on file  Stress: Not on file  Social Connections: Unknown (03/27/2022)   [facility]   Social Network    Social Network: Not on file    Allergies:  Allergies  Allergen Reactions   Bacitracin Rash   Adhesive [Tape] Other (See Comments)    Makes skin pull off *Band-aids  and Plastic tape" pls use paper tape   Codeine Hives and Itching   Macrobid [Nitrofurantoin Monohyd Macro] Hives and Itching   Neosporin [Neomycin-Bacitracin Zn-Polymyx] Rash    Metabolic Disorder Labs: Lab Results  Component Value Date   HGBA1C 6.1 (H) 05/04/2020   MPG 128 05/04/2020   MPG 137 11/25/2016   No results found for: "PROLACTIN" No results found for: "CHOL", "TRIG", "HDL", "CHOLHDL", "VLDL", "LDLCALC" No results found for: "TSH"  Therapeutic Level Labs: No results found for: "LITHIUM" No results found  for: "VALPROATE" No results found for: "CBMZ"  Current Medications: Current Outpatient Medications  Medication Sig Dispense Refill   albuterol (PROVENTIL HFA;VENTOLIN HFA) 108 (90 Base) MCG/ACT inhaler Inhale 2 puffs into the lungs every 4 (four) hours as needed for wheezing or shortness of breath.     allopurinol (ZYLOPRIM) 100 MG tablet Take 100 mg by mouth daily with breakfast.      ALPRAZolam (XANAX) 1 MG tablet Take 1 tablet (1 mg total) by mouth 3 (three) times daily as needed for anxiety. 270 tablet 1   Ascorbic Acid (VITAMIN C) 1000 MG tablet Take 1,000 mg by mouth daily.     brimonidine (ALPHAGAN) 0.2 % ophthalmic solution Place 1 drop into the right eye in the morning, at noon, and at bedtime.     Cholecalciferol (VITAMIN D-3) 5000 units TABS Take 5,000 Units by mouth daily.     cyclobenzaprine (FLEXERIL) 10 MG tablet Take 1 tablet (10 mg total) by mouth at bedtime. 90 tablet 2   dorzolamide-timolol (COSOPT) 22.3-6.8 MG/ML ophthalmic solution Place 1 drop into the right eye in the morning and at bedtime.     DULoxetine (CYMBALTA) 60 MG capsule Take 1 capsule (60 mg total) by mouth 2 (two) times daily. 180 capsule 2   folic acid (FOLVITE) 1 MG tablet Take 1 mg by mouth 2 (two) times daily.     furosemide (LASIX) 20 MG tablet Take 20 mg by mouth daily.     glipiZIDE (GLUCOTROL XL) 2.5 MG 24 hr tablet Take 2.5 mg by mouth daily with breakfast.     hydrOXYzine  (VISTARIL) 100 MG capsule Take 1 capsule (100 mg total) by mouth at bedtime. 90 capsule 2   levocetirizine (XYZAL) 5 MG tablet Take 5 mg by mouth at bedtime.     linaclotide (LINZESS) 290 MCG CAPS capsule Take 290 mcg by mouth daily as needed (constipation).      meloxicam (MOBIC) 15 MG tablet Take 1 tablet by mouth daily as needed.     metFORMIN (GLUCOPHAGE-XR) 500 MG 24 hr tablet Take 500 mg by mouth daily with supper.     metoprolol succinate (TOPROL-XL) 100 MG 24 hr tablet Take 100 mg by mouth daily. Take with or immediately following a meal.     ondansetron (ZOFRAN) 4 MG tablet Take 4 mg by mouth every 8 (eight) hours as needed for nausea or vomiting.     OXYGEN Inhale 2 L into the lungs at bedtime.     pantoprazole (PROTONIX) 40 MG tablet Take 40 mg by mouth 2 (two) times daily.      polyethylene glycol (MIRALAX / GLYCOLAX) 17 g packet Take 17 g by mouth 4 (four) times a week.      potassium chloride (K-DUR) 10 MEQ tablet Take 10 mEq by mouth 3 (three) times daily.      prednisoLONE acetate (PRED FORTE) 1 % ophthalmic suspension Place 1 drop into the right eye in the morning and at bedtime.      spironolactone-hydrochlorothiazide (ALDACTAZIDE) 25-25 MG tablet Take 1 tablet by mouth daily.     zinc gluconate 50 MG tablet Take 50 mg by mouth daily.     zolpidem (AMBIEN) 10 MG tablet Take 1 tablet (10 mg total) by mouth at bedtime. 90 tablet 2   No current facility-administered medications for this visit.     Musculoskeletal: Strength & Muscle Tone: within normal limits Gait & Station: normal Patient leans: N/A  Psychiatric Specialty Exam: Review of Systems  Eyes:  Positive for visual disturbance.  Psychiatric/Behavioral:  Positive for sleep disturbance. The patient is nervous/anxious.   All other systems reviewed and are negative.   There were no vitals taken for this visit.There is no height or weight on file to calculate BMI.  General Appearance: Casual and Fairly Groomed  Eye  Contact:  Good  Speech:  Clear and Coherent  Volume:  Decreased  Mood:  Dysphoric  Affect:  Flat  Thought Process:  Goal Directed  Orientation:  Full (Time, Place, and Person)  Thought Content: Rumination   Suicidal Thoughts:  No  Homicidal Thoughts:  No  Memory:  Immediate;   Good Recent;   Good Remote;   Fair  Judgement:  Good  Insight:  Fair  Psychomotor Activity:  Decreased  Concentration:  Concentration: Good and Attention Span: Good  Recall:  Good  Fund of Knowledge: Good  Language: Good  Akathisia:  No  Handed:  Right  AIMS (if indicated): not done  Assets:  Communication Skills Resilience Social Support  ADL's:  Intact  Cognition: WNL  Sleep:  Fair   Screenings: PHQ2-9    Flowsheet Row Video Visit from 06/27/2022 in [facility] [place] Video Visit from 02/23/2022 in [facility] at [place] Video Visit from 11/22/2021 in [facility] at [place] Video Visit from 09/22/2021 in [facility] at [place] Video Visit from 06/09/2021 in [facility] at [facility] Total Score 2 2 2 5 5   PHQ-9 Total Score 9 8 8 12 18       Flowsheet Row Video Visit from 09/22/2021 in [facility] at [place] Video Visit from 06/09/2021 in [facility] at [place] Video Visit from 03/15/2021 in [facility] at [place]  C-SSRS RISK CATEGORY No Risk No Risk No Risk        Assessment and Plan: This patient is a 58 year old female with a history of chronic dysthymia depression insomnia and anxiety.  For the most part she has been stable.  She will continue Cymbalta 60 mg twice daily for depression hydroxyzine 100 mg as well as Ambien 10 mg at bedtime for sleep and Xanax 1 mg up to 3 times daily for anxiety.  She will return to see me in 4 months at her  request  Collaboration of Care: Collaboration of Care: Primary Care Provider AEB notes to be shared with PCP at patient's request  Patient/Guardian was advised Release of Information must be obtained prior to any record release in order to collaborate their care with an outside provider. Patient/Guardian was advised if they have not already done so to contact the registration department to sign all necessary forms in order for [place] to release information regarding their care.   Consent: Patient/Guardian gives verbal consent for treatment and assignment of benefits for services provided during this visit. Patient/Guardian expressed understanding and agreed to proceed.    [name], MD 12/07/2023, 1:33 PM

## 2023-12-22 ENCOUNTER — Telehealth (HOSPITAL_COMMUNITY): Payer: Self-pay | Admitting: *Deleted

## 2023-12-22 ENCOUNTER — Other Ambulatory Visit (HOSPITAL_COMMUNITY): Payer: Self-pay | Admitting: Psychiatry

## 2023-12-22 MED ORDER — DESVENLAFAXINE SUCCINATE ER 100 MG PO TB24: 100.0000 mg | ORAL_TABLET | Freq: Every day | ORAL | 3 refills | Status: DC

## 2023-12-22 NOTE — Telephone Encounter (Signed)
 I sent in Pristiq  which is a similar drug

## 2023-12-22 NOTE — Telephone Encounter (Signed)
 Patient called back about her Duloxetine  and it being recalled. Patient is fine with switching to another medication.

## 2023-12-22 NOTE — Telephone Encounter (Signed)
 Informed by provider to call patient about her Duloxetine  DR CAP 60mg . Per provider the medication serial number that patient have for this medication is coming back as a recall. Per provider to ask patient if she's okay with tartrate off of the medication or switching to another medication.    Staff called patient to inform her with this information and was not able to reach her. Staff left message for patient to call the office and office number provided as well.

## 2023-12-25 ENCOUNTER — Telehealth (HOSPITAL_COMMUNITY): Payer: Self-pay

## 2023-12-25 NOTE — Telephone Encounter (Signed)
 LMOM

## 2023-12-25 NOTE — Telephone Encounter (Signed)
 Medication management - Telephone call with patient twice today to assist with contacting her insurance and [employer] to complete a prior authorization for pt's precsribed Hydroxyzine  100 mg capsules. Medication was approved and informed pt they would be sending the medication out for mail delivery, once it is up to refill again.  Patient stated understanding and will let us  know if any further issues with filling medication as ordered.

## 2024-01-25 ENCOUNTER — Telehealth: Payer: Self-pay | Admitting: Cardiology

## 2024-01-25 NOTE — Telephone Encounter (Signed)
 Patient stated Dr. [place] can get records from her previous cardiologist  ([facility], [place], [place]), fax# [phone].  Ph# [phone].  Patient noted she also had tests done at [facility].

## 2024-02-02 ENCOUNTER — Other Ambulatory Visit: Payer: Self-pay | Admitting: Medical

## 2024-02-02 DIAGNOSIS — M25571 Pain in right ankle and joints of right foot: Secondary | ICD-10-CM

## 2024-02-05 ENCOUNTER — Ambulatory Visit
Admission: RE | Admit: 2024-02-05 | Discharge: 2024-02-05 | Disposition: A | Discharge status: Home / Self Care | Source: Ambulatory Visit | Attending: Medical | Admitting: Medical

## 2024-02-05 DIAGNOSIS — M25571 Pain in right ankle and joints of right foot: Secondary | ICD-10-CM

## 2024-03-23 NOTE — Progress Notes (Unsigned)
 Cardiology Office Note:    Date:  03/26/2024   ID:  [name], DOB 1966/05/20, MRN [id number]  PCP:  Quince Bryant, NP   [facility] Providers Cardiologist:  None     Referring MD: Quince Bryant, NP   Chief Complaint  Patient presents with   Palpitations    History of Present Illness:    [name] is a 58 y.o. female is seen at the request of Quince Bryant NP for cardiac evaluation. She has a history of DM type 2, HTN, HLD, morbid obesity with OSA. Previously seen by Cardiology- Dr Treasa Friend in [place], [place]. He is now retired.   She is concerned about her family history due to the fact that her sister died at age 11 apparently of a heart attack. The patient states she dose have some intermittent chest pain- infrequent and not associated with activity. She has some palpitations as well. She has had extensive cardiac evaluation. She reports that she had a cardiac cath within the last 10 years that was ok. Last year in July she had carotid dopplers, a Myoview study and Echo all of which looked OK. She is very limited due to multiple orthopedic problems. Reports varicose veins for which she has had RF ablation.   Past Medical History:  Diagnosis Date   Anxiety    Arthritis    Asthma    Chronic kidney disease    IGA   Complication of anesthesia    Depression    Diabetes mellitus without complication (HCC)    type II    GERD (gastroesophageal reflux disease)    Heart murmur    since birth- no need to be concern   History of hiatal hernia    History of kidney stones    hx of    Hypertension    PONV (postoperative nausea and vomiting)    Shortness of breath    With exertion   Sleep apnea    severe sleep apnea uses oxygen 2l at nite     Past Surgical History:  Procedure Laterality Date   ABDOMINAL HYSTERECTOMY     BREAST SURGERY Right    biospy x2   CATARACT EXTRACTION     right eye   devaited spetum surgery      DILATION AND CURETTAGE OF UTERUS      ESOPHAGOGASTRODUODENOSCOPY (EGD) WITH PROPOFOL  N/A 12/26/2016   Procedure: ESOPHAGOGASTRODUODENOSCOPY (EGD) WITH PROPOFOL ;  Surgeon: Jacolyn Matar, MD;  Location: [facility];  Service: General;  Laterality: N/A;   ESOPHAGOGASTRODUODENOSCOPY (EGD) WITH PROPOFOL  N/A 12/07/2017   Procedure: ESOPHAGOGASTRODUODENOSCOPY (EGD) WITH PROPOFOL ;  Surgeon: Jolinda Necessary, MD;  Location: [facility];  Service: Endoscopy;  Laterality: N/A;   EYE SURGERY  04/23/2019   right,Pupil is dilated   LAPAROSCOPIC NISSEN FUNDOPLICATION N/A 01/16/2017   Procedure: LAPAROSCOPIC ENTEROLYSIS TAKEDOWN OF PRIOR NISSEN FUNDOPLICATION WITH UPPER ENDOSCOPY;  Surgeon: Jacolyn Matar, MD;  Location: [facility];  Service: General;  Laterality: N/A;   LUMBAR LAMINECTOMY/DECOMPRESSION MICRODISCECTOMY  10/23/2013   L4 L5   DR Vaughn Georges   LUMBAR LAMINECTOMY/DECOMPRESSION MICRODISCECTOMY N/A 10/23/2013   Procedure: DECOMPRESSION AND FACET REMOVAL L4 - L5 1 LEVEL;  Surgeon: Mort Ards, MD;  Location: [facility];  Service: Orthopedics;  Laterality: N/A;   nasal repair collapse      both sides of nose and 2 plates added    NASAL SINUS SURGERY     NISSEN FUNDOPLICATION     right foot surgery  SEPTOPLASTY  2013   SHOULDER ARTHROSCOPY WITH ROTATOR CUFF REPAIR Right    x 2   SHOULDER ARTHROSCOPY WITH ROTATOR CUFF REPAIR Left 05/07/2020   Procedure: Left shoulder arthroscopy, subacromial decompression, distal clavicle resection, rotator cuff repair;  Surgeon: Ellard Gunning, MD;  Location: [facility];  Service: Orthopedics;  Laterality: Left;    WRIST SURGERY Left    work injury    Current Medications: Current Meds  Medication Sig   albuterol  (PROVENTIL  HFA;VENTOLIN  HFA) 108 (90 Base) MCG/ACT inhaler Inhale 2 puffs into the lungs every 4 (four) hours as needed for wheezing or shortness of breath.   allopurinol  (ZYLOPRIM ) 100 MG tablet Take 100 mg by mouth daily with breakfast.    ALPRAZolam  (XANAX ) 1 MG tablet Take 1 tablet (1 mg total)  by mouth 3 (three) times daily as needed for anxiety.   Ascorbic Acid (VITAMIN C) 1000 MG tablet Take 1,000 mg by mouth daily.   Cholecalciferol (VITAMIN D-3) 5000 units TABS Take 5,000 Units by mouth daily.   cyclobenzaprine  (FLEXERIL ) 10 MG tablet Take 1 tablet (10 mg total) by mouth at bedtime.   desvenlafaxine  (PRISTIQ ) 100 MG 24 hr tablet Take 1 tablet (100 mg total) by mouth daily.   diazepam (VALIUM) 2 MG tablet Take 2 mg by mouth as needed.   folic acid (FOLVITE) 1 MG tablet Take 1 mg by mouth 2 (two) times daily.   furosemide  (LASIX ) 20 MG tablet Take 20 mg by mouth daily.   hydrOXYzine  (ATARAX ) 50 MG tablet Take 50 mg by mouth as needed.   hydrOXYzine  (VISTARIL ) 100 MG capsule Take 1 capsule (100 mg total) by mouth at bedtime.   KLOR-CON  M20 20 MEQ tablet Take 20 mEq by mouth 3 (three) times daily.   latanoprost (XALATAN) 0.005 % ophthalmic solution Apply 1 drop to eye at bedtime.   levocetirizine (XYZAL ) 5 MG tablet Take 5 mg by mouth at bedtime.   linaclotide (LINZESS) 290 MCG CAPS capsule Take 290 mcg by mouth daily as needed (constipation).    meloxicam (MOBIC) 15 MG tablet Take 1 tablet by mouth daily as needed.   metFORMIN  (GLUCOPHAGE -XR) 500 MG 24 hr tablet Take 500 mg by mouth daily with supper.   metoprolol  succinate (TOPROL -XL) 100 MG 24 hr tablet Take 100 mg by mouth daily. Take with or immediately following a meal.   ondansetron  (ZOFRAN ) 4 MG tablet Take 4 mg by mouth every 8 (eight) hours as needed for nausea or vomiting.   OXYGEN Inhale 2 L into the lungs at bedtime.   pantoprazole  (PROTONIX ) 40 MG tablet Take 40 mg by mouth 2 (two) times daily.    prednisoLONE acetate (PRED FORTE) 1 % ophthalmic suspension Place 1 drop into the right eye in the morning and at bedtime.    Semaglutide,0.25 or 0.5MG /DOS, (OZEMPIC, 0.25 OR 0.5 MG/DOSE,) 2 MG/3ML SOPN once a week.   spironolactone -hydrochlorothiazide  (ALDACTAZIDE ) 25-25 MG tablet Take 1 tablet by mouth daily.   timolol  (TIMOPTIC) 0.5 % ophthalmic solution Place 1 drop into the right eye 2 (two) times daily.   zinc gluconate 50 MG tablet Take 50 mg by mouth daily.   zolpidem  (AMBIEN ) 10 MG tablet Take 1 tablet (10 mg total) by mouth at bedtime.     Allergies:   Polymyxin b-trimethoprim, Bacitracin, Adhesive [tape], Aspirin, Benzalkonium chloride, Cephalosporins, Ciprofloxacin, Codeine, Polymyxin b, Prednisone, Silicone, Macrobid [nitrofurantoin monohyd macro], and Neosporin [neomycin-bacitracin zn-polymyx]   Social History   Socioeconomic History   Marital status: Single  Spouse name: Not on file   Number of children: Not on file   Years of education: Not on file   Highest education level: Not on file  Occupational History   Not on file  Tobacco Use   Smoking status: Never   Smokeless tobacco: Never  Vaping Use   Vaping status: Never Used  Substance and Sexual Activity   Alcohol use: No   Drug use: No   Sexual activity: Never  Other Topics Concern   Not on file  Social History Narrative   Not on file   Social Drivers of Health   Financial Resource Strain: Not on file  Food Insecurity: Not on file  Transportation Needs: Not on file  Physical Activity: Not on file  Stress: Not on file  Social Connections: Unknown (03/27/2022)   [facility]   Social Network    Social Network: Not on file     Family History: The patient's family history includes Alcohol abuse in her brother; Diabetes in her mother; Drug abuse in an other family member; Heart attack in her sister; Hypertension in her mother; Kidney disease in her mother; Lung cancer in her brother and father; Stroke in her mother.  ROS:   Please see the history of present illness.     All other systems reviewed and are negative.  EKGs/Labs/Other Studies Reviewed:    The following studies were reviewed today: EKG Interpretation Date/Time:  Tuesday Mar 26 2024 15:59:51 EDT Ventricular Rate:  79 PR  Interval:  174 QRS Duration:  78 QT Interval:  388 QTC Calculation: 444 R Axis:   77  Text Interpretation: Normal sinus rhythm Nonspecific ST and T wave abnormality When compared with ECG of 18-Jan-2017 07:43, No significant change was found Confirmed by [name], Yue Flanigan [phone]) on 03/26/2024 4:02:01 PM  Recent Labs: No results found for requested labs within last 365 days.  Recent Lipid Panel No results found for: "CHOL", "TRIG", "HDL", "CHOLHDL", "VLDL", "LDLCALC", "LDLDIRECT" Dated 03/27/23: cholesterol 128, triglycerides 86, HDL 55, LDL 56. Hgb 13.5. creatinine 1.2. otherwise CMET normal.  Dated 08/03/23: A1c 6.3%  Risk Assessment/Calculations:                Physical Exam:    VS:  BP 116/80 (BP Location: Left Arm, Patient Position: Sitting)   Pulse 79   Ht 5\' 6"  (1.676 m)   Wt 194 lb 12.8 oz (88.4 kg)   SpO2 99%   BMI 31.44 kg/m     Wt Readings from Last 3 Encounters:  03/26/24 194 lb 12.8 oz (88.4 kg)  05/07/20 235 lb 8 oz (106.8 kg)  05/04/20 235 lb 8 oz (106.8 kg)     GEN:  Well nourished, well developed in no acute distress HEENT: Normal NECK: No JVD; No carotid bruits LYMPHATICS: No lymphadenopathy CARDIAC: RRR, no murmurs, rubs, gallops RESPIRATORY:  Clear to auscultation without rales, wheezing or rhonchi  ABDOMEN: Soft, non-tender, non-distended MUSCULOSKELETAL:  No edema; No deformity  SKIN: Warm and dry NEUROLOGIC:  Alert and oriented x 3 PSYCHIATRIC:  Normal affect   ASSESSMENT:    1. Chest pain, atypical   2. Hypertension, unspecified type   3. Palpitations    PLAN:    In order of problems listed above:  Chest pain, atypical. Patient has had extensive cardiac evaluation to date with no significant cardiac disease found. Reassured her. No additional evaluation warranted at this time.  Palpitations. Mild. Reports wearing monitors multiple times in the past.  Would recommend she continue current toprol  HTN controlled.            Medication  Adjustments/Labs and Tests Ordered: Current medicines are reviewed at length with the patient today.  Concerns regarding medicines are outlined above.  Orders Placed This Encounter  Procedures   EKG 12-Lead   No orders of the defined types were placed in this encounter.   Patient Instructions  Medication Instructions:   *If you need a refill on your cardiac medications before your next appointment, ple  Lab Work: None ordered  Testing/Procedures: None ordered  Follow-Up: At [facility], you and your health needs are our priority.  As part of our continuing mission to provide you with exceptional heart care, our providers are all part of one team.  This team includes your primary Cardiologist (physician) and Advanced Practice Providers or APPs (Physician Assistants and Nurse Practitioners) who all work together to provide you with the care you need, when you need it.  Your next appointment:  As needed    Provider:  Dr.Krrish Freund   We recommend signing up for the patient portal called "MyChart".  Sign up information is provided on this After Visit Summary.  MyChart is used to connect with patients for Virtual Visits (Telemedicine).  Patients are able to view lab/test results, encounter notes, upcoming appointments, etc.  Non-urgent messages can be sent to your provider as well.   To learn more about what you can do with MyChart, go to [email or web address].          Signed, [name] Metzger [place], MD  03/26/2024 4:24 PM    [facility]

## 2024-03-26 ENCOUNTER — Telehealth (HOSPITAL_COMMUNITY): Payer: Medicare HMO | Admitting: Psychiatry

## 2024-03-26 ENCOUNTER — Ambulatory Visit: Payer: Self-pay | Attending: Cardiology | Admitting: Cardiology

## 2024-03-26 VITALS — BP 116/80 | HR 79 | Ht 66.0 in | Wt 194.8 lb

## 2024-03-26 DIAGNOSIS — R002 Palpitations: Secondary | ICD-10-CM

## 2024-03-26 DIAGNOSIS — I10 Essential (primary) hypertension: Secondary | ICD-10-CM | POA: Diagnosis not present

## 2024-03-26 DIAGNOSIS — R0789 Other chest pain: Secondary | ICD-10-CM

## 2024-03-26 NOTE — Patient Instructions (Signed)
 Medication Instructions:  Continue same medications  Lab Work: None ordered  Testing/Procedures: None ordered  Follow-Up: At [facility], you and your health needs are our priority.  As part of our continuing mission to provide you with exceptional heart care, our providers are all part of one team.  This team includes your primary Cardiologist (physician) and Advanced Practice Providers or APPs (Physician Assistants and Nurse Practitioners) who all work together to provide you with the care you need, when you need it.  Your next appointment:  1 year     Call in Feb to schedule May appointment     Provider:  Jaylene Metz PA    [place]   We recommend signing up for the patient portal called "MyChart".  Sign up information is provided on this After Visit Summary.  MyChart is used to connect with patients for Virtual Visits (Telemedicine).  Patients are able to view lab/test results, encounter notes, upcoming appointments, etc.  Non-urgent messages can be sent to your provider as well.   To learn more about what you can do with MyChart, go to [email or web address].

## 2024-03-29 ENCOUNTER — Telehealth (HOSPITAL_COMMUNITY): Admitting: Psychiatry

## 2024-03-29 ENCOUNTER — Encounter (HOSPITAL_COMMUNITY): Payer: Self-pay | Admitting: Psychiatry

## 2024-03-29 DIAGNOSIS — F332 Major depressive disorder, recurrent severe without psychotic features: Secondary | ICD-10-CM

## 2024-03-29 MED ORDER — HYDROXYZINE PAMOATE 100 MG PO CAPS
100.0000 mg | ORAL_CAPSULE | Freq: Every day | ORAL | 2 refills | Status: DC
Start: 2024-03-29 — End: 2024-08-02

## 2024-03-29 MED ORDER — ALPRAZOLAM 1 MG PO TABS: 1.0000 mg | ORAL_TABLET | Freq: Three times a day (TID) | ORAL | 1 refills | Status: DC | PRN

## 2024-03-29 MED ORDER — DESVENLAFAXINE SUCCINATE ER 100 MG PO TB24: 100.0000 mg | ORAL_TABLET | Freq: Every day | ORAL | 3 refills | Status: DC

## 2024-03-29 MED ORDER — ZOLPIDEM TARTRATE 10 MG PO TABS: 10.0000 mg | ORAL_TABLET | Freq: Every day | ORAL | 3 refills | Status: DC

## 2024-03-29 NOTE — Progress Notes (Signed)
 Virtual Visit via Video Note  I connected with [name] on 03/29/24 at 10:20 AM EDT by a video enabled telemedicine application and verified that I am speaking with the correct person using two identifiers.  Location: Patient: home Provider: office   I discussed the limitations of evaluation and management by telemedicine and the availability of in person appointments. The patient expressed understanding and agreed to proceed.      I discussed the assessment and treatment plan with the patient. The patient was provided an opportunity to ask questions and all were answered. The patient agreed with the plan and demonstrated an understanding of the instructions.   The patient was advised to call back or seek an in-person evaluation if the symptoms worsen or if the condition fails to improve as anticipated.  I provided 20 minutes of non-face-to-face time during this encounter.   Alfredia Annas, MD  [facility] MD/PA/NP OP Progress Note  03/29/2024 10:33 AM [name]  MRN:  [id number]  Chief Complaint:  Chief Complaint  Patient presents with   Depression   Anxiety   Follow-up   HPI: This patient is a 58 year old divorced white female who lives alone in [place] .  She is on disability.  The patient returns for follow-up after 4 months regarding her depression and anxiety.  She states overall she is "about the same."  As usual her affect is flat and blunted her mood is slightly irritable.  She does think the medicines are helping with her sleep and anxiety.  She is not so sure about the depression but in any case she states she is "no worse.  She continues to work closely with her therapist Debria Fang.  She does not have much social interaction although she does FaceTime with her family.  She is sad today because we just went through Mother's Day and her mother's been gone more than 15 years and she really misses her.  She denies any thoughts of self-harm or suicide.  I encouraged her  to make some plans with her family to get together over the summer. Visit Diagnosis:    ICD-10-CM   1. 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.2       Past Psychiatric History: Long-term outpatient treatment  Past Medical History:  Past Medical History:  Diagnosis Date   Anxiety    Arthritis    Asthma    Chronic kidney disease    IGA   Complication of anesthesia    Depression    Diabetes mellitus without complication (HCC)    type II    GERD (gastroesophageal reflux disease)    Heart murmur    since birth- no need to be concern   History of hiatal hernia    History of kidney stones    hx of    Hypertension    PONV (postoperative nausea and vomiting)    Shortness of breath    With exertion   Sleep apnea    severe sleep apnea uses oxygen 2l at nite     Past Surgical History:  Procedure Laterality Date   ABDOMINAL HYSTERECTOMY     BREAST SURGERY Right    biospy x2   CATARACT EXTRACTION     right eye   devaited spetum surgery      DILATION AND CURETTAGE OF UTERUS     ESOPHAGOGASTRODUODENOSCOPY (EGD) WITH PROPOFOL  N/A 12/26/2016   Procedure: ESOPHAGOGASTRODUODENOSCOPY (EGD) WITH PROPOFOL ;  Surgeon: Jacolyn Matar, MD;  Location: [facility]  ENDOSCOPY;  Service: General;  Laterality: N/A;   ESOPHAGOGASTRODUODENOSCOPY (EGD) WITH PROPOFOL  N/A 12/07/2017   Procedure: ESOPHAGOGASTRODUODENOSCOPY (EGD) WITH PROPOFOL ;  Surgeon: Jolinda Necessary, MD;  Location: [facility] ENDOSCOPY;  Service: Endoscopy;  Laterality: N/A;   EYE SURGERY  04/23/2019   right,Pupil is dilated   LAPAROSCOPIC NISSEN FUNDOPLICATION N/A 01/16/2017   Procedure: LAPAROSCOPIC ENTEROLYSIS TAKEDOWN OF PRIOR NISSEN FUNDOPLICATION WITH UPPER ENDOSCOPY;  Surgeon: Jacolyn Matar, MD;  Location: [facility] ORS;  Service: General;  Laterality: N/A;   LUMBAR LAMINECTOMY/DECOMPRESSION MICRODISCECTOMY  10/23/2013   L4 L5   DR Vaughn Georges   LUMBAR LAMINECTOMY/DECOMPRESSION MICRODISCECTOMY N/A 10/23/2013    Procedure: DECOMPRESSION AND FACET REMOVAL L4 - L5 1 LEVEL;  Surgeon: Mort Ards, MD;  Location: [facility] OR;  Service: Orthopedics;  Laterality: N/A;   nasal repair collapse      both sides of nose and 2 plates added    NASAL SINUS SURGERY     NISSEN FUNDOPLICATION     right foot surgery      SEPTOPLASTY  2013   SHOULDER ARTHROSCOPY WITH ROTATOR CUFF REPAIR Right    x 2   SHOULDER ARTHROSCOPY WITH ROTATOR CUFF REPAIR Left 05/07/2020   Procedure: Left shoulder arthroscopy, subacromial decompression, distal clavicle resection, rotator cuff repair;  Surgeon: Ellard Gunning, MD;  Location: [facility] ORS;  Service: Orthopedics;  Laterality: Left;    WRIST SURGERY Left    work injury    Family Psychiatric History: See below  Family History:  Family History  Problem Relation Age of Onset   Alcohol abuse Brother    Drug abuse Other    Stroke Mother    Hypertension Mother    Diabetes Mother    Kidney disease Mother    Lung cancer Father    Heart attack Sister    Lung cancer Brother     Social History:  Social History   Socioeconomic History   Marital status: Single    Spouse name: Not on file   Number of children: Not on file   Years of education: Not on file   Highest education level: Not on file  Occupational History   Not on file  Tobacco Use   Smoking status: Never   Smokeless tobacco: Never  Vaping Use   Vaping status: Never Used  Substance and Sexual Activity   Alcohol use: No   Drug use: No   Sexual activity: Never  Other Topics Concern   Not on file  Social History Narrative   Not on file   Social Drivers of Health   Financial Resource Strain: Not on file  Food Insecurity: Not on file  Transportation Needs: Not on file  Physical Activity: Not on file  Stress: Not on file  Social Connections: Unknown (03/27/2022)   [facility]   Social Network    Social Network: Not on file    Allergies:  Allergies  Allergen Reactions    Polymyxin B-Trimethoprim Other (See Comments), Swelling and Itching   Bacitracin Rash   Adhesive [Tape] Other (See Comments)    Makes skin pull off *Band-aids and Plastic tape" pls use paper tape   Aspirin Other (See Comments)    Gastroparesis and kidney issues   AVOIDS   Gastroparesis and kidney issues  AVOIDS  aluminum aspirin  aspirin   Benzalkonium Chloride Dermatitis   Cephalosporins Dermatitis, Itching and Other (See Comments)   Ciprofloxacin Other (See Comments)   Codeine Hives and Itching   Polymyxin B Other (See  Comments)   Prednisone Other (See Comments)    Makes me "antsy" and heart racing   Makes me "antsy" and heart racing  prednisone   Silicone Other (See Comments)   Macrobid [Nitrofurantoin Monohyd Macro] Hives and Itching   Neosporin [Neomycin-Bacitracin Zn-Polymyx] Rash    Metabolic Disorder Labs: Lab Results  Component Value Date   HGBA1C 6.1 (H) 05/04/2020   MPG 128 05/04/2020   MPG 137 11/25/2016   No results found for: "PROLACTIN" No results found for: "CHOL", "TRIG", "HDL", "CHOLHDL", "VLDL", "LDLCALC" No results found for: "TSH"  Therapeutic Level Labs: No results found for: "LITHIUM" No results found for: "VALPROATE" No results found for: "CBMZ"  Current Medications: Current Outpatient Medications  Medication Sig Dispense Refill   albuterol  (PROVENTIL  HFA;VENTOLIN  HFA) 108 (90 Base) MCG/ACT inhaler Inhale 2 puffs into the lungs every 4 (four) hours as needed for wheezing or shortness of breath.     allopurinol  (ZYLOPRIM ) 100 MG tablet Take 100 mg by mouth daily with breakfast.      ALPRAZolam  (XANAX ) 1 MG tablet Take 1 tablet (1 mg total) by mouth 3 (three) times daily as needed for anxiety. 270 tablet 1   Ascorbic Acid (VITAMIN C) 1000 MG tablet Take 1,000 mg by mouth daily.     Cholecalciferol (VITAMIN D-3) 5000 units TABS Take 5,000 Units by mouth daily.     cyclobenzaprine  (FLEXERIL ) 10 MG tablet Take 1 tablet (10 mg total) by mouth at  bedtime. 90 tablet 2   desvenlafaxine  (PRISTIQ ) 100 MG 24 hr tablet Take 1 tablet (100 mg total) by mouth daily. 90 tablet 3   diazepam (VALIUM) 2 MG tablet Take 2 mg by mouth as needed.     folic acid (FOLVITE) 1 MG tablet Take 1 mg by mouth 2 (two) times daily.     furosemide  (LASIX ) 20 MG tablet Take 20 mg by mouth daily.     hydrOXYzine  (VISTARIL ) 100 MG capsule Take 1 capsule (100 mg total) by mouth at bedtime. 90 capsule 2   KLOR-CON  M20 20 MEQ tablet Take 20 mEq by mouth 3 (three) times daily.     latanoprost (XALATAN) 0.005 % ophthalmic solution Apply 1 drop to eye at bedtime.     levocetirizine (XYZAL ) 5 MG tablet Take 5 mg by mouth at bedtime.     linaclotide (LINZESS) 290 MCG CAPS capsule Take 290 mcg by mouth daily as needed (constipation).      meloxicam (MOBIC) 15 MG tablet Take 1 tablet by mouth daily as needed.     metFORMIN  (GLUCOPHAGE -XR) 500 MG 24 hr tablet Take 500 mg by mouth daily with supper.     metoprolol  succinate (TOPROL -XL) 100 MG 24 hr tablet Take 100 mg by mouth daily. Take with or immediately following a meal.     ondansetron  (ZOFRAN ) 4 MG tablet Take 4 mg by mouth every 8 (eight) hours as needed for nausea or vomiting.     OXYGEN Inhale 2 L into the lungs at bedtime.     pantoprazole  (PROTONIX ) 40 MG tablet Take 40 mg by mouth 2 (two) times daily.      prednisoLONE acetate (PRED FORTE) 1 % ophthalmic suspension Place 1 drop into the right eye in the morning and at bedtime.      Semaglutide,0.25 or 0.5MG /DOS, (OZEMPIC, 0.25 OR 0.5 MG/DOSE,) 2 MG/3ML SOPN once a week.     spironolactone -hydrochlorothiazide  (ALDACTAZIDE ) 25-25 MG tablet Take 1 tablet by mouth daily.     timolol (TIMOPTIC) 0.5 %  ophthalmic solution Place 1 drop into the right eye 2 (two) times daily.     zinc gluconate 50 MG tablet Take 50 mg by mouth daily.     zolpidem  (AMBIEN ) 10 MG tablet Take 1 tablet (10 mg total) by mouth at bedtime. 90 tablet 3   No current facility-administered medications  for this visit.     Musculoskeletal: Strength & Muscle Tone: within normal limits Gait & Station: normal Patient leans: N/A  Psychiatric Specialty Exam: Review of Systems  Eyes:  Positive for visual disturbance.  All other systems reviewed and are negative.   There were no vitals taken for this visit.There is no height or weight on file to calculate BMI.  General Appearance: Casual and Fairly Groomed  Eye Contact:  Good  Speech:  Clear and Coherent  Volume:  Decreased  Mood:  Dysphoric and Irritable  Affect:  Flat  Thought Process:  Goal Directed  Orientation:  Full (Time, Place, and Person)  Thought Content: Rumination   Suicidal Thoughts:  No  Homicidal Thoughts:  No  Memory:  Immediate;   Good Recent;   Good Remote;   Good  Judgement:  Good  Insight:  Fair  Psychomotor Activity:  Decreased  Concentration:  Concentration: Good and Attention Span: Good  Recall:  Good  Fund of Knowledge: Good  Language: Good  Akathisia:  No  Handed:  Right  AIMS (if indicated): not done  Assets:  Communication Skills Desire for Improvement Resilience Social Support  ADL's:  Intact  Cognition: WNL  Sleep:  Fair   Screenings: PHQ2-9    Flowsheet Row Video Visit from 06/27/2022 in [facility] at [place] Video Visit from 02/23/2022 in [facility] at [place] Video Visit from 11/22/2021 in [facility] at [place] Video Visit from 09/22/2021 in [facility] at [place] Video Visit from 06/09/2021 in [facility] at [facility] Total Score 2 2 2 5 5   PHQ-9 Total Score 9 8 8 12 18       Flowsheet Row Video Visit from 09/22/2021 in [facility] at [place] Video Visit from 06/09/2021 in [facility] at [place] Video Visit from 03/15/2021 in [facility]  at [place]  C-SSRS RISK CATEGORY No Risk No Risk No Risk        Assessment and Plan: This is a 58 year old female with a history of chronic dysthymia depression insomnia and anxiety.  For the most part she has been stable.  She will continue Pristiq  100 mg daily for depression, hydroxyzine  100 mg as well as Ambien  10 mg at bedtime for sleep and Xanax  1 mg up to 3 times daily for anxiety.  She will return to see me in 4 months at her request  Collaboration of Care: Collaboration of Care: Primary Care Provider AEB notes will be shared with PCP at patient's request  Patient/Guardian was advised Release of Information must be obtained prior to any record release in order to collaborate their care with an outside provider. Patient/Guardian was advised if they have not already done so to contact the registration department to sign all necessary forms in order for us  to release information regarding their care.   Consent: Patient/Guardian gives verbal consent for treatment and assignment of benefits for services provided during this visit. Patient/Guardian expressed understanding and agreed to proceed.    Alfredia Annas, MD 03/29/2024, 10:33 AM

## 2024-04-03 ENCOUNTER — Telehealth (HOSPITAL_COMMUNITY): Payer: Self-pay | Admitting: *Deleted

## 2024-04-03 NOTE — Telephone Encounter (Signed)
 Per provider to please call patient to see if she is taking both Cymbalta  and Pristiq . Per provider when patient found out there was a recall on Cymbalta , patient stated she did not want to take it anymore. That's when provider put her on Pristiq . Office received a faxed report from [employer] with the dates of when patient received both the Cymbalta  and the Pristiq . Per the fax sheet, patient medication for the Pristiq  was last filled on 03-03-2024 and the Cymbalta  was last filled on 02-11-2024. Provider wants staff to call patient to see if she is taking both medications and if she is to tell patient to only take the Pristiq .   Staff called patient and informed her with what's going on and what provider stated and she stated that she uses a mail order pharmacy and they mailed it to her and would not take it back. Per pt she is not taking the Cymbalta  she is only taking the Pristiq . Message will be sent to provider.

## 2024-04-03 NOTE — Telephone Encounter (Signed)
 Okay good , thanks for checking

## 2024-07-25 ENCOUNTER — Telehealth (HOSPITAL_COMMUNITY): Admitting: Psychiatry

## 2024-08-02 ENCOUNTER — Telehealth (HOSPITAL_COMMUNITY): Admitting: Psychiatry

## 2024-08-02 ENCOUNTER — Encounter (HOSPITAL_COMMUNITY): Payer: Self-pay | Admitting: Psychiatry

## 2024-08-02 DIAGNOSIS — F332 Major depressive disorder, recurrent severe without psychotic features: Secondary | ICD-10-CM | POA: Diagnosis not present

## 2024-08-02 MED ORDER — CYCLOBENZAPRINE HCL 10 MG PO TABS: 10.0000 mg | ORAL_TABLET | Freq: Every day | ORAL | 2 refills | Status: DC

## 2024-08-02 MED ORDER — HYDROXYZINE PAMOATE 100 MG PO CAPS: 100.0000 mg | ORAL_CAPSULE | Freq: Every day | ORAL | 2 refills | Status: DC

## 2024-08-02 MED ORDER — DESVENLAFAXINE SUCCINATE ER 100 MG PO TB24: 100.0000 mg | ORAL_TABLET | Freq: Every day | ORAL | 3 refills | Status: DC

## 2024-08-02 MED ORDER — ALPRAZOLAM 1 MG PO TABS: 1.0000 mg | ORAL_TABLET | Freq: Three times a day (TID) | ORAL | 1 refills | Status: DC | PRN

## 2024-08-02 MED ORDER — ZOLPIDEM TARTRATE 10 MG PO TABS: 10.0000 mg | ORAL_TABLET | Freq: Every day | ORAL | 3 refills | Status: DC

## 2024-08-02 NOTE — Progress Notes (Signed)
 Virtual Visit via Video Note  I connected with [name] on 08/02/24 at 10:20 AM EDT by a video enabled telemedicine application and verified that I am speaking with the correct person using two identifiers.  Location: Patient: home Provider: office   I discussed the limitations of evaluation and management by telemedicine and the availability of in person appointments. The patient expressed understanding and agreed to proceed.     I discussed the assessment and treatment plan with the patient. The patient was provided an opportunity to ask questions and all were answered. The patient agreed with the plan and demonstrated an understanding of the instructions.   The patient was advised to call back or seek an in-person evaluation if the symptoms worsen or if the condition fails to improve as anticipated.  I provided 20 minutes of non-face-to-face time during this encounter.   Barnie Gull, MD  [facility] MD/PA/NP OP Progress Note  08/02/2024 10:42 AM [name]  MRN:  [id number]  Chief Complaint:  Chief Complaint  Patient presents with   Depression   Anxiety   Follow-up   HPI: This patient is a 58 year old divorced white female who lives alone in [place] . She is on disability.   The patient returns after 4 months regarding her major depression and generalized anxiety disorder.  As usual she claims that nothing has changed.  She is very worried about her brother who is facing jail charges for DUI's.  She also has problems at her cousin's family with similar situations.  She states she is trying to stay away from the drama..  She states she is sleeping fairly well with the current medications.  She is still dealing with problems with her eyes.  Her general health is better since she got on Ozempic and has lost weight and her A1c is down to 6.0.  She still feels like her medications are helpful for her depression anxiety and sleep.  She denies any thoughts of self-harm or  suicide. Visit Diagnosis:    ICD-10-CM   1. 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.2       Past Psychiatric History: Long-term outpatient treatment  Past Medical History:  Past Medical History:  Diagnosis Date   Anxiety    Arthritis    Asthma    Chronic kidney disease    IGA   Complication of anesthesia    Depression    Diabetes mellitus without complication (HCC)    type II    GERD (gastroesophageal reflux disease)    Heart murmur    since birth- no need to be concern   History of hiatal hernia    History of kidney stones    hx of    Hypertension    PONV (postoperative nausea and vomiting)    Shortness of breath    With exertion   Sleep apnea    severe sleep apnea uses oxygen 2l at nite     Past Surgical History:  Procedure Laterality Date   ABDOMINAL HYSTERECTOMY     BREAST SURGERY Right    biospy x2   CATARACT EXTRACTION     right eye   devaited spetum surgery      DILATION AND CURETTAGE OF UTERUS     ESOPHAGOGASTRODUODENOSCOPY (EGD) WITH PROPOFOL  N/A 12/26/2016   Procedure: ESOPHAGOGASTRODUODENOSCOPY (EGD) WITH PROPOFOL ;  Surgeon: Donnice Lunger, MD;  Location: [facility] ENDOSCOPY;  Service: General;  Laterality: N/A;   ESOPHAGOGASTRODUODENOSCOPY (EGD) WITH PROPOFOL  N/A 12/07/2017  Procedure: ESOPHAGOGASTRODUODENOSCOPY (EGD) WITH PROPOFOL ;  Surgeon: Celestia Agent, MD;  Location: [facility] ENDOSCOPY;  Service: Endoscopy;  Laterality: N/A;   EYE SURGERY  04/23/2019   right,Pupil is dilated   LAPAROSCOPIC NISSEN FUNDOPLICATION N/A 01/16/2017   Procedure: LAPAROSCOPIC ENTEROLYSIS TAKEDOWN OF PRIOR NISSEN FUNDOPLICATION WITH UPPER ENDOSCOPY;  Surgeon: Donnice Lunger, MD;  Location: [facility] ORS;  Service: General;  Laterality: N/A;   LUMBAR LAMINECTOMY/DECOMPRESSION MICRODISCECTOMY  10/23/2013   L4 L5   DR BURNETTA   LUMBAR LAMINECTOMY/DECOMPRESSION MICRODISCECTOMY N/A 10/23/2013   Procedure: DECOMPRESSION AND FACET REMOVAL L4 - L5 1 LEVEL;   Surgeon: Donaciano BURNETTA, MD;  Location: [facility] OR;  Service: Orthopedics;  Laterality: N/A;   nasal repair collapse      both sides of nose and 2 plates added    NASAL SINUS SURGERY     NISSEN FUNDOPLICATION     right foot surgery      SEPTOPLASTY  2013   SHOULDER ARTHROSCOPY WITH ROTATOR CUFF REPAIR Right    x 2   SHOULDER ARTHROSCOPY WITH ROTATOR CUFF REPAIR Left 05/07/2020   Procedure: Left shoulder arthroscopy, subacromial decompression, distal clavicle resection, rotator cuff repair;  Surgeon: Melita Drivers, MD;  Location: [facility] ORS;  Service: Orthopedics;  Laterality: Left;    WRIST SURGERY Left    work injury    Family Psychiatric History: See below  Family History:  Family History  Problem Relation Age of Onset   Alcohol abuse Brother    Drug abuse Other    Stroke Mother    Hypertension Mother    Diabetes Mother    Kidney disease Mother    Lung cancer Father    Heart attack Sister    Lung cancer Brother     Social History:  Social History   Socioeconomic History   Marital status: Single    Spouse name: Not on file   Number of children: Not on file   Years of education: Not on file   Highest education level: Not on file  Occupational History   Not on file  Tobacco Use   Smoking status: Never   Smokeless tobacco: Never  Vaping Use   Vaping status: Never Used  Substance and Sexual Activity   Alcohol use: No   Drug use: No   Sexual activity: Never  Other Topics Concern   Not on file  Social History Narrative   Not on file   Social Drivers of Health   Financial Resource Strain: Not on file  Food Insecurity: Not on file  Transportation Needs: Not on file  Physical Activity: Not on file  Stress: Not on file  Social Connections: Unknown (03/27/2022)   [facility]   Social Network    Social Network: Not on file    Allergies:  Allergies  Allergen Reactions   Polymyxin B-Trimethoprim Other (See Comments), Swelling and Itching    Bacitracin Rash   Adhesive [Tape] Other (See Comments)    Makes skin pull off *Band-aids and Plastic tape pls use paper tape   Aspirin Other (See Comments)    Gastroparesis and kidney issues   AVOIDS   Gastroparesis and kidney issues  AVOIDS  aluminum aspirin  aspirin   Benzalkonium Chloride Dermatitis   Cephalosporins Dermatitis, Itching and Other (See Comments)   Ciprofloxacin Other (See Comments)   Codeine Hives and Itching   Polymyxin B Other (See Comments)   Prednisone Other (See Comments)    Makes me antsy and heart racing   Makes  me antsy and heart racing  prednisone   Silicone Other (See Comments)   Macrobid [Nitrofurantoin Monohyd Macro] Hives and Itching   Neosporin [Neomycin-Bacitracin Zn-Polymyx] Rash    Metabolic Disorder Labs: Lab Results  Component Value Date   HGBA1C 6.1 (H) 05/04/2020   MPG 128 05/04/2020   MPG 137 11/25/2016   No results found for: PROLACTIN No results found for: CHOL, TRIG, HDL, CHOLHDL, VLDL, LDLCALC No results found for: TSH  Therapeutic Level Labs: No results found for: LITHIUM No results found for: VALPROATE No results found for: CBMZ  Current Medications: Current Outpatient Medications  Medication Sig Dispense Refill   albuterol  (PROVENTIL  HFA;VENTOLIN  HFA) 108 (90 Base) MCG/ACT inhaler Inhale 2 puffs into the lungs every 4 (four) hours as needed for wheezing or shortness of breath.     allopurinol  (ZYLOPRIM ) 100 MG tablet Take 100 mg by mouth daily with breakfast.      ALPRAZolam  (XANAX ) 1 MG tablet Take 1 tablet (1 mg total) by mouth 3 (three) times daily as needed for anxiety. 270 tablet 1   Ascorbic Acid (VITAMIN C) 1000 MG tablet Take 1,000 mg by mouth daily.     Cholecalciferol (VITAMIN D-3) 5000 units TABS Take 5,000 Units by mouth daily.     cyclobenzaprine  (FLEXERIL ) 10 MG tablet Take 1 tablet (10 mg total) by mouth at bedtime. 90 tablet 2   desvenlafaxine  (PRISTIQ ) 100 MG 24 hr tablet  Take 1 tablet (100 mg total) by mouth daily. 90 tablet 3   diazepam (VALIUM) 2 MG tablet Take 2 mg by mouth as needed.     folic acid (FOLVITE) 1 MG tablet Take 1 mg by mouth 2 (two) times daily.     furosemide  (LASIX ) 20 MG tablet Take 20 mg by mouth daily.     hydrOXYzine  (VISTARIL ) 100 MG capsule Take 1 capsule (100 mg total) by mouth at bedtime. 90 capsule 2   KLOR-CON  M20 20 MEQ tablet Take 20 mEq by mouth 3 (three) times daily.     latanoprost (XALATAN) 0.005 % ophthalmic solution Apply 1 drop to eye at bedtime.     levocetirizine (XYZAL ) 5 MG tablet Take 5 mg by mouth at bedtime.     linaclotide (LINZESS) 290 MCG CAPS capsule Take 290 mcg by mouth daily as needed (constipation).      meloxicam (MOBIC) 15 MG tablet Take 1 tablet by mouth daily as needed.     metFORMIN  (GLUCOPHAGE -XR) 500 MG 24 hr tablet Take 500 mg by mouth daily with supper.     metoprolol  succinate (TOPROL -XL) 100 MG 24 hr tablet Take 100 mg by mouth daily. Take with or immediately following a meal.     ondansetron  (ZOFRAN ) 4 MG tablet Take 4 mg by mouth every 8 (eight) hours as needed for nausea or vomiting.     OXYGEN Inhale 2 L into the lungs at bedtime.     pantoprazole  (PROTONIX ) 40 MG tablet Take 40 mg by mouth 2 (two) times daily.      prednisoLONE acetate (PRED FORTE) 1 % ophthalmic suspension Place 1 drop into the right eye in the morning and at bedtime.      Semaglutide,0.25 or 0.5MG /DOS, (OZEMPIC, 0.25 OR 0.5 MG/DOSE,) 2 MG/3ML SOPN once a week.     spironolactone -hydrochlorothiazide  (ALDACTAZIDE ) 25-25 MG tablet Take 1 tablet by mouth daily.     timolol (TIMOPTIC) 0.5 % ophthalmic solution Place 1 drop into the right eye 2 (two) times daily.     zinc gluconate  50 MG tablet Take 50 mg by mouth daily.     zolpidem  (AMBIEN ) 10 MG tablet Take 1 tablet (10 mg total) by mouth at bedtime. 90 tablet 3   No current facility-administered medications for this visit.     Musculoskeletal: Strength & Muscle Tone:  within normal limits Gait & Station: normal Patient leans: N/A  Psychiatric Specialty Exam: Review of Systems  Eyes:  Positive for visual disturbance.  All other systems reviewed and are negative.   There were no vitals taken for this visit.There is no height or weight on file to calculate BMI.  General Appearance: Casual and Fairly Groomed  Eye Contact:  Minimal  Speech: Clear and coherent  Volume:  Decreased  Mood:  flat  Affect:  Blunt  Thought Process:  Goal Directed  Orientation:  Full (Time, Place, and Person)  Thought Content: Rumination   Suicidal Thoughts:  No  Homicidal Thoughts:  No  Memory:  Immediate;   Good Recent;   Good Remote;   NA  Judgement:  Good  Insight:  Fair  Psychomotor Activity:  Decreased  Concentration:  Concentration: Good and Attention Span: Good  Recall:  Good  Fund of Knowledge: Good  Language: Good  Akathisia:  No  Handed:  Right  AIMS (if indicated): not done  Assets:  Communication Skills Desire for Improvement Resilience Social Support  ADL's:  Intact  Cognition: WNL  Sleep:  Fair   Screenings: PHQ2-9    Flowsheet Row Video Visit from 06/27/2022 in [facility] [place] Video Visit from 02/23/2022 in [facility] at [place] Video Visit from 11/22/2021 in [facility] at [place] Video Visit from 09/22/2021 in [facility] at [place] Video Visit from 06/09/2021 in [facility] at [facility] Total Score 2 2 2 5 5   PHQ-9 Total Score 9 8 8 12 18    Flowsheet Row Video Visit from 09/22/2021 in [facility] at [place] Video Visit from 06/09/2021 in [facility] at [place] Video Visit from 03/15/2021 in [facility] at [place]  C-SSRS RISK CATEGORY No Risk No Risk No Risk     Assessment and Plan:  This patient is a 58 year old female with a history of chronic dysthymia depression insomnia and generalized anxiety disorder.  For the most part she has been stable.  She will continue Pristiq  100 mg daily for depression hydroxyzine  100 mg at bedtime as well as Ambien  10 mg at bedtime for sleep, Xanax  1 mg up to 3 times daily for anxiety and tizanidine 10 mg at bedtime as needed for muscle spasm.  She will return to see me in 4 months at her request  Collaboration of Care: Collaboration of Care: Primary Care Provider AEB notes will be shared with PCP at patient's request  Patient/Guardian was advised Release of Information must be obtained prior to any record release in order to collaborate their care with an outside provider. Patient/Guardian was advised if they have not already done so to contact the registration department to sign all necessary forms in order for us  to release information regarding their care.   Consent: Patient/Guardian gives verbal consent for treatment and assignment of benefits for services provided during this visit. Patient/Guardian expressed understanding and agreed to proceed.    Barnie Gull, MD 08/02/2024, 10:42 AM

## 2024-08-05 ENCOUNTER — Other Ambulatory Visit (HOSPITAL_COMMUNITY): Payer: Self-pay | Admitting: Surgery

## 2024-08-05 DIAGNOSIS — Z9889 Other specified postprocedural states: Secondary | ICD-10-CM

## 2024-08-05 DIAGNOSIS — K5909 Other constipation: Secondary | ICD-10-CM

## 2024-08-05 DIAGNOSIS — K449 Diaphragmatic hernia without obstruction or gangrene: Secondary | ICD-10-CM

## 2024-08-06 ENCOUNTER — Other Ambulatory Visit: Payer: Self-pay | Admitting: Surgery

## 2024-08-06 DIAGNOSIS — R140 Abdominal distension (gaseous): Secondary | ICD-10-CM

## 2024-08-06 DIAGNOSIS — R1012 Left upper quadrant pain: Secondary | ICD-10-CM

## 2024-08-06 DIAGNOSIS — Z9889 Other specified postprocedural states: Secondary | ICD-10-CM

## 2024-08-12 ENCOUNTER — Other Ambulatory Visit

## 2024-08-13 ENCOUNTER — Encounter (HOSPITAL_COMMUNITY): Payer: Self-pay

## 2024-08-13 ENCOUNTER — Encounter (HOSPITAL_COMMUNITY)

## 2024-08-14 ENCOUNTER — Ambulatory Visit
Admission: RE | Admit: 2024-08-14 | Discharge: 2024-08-14 | Disposition: A | Discharge status: Home / Self Care | Source: Ambulatory Visit | Attending: Surgery | Admitting: Surgery

## 2024-08-14 DIAGNOSIS — R1012 Left upper quadrant pain: Secondary | ICD-10-CM

## 2024-08-14 DIAGNOSIS — R140 Abdominal distension (gaseous): Secondary | ICD-10-CM

## 2024-08-14 DIAGNOSIS — Z9889 Other specified postprocedural states: Secondary | ICD-10-CM

## 2024-08-14 MED ORDER — IOPAMIDOL (ISOVUE-370) INJECTION 76%
75.0000 mL | Freq: Once | INTRAVENOUS | Status: AC | PRN
  Administered 2024-08-14: 75 mL via INTRAVENOUS

## 2024-08-15 ENCOUNTER — Ambulatory Visit: Payer: Self-pay | Admitting: Surgery

## 2024-11-01 ENCOUNTER — Telehealth (HOSPITAL_COMMUNITY): Admitting: Psychiatry

## 2024-11-18 ENCOUNTER — Telehealth (HOSPITAL_COMMUNITY): Payer: Self-pay

## 2024-11-18 NOTE — Telephone Encounter (Signed)
 Pt called in inquiring about if hydrOXYzine  (VISTARIL ) 100 MG capsule can be changed to another medication due to insurance not doing the teir extension anymore and the medication is $75. Please advise pt scheduled 12/09/24.

## 2024-11-18 NOTE — Telephone Encounter (Signed)
 She could try benadryl  which is similar and available over the counter

## 2024-11-18 NOTE — Telephone Encounter (Signed)
Spoke with pt advised of Dr Charlott Rakes message she verbalized understanding

## 2024-11-26 ENCOUNTER — Telehealth (HOSPITAL_COMMUNITY): Payer: Self-pay

## 2024-11-26 NOTE — Telephone Encounter (Signed)
 Prior authorization for pt's Hydroxyzine  Pamoate Capsule tier change request approved

## 2024-12-05 ENCOUNTER — Telehealth (HOSPITAL_COMMUNITY): Admitting: Psychiatry

## 2024-12-05 ENCOUNTER — Encounter (HOSPITAL_COMMUNITY): Payer: Self-pay | Admitting: Psychiatry

## 2024-12-05 DIAGNOSIS — F332 Major depressive disorder, recurrent severe without psychotic features: Secondary | ICD-10-CM

## 2024-12-05 MED ORDER — ZOLPIDEM TARTRATE 10 MG PO TABS: 10.0000 mg | ORAL_TABLET | Freq: Every day | ORAL | 3 refills | Status: AC

## 2024-12-05 MED ORDER — ALPRAZOLAM 1 MG PO TABS: 1.0000 mg | ORAL_TABLET | Freq: Three times a day (TID) | ORAL | 1 refills | Status: AC | PRN

## 2024-12-05 MED ORDER — CYCLOBENZAPRINE HCL 10 MG PO TABS: 10.0000 mg | ORAL_TABLET | Freq: Every day | ORAL | 2 refills | Status: AC

## 2024-12-05 MED ORDER — DESVENLAFAXINE SUCCINATE ER 100 MG PO TB24: 100.0000 mg | ORAL_TABLET | Freq: Every day | ORAL | 3 refills | Status: AC

## 2024-12-05 MED ORDER — HYDROXYZINE PAMOATE 100 MG PO CAPS: 100.0000 mg | ORAL_CAPSULE | Freq: Every day | ORAL | 2 refills | Status: AC

## 2024-12-05 NOTE — Progress Notes (Signed)
 -Virtual Visit via Video Note  I connected with [name] on 12/05/24 at  1:00 PM EST by a video enabled telemedicine application and verified that I am speaking with the correct person using two identifiers.  Location: Patient: home Provider: office   I discussed the limitations of evaluation and management by telemedicine and the availability of in person appointments. The patient expressed understanding and agreed to proceed.      I discussed the assessment and treatment plan with the patient. The patient was provided an opportunity to ask questions and all were answered. The patient agreed with the plan and demonstrated an understanding of the instructions.   The patient was advised to call back or seek an in-person evaluation if the symptoms worsen or if the condition fails to improve as anticipated.  I provided 20 minutes of non-face-to-face time during this encounter.   Barnie Gull, MD  [facility] MD/PA/NP OP Progress Note  12/05/2024 1:23 PM [name]  MRN:  [id number]  Chief Complaint:  Chief Complaint  Patient presents with   Depression   Anxiety   Follow-up   HPI:  This patient is a 59 year old divorced white female who lives alone in [place] . She is on disability.   The patient returns for 4 months regarding her major depression and generalized anxiety disorder as well as insomnia.  She states that overall she is doing about the same.  She is much more worried about finances.  She states that the company giving her insulin  for free can no longer do this because her Medicare program will allow it.  She is very worried about paying for the insulin .  She states this sort of thing keeps her awake at night.  She also did not get her eye hydroxyzine  from the mail-order pharmacy for about a month.  They claim they needed refills but we had already sent them in.  The patient as usual is stressed but denies serious depression or thoughts of self-harm.  I am hoping  that if we can get the hydroxyzine  reinstated that she will sleep better. Visit Diagnosis:    ICD-10-CM   1. Severe episode of recurrent major depressive disorder, without psychotic features (HCC)  F33.2       Past Psychiatric History: Long-term outpatient treatment  Past Medical History:  Past Medical History:  Diagnosis Date   Anxiety    Arthritis    Asthma    Chronic kidney disease    IGA   Complication of anesthesia    Depression    Diabetes mellitus without complication (HCC)    type II    GERD (gastroesophageal reflux disease)    Heart murmur    since birth- no need to be concern   History of hiatal hernia    History of kidney stones    hx of    Hypertension    PONV (postoperative nausea and vomiting)    Shortness of breath    With exertion   Sleep apnea    severe sleep apnea uses oxygen 2l at nite     Past Surgical History:  Procedure Laterality Date   ABDOMINAL HYSTERECTOMY     BREAST SURGERY Right    biospy x2   CATARACT EXTRACTION     right eye   devaited spetum surgery      DILATION AND CURETTAGE OF UTERUS     ESOPHAGOGASTRODUODENOSCOPY (EGD) WITH PROPOFOL  N/A 12/26/2016   Procedure: ESOPHAGOGASTRODUODENOSCOPY (EGD) WITH PROPOFOL ;  Surgeon: Donnice Lunger, MD;  Location: [facility] ENDOSCOPY;  Service: General;  Laterality: N/A;   ESOPHAGOGASTRODUODENOSCOPY (EGD) WITH PROPOFOL  N/A 12/07/2017   Procedure: ESOPHAGOGASTRODUODENOSCOPY (EGD) WITH PROPOFOL ;  Surgeon: Celestia Agent, MD;  Location: [facility] ENDOSCOPY;  Service: Endoscopy;  Laterality: N/A;   EYE SURGERY  04/23/2019   right,Pupil is dilated   LAPAROSCOPIC NISSEN FUNDOPLICATION N/A 01/16/2017   Procedure: LAPAROSCOPIC ENTEROLYSIS TAKEDOWN OF PRIOR NISSEN FUNDOPLICATION WITH UPPER ENDOSCOPY;  Surgeon: Donnice Lunger, MD;  Location: [facility] ORS;  Service: General;  Laterality: N/A;   LUMBAR LAMINECTOMY/DECOMPRESSION MICRODISCECTOMY  10/23/2013   L4 L5   DR BURNETTA   LUMBAR LAMINECTOMY/DECOMPRESSION MICRODISCECTOMY N/A  10/23/2013   Procedure: DECOMPRESSION AND FACET REMOVAL L4 - L5 1 LEVEL;  Surgeon: Donaciano Burnetta, MD;  Location: [facility] OR;  Service: Orthopedics;  Laterality: N/A;   nasal repair collapse      both sides of nose and 2 plates added    NASAL SINUS SURGERY     NISSEN FUNDOPLICATION     right foot surgery      SEPTOPLASTY  2013   SHOULDER ARTHROSCOPY WITH ROTATOR CUFF REPAIR Right    x 2   SHOULDER ARTHROSCOPY WITH ROTATOR CUFF REPAIR Left 05/07/2020   Procedure: Left shoulder arthroscopy, subacromial decompression, distal clavicle resection, rotator cuff repair;  Surgeon: Melita Drivers, MD;  Location: [facility] ORS;  Service: Orthopedics;  Laterality: Left;    WRIST SURGERY Left    work injury    Family Psychiatric History: See below  Family History:  Family History  Problem Relation Age of Onset   Alcohol abuse Brother    Drug abuse Other    Stroke Mother    Hypertension Mother    Diabetes Mother    Kidney disease Mother    Lung cancer Father    Heart attack Sister    Lung cancer Brother     Social History:  Social History   Socioeconomic History   Marital status: Single    Spouse name: Not on file   Number of children: Not on file   Years of education: Not on file   Highest education level: Not on file  Occupational History   Not on file  Tobacco Use   Smoking status: Never   Smokeless tobacco: Never  Vaping Use   Vaping status: Never Used  Substance and Sexual Activity   Alcohol use: No   Drug use: No   Sexual activity: Never  Other Topics Concern   Not on file  Social History Narrative   Not on file   Social Drivers of Health   Tobacco Use: Low Risk (12/05/2024)   Patient History    Smoking Tobacco Use: Never    Smokeless Tobacco Use: Never    Passive Exposure: Not on file  Financial Resource Strain: Not on file  Food Insecurity: Not on file  Transportation Needs: Not on file  Physical Activity: Not on file  Stress: Not on file  Social Connections:  Unknown (03/27/2022)   [facility] from [facility]   Social Network    Social Network: Not on file  Depression (PHQ2-9): Medium Risk (06/27/2022)   Depression (PHQ2-9)    PHQ-2 Score: 9  Alcohol Screen: Not on file  Housing: Unknown (12/21/2023)   Received from [facility]   Epic    Unable to Pay for Housing in the Last Year: Not on file    Number of Times Moved in the Last Year: Not on file    At any time in the  past 12 months, were you homeless or living in a shelter (including now)?: No  Utilities: Not on file  Health Literacy: Not on file    Allergies: Allergies[1]  Metabolic Disorder Labs: Lab Results  Component Value Date   HGBA1C 6.1 (H) 05/04/2020   MPG 128 05/04/2020   MPG 137 11/25/2016   No results found for: PROLACTIN No results found for: CHOL, TRIG, HDL, CHOLHDL, VLDL, LDLCALC No results found for: TSH  Therapeutic Level Labs: No results found for: LITHIUM No results found for: VALPROATE No results found for: CBMZ  Current Medications: Current Outpatient Medications  Medication Sig Dispense Refill   albuterol  (PROVENTIL  HFA;VENTOLIN  HFA) 108 (90 Base) MCG/ACT inhaler Inhale 2 puffs into the lungs every 4 (four) hours as needed for wheezing or shortness of breath.     allopurinol  (ZYLOPRIM ) 100 MG tablet Take 100 mg by mouth daily with breakfast.      ALPRAZolam  (XANAX ) 1 MG tablet Take 1 tablet (1 mg total) by mouth 3 (three) times daily as needed for anxiety. 270 tablet 1   Ascorbic Acid (VITAMIN C) 1000 MG tablet Take 1,000 mg by mouth daily.     Cholecalciferol (VITAMIN D-3) 5000 units TABS Take 5,000 Units by mouth daily.     cyclobenzaprine  (FLEXERIL ) 10 MG tablet Take 1 tablet (10 mg total) by mouth at bedtime. 90 tablet 2   desvenlafaxine  (PRISTIQ ) 100 MG 24 hr tablet Take 1 tablet (100 mg total) by mouth daily. 90 tablet 3   diazepam (VALIUM) 2 MG tablet Take 2 mg by mouth as needed.     folic acid (FOLVITE) 1 MG  tablet Take 1 mg by mouth 2 (two) times daily.     furosemide  (LASIX ) 20 MG tablet Take 20 mg by mouth daily.     hydrOXYzine  (VISTARIL ) 100 MG capsule Take 1 capsule (100 mg total) by mouth at bedtime. 90 capsule 2   KLOR-CON  M20 20 MEQ tablet Take 20 mEq by mouth 3 (three) times daily.     latanoprost (XALATAN) 0.005 % ophthalmic solution Apply 1 drop to eye at bedtime.     levocetirizine (XYZAL ) 5 MG tablet Take 5 mg by mouth at bedtime.     linaclotide (LINZESS) 290 MCG CAPS capsule Take 290 mcg by mouth daily as needed (constipation).      meloxicam (MOBIC) 15 MG tablet Take 1 tablet by mouth daily as needed.     metFORMIN  (GLUCOPHAGE -XR) 500 MG 24 hr tablet Take 500 mg by mouth daily with supper.     metoprolol  succinate (TOPROL -XL) 100 MG 24 hr tablet Take 100 mg by mouth daily. Take with or immediately following a meal.     ondansetron  (ZOFRAN ) 4 MG tablet Take 4 mg by mouth every 8 (eight) hours as needed for nausea or vomiting.     OXYGEN Inhale 2 L into the lungs at bedtime.     pantoprazole  (PROTONIX ) 40 MG tablet Take 40 mg by mouth 2 (two) times daily.      prednisoLONE acetate (PRED FORTE) 1 % ophthalmic suspension Place 1 drop into the right eye in the morning and at bedtime.      Semaglutide,0.25 or 0.5MG /DOS, (OZEMPIC, 0.25 OR 0.5 MG/DOSE,) 2 MG/3ML SOPN once a week.     spironolactone -hydrochlorothiazide  (ALDACTAZIDE ) 25-25 MG tablet Take 1 tablet by mouth daily.     timolol (TIMOPTIC) 0.5 % ophthalmic solution Place 1 drop into the right eye 2 (two) times daily.     zinc gluconate  50 MG tablet Take 50 mg by mouth daily.     zolpidem  (AMBIEN ) 10 MG tablet Take 1 tablet (10 mg total) by mouth at bedtime. 90 tablet 3   No current facility-administered medications for this visit.     Musculoskeletal: Strength & Muscle Tone: within normal limits Gait & Station: normal Patient leans: N/A  Psychiatric Specialty Exam: Review of Systems  Eyes:  Positive for visual  disturbance.  Musculoskeletal:  Positive for back pain.  Psychiatric/Behavioral:  Positive for sleep disturbance.   All other systems reviewed and are negative.   There were no vitals taken for this visit.There is no height or weight on file to calculate BMI.  General Appearance: Casual and Fairly Groomed  Eye Contact:  Good  Speech:  Clear and Coherent  Volume:  Normal  Mood:  Anxious  Affect:  Flat  Thought Process:  Goal Directed  Orientation:  Full (Time, Place, and Person)  Thought Content: Rumination   Suicidal Thoughts:  No  Homicidal Thoughts:  No  Memory:  Immediate;   Good Recent;   Good Remote;   NA  Judgement:  Good  Insight:  Fair  Psychomotor Activity:  Decreased  Concentration:  Concentration: Good and Attention Span: Good  Recall:  Good  Fund of Knowledge: Good  Language: Good  Akathisia:  No  Handed:  Right  AIMS (if indicated): not done  Assets:  Communication Skills Desire for Improvement Resilience Social Support  ADL's:  Intact  Cognition: WNL  Sleep:  Poor   Screenings: PHQ2-9    Flowsheet Row Video Visit from 06/27/2022 in [facility] [place] Video Visit from 02/23/2022 in [facility] [place] Video Visit from 11/22/2021 in [facility] at [place] Video Visit from 09/22/2021 in [facility] at [place] Video Visit from 06/09/2021 in [facility] at [facility] Total Score 2 2 2 5 5   PHQ-9 Total Score 9 8 8 12 18    Flowsheet Row Video Visit from 09/22/2021 in [facility] at [place] Video Visit from 06/09/2021 in [facility] at [place] Video Visit from 03/15/2021 in [facility] at [place]  C-SSRS RISK CATEGORY No Risk No Risk No Risk     Assessment and Plan: This patient is a 59 year old female with a  history of chronic dysthymia major depression insomnia and generalized anxiety disorder.  She has not been able to get all of her medications and hence is not sleeping that well.  She will continue Pristiq  100 mg daily for major depression, hydroxyzine  100 mg at bedtime as well as Ambien  10 mg at bedtime for sleep, Xanax  1 mg up to 3 times daily for anxiety and tizanidine 10 mg at bedtime as needed for muscle spasm she will return to see me in 4 months  Collaboration of Care: Collaboration of Care: Primary Care Provider AEB notes will be shared with PCP at patient's request  Patient/Guardian was advised Release of Information must be obtained prior to any record release in order to collaborate their care with an outside provider. Patient/Guardian was advised if they have not already done so to contact the registration department to sign all necessary forms in order for us  to release information regarding their care.   Consent: Patient/Guardian gives verbal consent for treatment and assignment of benefits for services provided during this visit. Patient/Guardian expressed understanding and agreed to  proceed.    Barnie Gull, MD 12/05/2024, 1:23 PM     [1]  Allergies Allergen Reactions   Polymyxin B-Trimethoprim Other (See Comments), Swelling and Itching   Bacitracin Rash   Adhesive [Tape] Other (See Comments)    Makes skin pull off *Band-aids and Plastic tape pls use paper tape   Aspirin Other (See Comments)    Gastroparesis and kidney issues   AVOIDS   Gastroparesis and kidney issues  AVOIDS  aluminum aspirin  aspirin   Benzalkonium Chloride Dermatitis   Cephalosporins Dermatitis, Itching and Other (See Comments)   Ciprofloxacin Other (See Comments)   Codeine Hives and Itching   Polymyxin B Other (See Comments)   Prednisone Other (See Comments)    Makes me antsy and heart racing   Makes me antsy and heart racing  prednisone   Silicone Other (See Comments)   Macrobid  [Nitrofurantoin Monohyd Macro] Hives and Itching   Neosporin [Neomycin-Bacitracin Zn-Polymyx] Rash

## 2024-12-09 ENCOUNTER — Other Ambulatory Visit (HOSPITAL_COMMUNITY): Payer: Self-pay | Admitting: Psychiatry

## 2024-12-09 ENCOUNTER — Telehealth (HOSPITAL_COMMUNITY): Admitting: Psychiatry

## 2025-03-18 ENCOUNTER — Telehealth (HOSPITAL_COMMUNITY): Admitting: Psychiatry
